# Patient Record
Sex: Female | Born: 1965 | Race: Black or African American | Hispanic: No | Marital: Single | Smoking: Never smoker
Health system: Southern US, Community
[De-identification: ages and names within clinical notes are randomized; demographics above are authoritative.]

## PROBLEM LIST (undated history)

## (undated) DIAGNOSIS — E039 Hypothyroidism, unspecified: Secondary | ICD-10-CM

## (undated) DIAGNOSIS — E119 Type 2 diabetes mellitus without complications: Secondary | ICD-10-CM

## (undated) DIAGNOSIS — E079 Disorder of thyroid, unspecified: Secondary | ICD-10-CM

## (undated) DIAGNOSIS — I639 Cerebral infarction, unspecified: Secondary | ICD-10-CM

## (undated) DIAGNOSIS — I10 Essential (primary) hypertension: Secondary | ICD-10-CM

## (undated) DIAGNOSIS — E785 Hyperlipidemia, unspecified: Secondary | ICD-10-CM

---

## 1998-01-31 ENCOUNTER — Encounter: Admission: RE | Admit: 1998-01-31 | Discharge: 1998-01-31 | Payer: Self-pay | Admitting: Hematology and Oncology

## 1998-03-06 ENCOUNTER — Encounter: Admission: RE | Admit: 1998-03-06 | Discharge: 1998-03-06 | Payer: Self-pay | Admitting: Internal Medicine

## 1998-04-13 ENCOUNTER — Encounter: Admission: RE | Admit: 1998-04-13 | Discharge: 1998-04-13 | Payer: Self-pay | Admitting: Internal Medicine

## 1998-06-24 ENCOUNTER — Emergency Department (HOSPITAL_COMMUNITY): Admission: EM | Admit: 1998-06-24 | Discharge: 1998-06-24 | Payer: Self-pay | Admitting: *Deleted

## 1998-08-15 ENCOUNTER — Encounter: Admission: RE | Admit: 1998-08-15 | Discharge: 1998-08-15 | Payer: Self-pay | Admitting: Internal Medicine

## 1998-11-27 ENCOUNTER — Encounter: Admission: RE | Admit: 1998-11-27 | Discharge: 1998-11-27 | Payer: Self-pay | Admitting: Internal Medicine

## 1998-12-03 ENCOUNTER — Encounter: Admission: RE | Admit: 1998-12-03 | Discharge: 1998-12-03 | Payer: Self-pay | Admitting: Internal Medicine

## 1999-01-14 ENCOUNTER — Encounter: Admission: RE | Admit: 1999-01-14 | Discharge: 1999-01-14 | Payer: Self-pay | Admitting: Internal Medicine

## 1999-01-30 ENCOUNTER — Encounter: Admission: RE | Admit: 1999-01-30 | Discharge: 1999-01-30 | Payer: Self-pay | Admitting: Internal Medicine

## 2000-01-15 ENCOUNTER — Other Ambulatory Visit: Admission: RE | Admit: 2000-01-15 | Discharge: 2000-01-15 | Payer: Self-pay | Admitting: Obstetrics and Gynecology

## 2000-08-18 ENCOUNTER — Encounter: Admission: RE | Admit: 2000-08-18 | Discharge: 2000-08-18 | Payer: Self-pay | Admitting: Internal Medicine

## 2001-02-01 ENCOUNTER — Other Ambulatory Visit: Admission: RE | Admit: 2001-02-01 | Discharge: 2001-02-01 | Payer: Self-pay | Admitting: Obstetrics and Gynecology

## 2001-08-17 ENCOUNTER — Encounter: Payer: Self-pay | Admitting: Emergency Medicine

## 2001-08-17 ENCOUNTER — Emergency Department (HOSPITAL_COMMUNITY): Admission: EM | Admit: 2001-08-17 | Discharge: 2001-08-18 | Payer: Self-pay | Admitting: Emergency Medicine

## 2001-11-09 ENCOUNTER — Encounter: Admission: RE | Admit: 2001-11-09 | Discharge: 2001-11-09 | Payer: Self-pay | Admitting: Internal Medicine

## 2002-05-26 ENCOUNTER — Other Ambulatory Visit: Admission: RE | Admit: 2002-05-26 | Discharge: 2002-05-26 | Payer: Self-pay | Admitting: Obstetrics and Gynecology

## 2002-06-30 ENCOUNTER — Encounter: Payer: Self-pay | Admitting: Emergency Medicine

## 2002-06-30 ENCOUNTER — Emergency Department (HOSPITAL_COMMUNITY): Admission: EM | Admit: 2002-06-30 | Discharge: 2002-06-30 | Payer: Self-pay | Admitting: Emergency Medicine

## 2003-01-17 ENCOUNTER — Ambulatory Visit (HOSPITAL_COMMUNITY): Admission: RE | Admit: 2003-01-17 | Discharge: 2003-01-17 | Payer: Self-pay | Admitting: Family Medicine

## 2003-01-17 ENCOUNTER — Encounter: Payer: Self-pay | Admitting: Family Medicine

## 2003-04-21 ENCOUNTER — Emergency Department (HOSPITAL_COMMUNITY): Admission: EM | Admit: 2003-04-21 | Discharge: 2003-04-22 | Payer: Self-pay | Admitting: Emergency Medicine

## 2003-04-22 ENCOUNTER — Encounter: Payer: Self-pay | Admitting: Emergency Medicine

## 2003-10-10 ENCOUNTER — Other Ambulatory Visit: Admission: RE | Admit: 2003-10-10 | Discharge: 2003-10-10 | Payer: Self-pay | Admitting: Obstetrics and Gynecology

## 2004-03-22 ENCOUNTER — Emergency Department (HOSPITAL_COMMUNITY): Admission: EM | Admit: 2004-03-22 | Discharge: 2004-03-23 | Payer: Self-pay | Admitting: Emergency Medicine

## 2004-10-02 ENCOUNTER — Ambulatory Visit: Payer: Self-pay | Admitting: Internal Medicine

## 2004-10-15 ENCOUNTER — Ambulatory Visit: Payer: Self-pay | Admitting: Internal Medicine

## 2005-09-08 ENCOUNTER — Emergency Department (HOSPITAL_COMMUNITY): Admission: EM | Admit: 2005-09-08 | Discharge: 2005-09-08 | Payer: Self-pay | Admitting: Emergency Medicine

## 2007-03-04 ENCOUNTER — Emergency Department (HOSPITAL_COMMUNITY): Admission: EM | Admit: 2007-03-04 | Discharge: 2007-03-04 | Payer: Self-pay | Admitting: Emergency Medicine

## 2009-03-13 ENCOUNTER — Encounter (HOSPITAL_COMMUNITY): Admission: RE | Admit: 2009-03-13 | Discharge: 2009-05-10 | Payer: Self-pay | Admitting: Internal Medicine

## 2010-12-30 ENCOUNTER — Ambulatory Visit: Payer: Self-pay | Admitting: Dietician

## 2011-01-07 ENCOUNTER — Encounter: Payer: BC Managed Care – PPO | Attending: Family Medicine | Admitting: *Deleted

## 2011-01-07 DIAGNOSIS — Z713 Dietary counseling and surveillance: Secondary | ICD-10-CM | POA: Insufficient documentation

## 2011-01-07 DIAGNOSIS — E119 Type 2 diabetes mellitus without complications: Secondary | ICD-10-CM | POA: Insufficient documentation

## 2011-02-03 ENCOUNTER — Encounter: Payer: BC Managed Care – PPO | Attending: Family Medicine | Admitting: *Deleted

## 2011-02-03 DIAGNOSIS — E119 Type 2 diabetes mellitus without complications: Secondary | ICD-10-CM | POA: Insufficient documentation

## 2011-02-03 DIAGNOSIS — Z713 Dietary counseling and surveillance: Secondary | ICD-10-CM | POA: Insufficient documentation

## 2011-05-26 LAB — URINALYSIS, ROUTINE W REFLEX MICROSCOPIC
Bilirubin Urine: NEGATIVE
Glucose, UA: NEGATIVE
Ketones, ur: NEGATIVE
Nitrite: NEGATIVE
Protein, ur: NEGATIVE
Specific Gravity, Urine: 1.016
Urobilinogen, UA: 1
pH: 5.5

## 2011-05-26 LAB — PREGNANCY, URINE: Preg Test, Ur: NEGATIVE

## 2011-05-26 LAB — URINE MICROSCOPIC-ADD ON

## 2011-08-07 ENCOUNTER — Encounter: Payer: BC Managed Care – PPO | Attending: Family Medicine | Admitting: Dietician

## 2011-08-07 ENCOUNTER — Encounter: Payer: Self-pay | Admitting: Dietician

## 2011-08-07 DIAGNOSIS — E119 Type 2 diabetes mellitus without complications: Secondary | ICD-10-CM | POA: Insufficient documentation

## 2011-08-07 DIAGNOSIS — E039 Hypothyroidism, unspecified: Secondary | ICD-10-CM | POA: Insufficient documentation

## 2011-08-07 DIAGNOSIS — Z713 Dietary counseling and surveillance: Secondary | ICD-10-CM | POA: Insufficient documentation

## 2011-08-07 NOTE — Patient Instructions (Signed)
   Since you have no hypertensive issues, you can use the regular Lance PB crackers.  Try taking the [facility] after meals.   Try to have 1000 to 1200 calories per day.  Carbohydrate 120-125 gm per day.  Protein 80-85 gm per day.  Fat 30-32 gm per day. Based on 1100 calories per day.  Talk to your Aunt's MD regarding seeing a social worker to help with caring for her and getting the assistance of her children.    30 gm of carb at each meal and 15-30 for snacks   Plan to follow-up with Maggie in 8-12 weeks.

## 2011-08-07 NOTE — Progress Notes (Signed)
Medical Nutrition Therapy:  Appt start time: 1500 end time:  1600.  Assessment:  Primary concerns today:  Blood glucose levels are going down but not able to loose weight to help with lowering glucose even more.  Has seen RD at [facility] in the past and has covered carb counting and label reading.  She has been keeping a diary of calories/carbs and limiting herself to a very strict intake of both.  MD is concerned that she is not eating enough to keep her metabolic rate up.   SOCIAL:  Currently is caring for her Aunt (mom's older sister) who has dementia and was left on her door step by the woman's son.  Her four female cousins have essentially left the care of their demented Mother to Zanaria and her mother.  Chenise's dad has had a stroke and his care takes up the majority of her mother's time.  Kamille is currently taking the Aunt to her MD on 08/11/11 and is going to approach the MD as to how they can get a Child psychotherapist involved and perhaps she can help with getting the cooperation of the woman's 4 boys in seeing for her care.  Zaeda is unable to sleep soundly at night for fear that the woman Kain Milosevic leave the house.  Add to this the fact that she is going through menopause and having multiple hot flashes. She is under a great deal of stress which is also helping to limit weight loss and glucose control.  MEDICATIONS:Synthroid and Jentadueto  DIETARY INTAKE:  24-hr recall:  B (6:00 AM): [place] sausage, 1 slice; fried egg, 1; whole wheat toast, 1; coffee, 1/2 cup and water  Snk (10:00 AM) : Pack of organic, low sodium crackers, or peanuts  L (12:00 PM): Baked chicken wing, 1.5-2.0 oz (maybe), broccoli steamed 1 cup; 1/2 tangerine, water  Snk (mid PM): nothing D (6:30 PM): Baked chicken wing, 1.5-2.0 oz; broccoli 1 cup, and pinto beans, 1/4 cup.  Snk (bedto,e PM): none does not eat after 7:00PM Beverages: water (about 32 oz per day) and coffee  This recall is calculated at approximately 675 to 700 calories for  one day.  Her carbs counting the non-starchy veggies is at 75 gm for the day.  Recent physical activity: On her days off when her Mom is looking after her Aunt, she will exercise for 60 minutes in the AM and 60 minutes in the PM.  On work days, it is very hard to find the time to safely exercise.    Estimated energy needs:1000-1200 will aim to try to get her to 1100 calories per day 1000-1200 calories 120-125  g carbohydrates 80-85 g protein 30-32 g fat  Progress Towards Goal(s):  In progress.   Nutritional Diagnosis:  [place]-2.1 Inpaired nutrition utilization As related to glucose.  As evidenced by A1C of 8.5% and continued increased blood glucose and lack of weight loss..    Intervention:  Nutrition Working with her to try to get her caloric and carb intake increased.  This I feel will help with her weight loss.  She is anxious to loose the 10 lb that Dr. Parke Simmers wants her to lose by February.  She is to try the new recommendations and to come and periodically weigh herself here at the center.  Handouts given during visit include:  Novo Nordisk Carbohydrate Counting Guide  Yellow card with the 15-30 gm of carb per meal and 15-30 gm recommendation for snack and the protein list and recommendation for  increasing to 80-85 gm per meal and some with every snack.  Booklet, "Living with Diabetes" by the ADA.  Snack list containing carb and protein sources.  Monitoring/Evaluation:  Dietary intake, exercise, blood glucose levels, and body weight in 8-12 weeks and to call or e-mail in the meantime if she has questions.

## 2011-09-18 ENCOUNTER — Other Ambulatory Visit: Payer: Self-pay | Admitting: Orthopedic Surgery

## 2011-09-18 DIAGNOSIS — M25512 Pain in left shoulder: Secondary | ICD-10-CM

## 2011-09-29 ENCOUNTER — Ambulatory Visit
Admission: RE | Admit: 2011-09-29 | Discharge: 2011-09-29 | Disposition: A | Discharge status: Home / Self Care | Payer: BC Managed Care – PPO | Source: Ambulatory Visit | Attending: Orthopedic Surgery | Admitting: Orthopedic Surgery

## 2011-09-29 DIAGNOSIS — M25512 Pain in left shoulder: Secondary | ICD-10-CM

## 2012-05-31 ENCOUNTER — Encounter (HOSPITAL_COMMUNITY): Payer: Self-pay | Admitting: Family Medicine

## 2012-05-31 ENCOUNTER — Emergency Department (HOSPITAL_COMMUNITY)
Admission: EM | Admit: 2012-05-31 | Discharge: 2012-05-31 | Disposition: A | Discharge status: Home / Self Care | Payer: BC Managed Care – PPO | Attending: Emergency Medicine | Admitting: Emergency Medicine

## 2012-05-31 ENCOUNTER — Emergency Department (HOSPITAL_COMMUNITY): Payer: BC Managed Care – PPO

## 2012-05-31 DIAGNOSIS — R079 Chest pain, unspecified: Secondary | ICD-10-CM | POA: Insufficient documentation

## 2012-05-31 DIAGNOSIS — Z79899 Other long term (current) drug therapy: Secondary | ICD-10-CM | POA: Insufficient documentation

## 2012-05-31 DIAGNOSIS — S0990XA Unspecified injury of head, initial encounter: Secondary | ICD-10-CM

## 2012-05-31 DIAGNOSIS — Y9389 Activity, other specified: Secondary | ICD-10-CM | POA: Insufficient documentation

## 2012-05-31 DIAGNOSIS — E119 Type 2 diabetes mellitus without complications: Secondary | ICD-10-CM | POA: Insufficient documentation

## 2012-05-31 DIAGNOSIS — M25579 Pain in unspecified ankle and joints of unspecified foot: Secondary | ICD-10-CM | POA: Insufficient documentation

## 2012-05-31 DIAGNOSIS — E079 Disorder of thyroid, unspecified: Secondary | ICD-10-CM | POA: Insufficient documentation

## 2012-05-31 HISTORY — DX: Type 2 diabetes mellitus without complications: E11.9

## 2012-05-31 HISTORY — DX: Disorder of thyroid, unspecified: E07.9

## 2012-05-31 LAB — CBC WITH DIFFERENTIAL/PLATELET
Basophils Absolute: 0 10*3/uL (ref 0.0–0.1)
Basophils Relative: 1 % (ref 0–1)
Eosinophils Relative: 3 % (ref 0–5)
HCT: 37 % (ref 36.0–46.0)
Hemoglobin: 12.3 g/dL (ref 12.0–15.0)
MCHC: 33.2 g/dL (ref 30.0–36.0)
MCV: 74.6 fL — ABNORMAL LOW (ref 78.0–100.0)
Monocytes Absolute: 0.4 10*3/uL (ref 0.1–1.0)
Monocytes Relative: 7 % (ref 3–12)
Neutro Abs: 3.3 10*3/uL (ref 1.7–7.7)
RDW: 15.1 % (ref 11.5–15.5)

## 2012-05-31 LAB — COMPREHENSIVE METABOLIC PANEL
Albumin: 3.7 g/dL (ref 3.5–5.2)
BUN: 7 mg/dL (ref 6–23)
CO2: 29 mEq/L (ref 19–32)
Calcium: 9.4 mg/dL (ref 8.4–10.5)
Chloride: 99 mEq/L (ref 96–112)
Creatinine, Ser: 0.79 mg/dL (ref 0.50–1.10)
GFR calc non Af Amer: >90 mL/min (ref >90)
Total Bilirubin: 0.4 mg/dL (ref 0.3–1.2)

## 2012-05-31 LAB — GLUCOSE, CAPILLARY: Glucose-Capillary: 199 mg/dL — ABNORMAL HIGH (ref 70–99)

## 2012-05-31 LAB — TROPONIN I: Troponin I: <0.3 ng/mL (ref <0.30)

## 2012-05-31 MED ORDER — OXYCODONE-ACETAMINOPHEN 5-325 MG PO TABS
1.0000 | ORAL_TABLET | Freq: Once | ORAL | Status: AC
  Administered 2012-05-31: 1 via ORAL
  Filled 2012-05-31: qty 1

## 2012-05-31 MED ORDER — OXYCODONE-ACETAMINOPHEN 5-325 MG PO TABS: 2.0000 | ORAL_TABLET | ORAL | Status: DC | PRN

## 2012-05-31 MED ORDER — IBUPROFEN 800 MG PO TABS
800.0000 mg | ORAL_TABLET | Freq: Once | ORAL | Status: AC
  Administered 2012-05-31: 800 mg via ORAL
  Filled 2012-05-31: qty 1

## 2012-05-31 NOTE — ED Notes (Signed)
Paged ortho 
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
## 2012-05-31 NOTE — Progress Notes (Signed)
Orthopedic Tech Progress Note Patient Details:  Amy Raymond [birth year] [id number]  Ortho Devices Type of Ortho Device: Crutches;Ankle Air splint Ortho Device/Splint Location: right ankle Ortho Device/Splint Interventions: Application   Amy Raymond 05/31/2012, 2:01 PM

## 2012-05-31 NOTE — Progress Notes (Signed)
Orthopedic Tech Progress Note Patient Details:  Amy Raymond 28-Feb-1966 [id number]  Patient ID: Amy Raymond, female   DOB: Dec 19, 1965, 46 y.o.   MRN: [id number] Viewed order from doctor's order list  Nikki Dom 05/31/2012, 2:02 PM
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
## 2014-09-09 ENCOUNTER — Encounter: Payer: Self-pay | Admitting: Internal Medicine

## 2015-03-28 ENCOUNTER — Encounter: Payer: Self-pay | Admitting: Internal Medicine

## 2017-06-03 ENCOUNTER — Other Ambulatory Visit: Payer: Self-pay | Admitting: Nurse Practitioner

## 2017-06-03 DIAGNOSIS — Z780 Asymptomatic menopausal state: Secondary | ICD-10-CM

## 2018-05-11 ENCOUNTER — Other Ambulatory Visit: Payer: Self-pay

## 2019-09-10 ENCOUNTER — Encounter (HOSPITAL_COMMUNITY): Payer: Self-pay

## 2019-09-10 ENCOUNTER — Emergency Department (HOSPITAL_COMMUNITY): Payer: BLUE CROSS/BLUE SHIELD

## 2019-09-10 ENCOUNTER — Inpatient Hospital Stay (HOSPITAL_COMMUNITY)
Admission: EM | Admit: 2019-09-10 | Discharge: 2019-09-13 | DRG: 066 | Disposition: A | Discharge status: Home / Self Care | Payer: BLUE CROSS/BLUE SHIELD | Attending: Internal Medicine | Admitting: Internal Medicine

## 2019-09-10 DIAGNOSIS — R7401 Elevation of levels of liver transaminase levels: Secondary | ICD-10-CM

## 2019-09-10 DIAGNOSIS — Z20822 Contact with and (suspected) exposure to covid-19: Secondary | ICD-10-CM | POA: Diagnosis present

## 2019-09-10 DIAGNOSIS — R748 Abnormal levels of other serum enzymes: Secondary | ICD-10-CM

## 2019-09-10 DIAGNOSIS — E119 Type 2 diabetes mellitus without complications: Secondary | ICD-10-CM

## 2019-09-10 DIAGNOSIS — R4701 Aphasia: Secondary | ICD-10-CM | POA: Diagnosis present

## 2019-09-10 DIAGNOSIS — R519 Headache, unspecified: Secondary | ICD-10-CM | POA: Diagnosis present

## 2019-09-10 DIAGNOSIS — I6340 Cerebral infarction due to embolism of unspecified cerebral artery: Secondary | ICD-10-CM | POA: Diagnosis not present

## 2019-09-10 DIAGNOSIS — Z79899 Other long term (current) drug therapy: Secondary | ICD-10-CM

## 2019-09-10 DIAGNOSIS — D509 Iron deficiency anemia, unspecified: Secondary | ICD-10-CM | POA: Diagnosis present

## 2019-09-10 DIAGNOSIS — Z7984 Long term (current) use of oral hypoglycemic drugs: Secondary | ICD-10-CM

## 2019-09-10 DIAGNOSIS — E785 Hyperlipidemia, unspecified: Secondary | ICD-10-CM | POA: Diagnosis present

## 2019-09-10 DIAGNOSIS — E039 Hypothyroidism, unspecified: Secondary | ICD-10-CM | POA: Diagnosis present

## 2019-09-10 DIAGNOSIS — H543 Unqualified visual loss, both eyes: Secondary | ICD-10-CM | POA: Diagnosis present

## 2019-09-10 DIAGNOSIS — G43109 Migraine with aura, not intractable, without status migrainosus: Secondary | ICD-10-CM | POA: Diagnosis present

## 2019-09-10 DIAGNOSIS — Z79890 Hormone replacement therapy (postmenopausal): Secondary | ICD-10-CM

## 2019-09-10 DIAGNOSIS — E1165 Type 2 diabetes mellitus with hyperglycemia: Secondary | ICD-10-CM | POA: Diagnosis present

## 2019-09-10 DIAGNOSIS — I10 Essential (primary) hypertension: Secondary | ICD-10-CM | POA: Diagnosis present

## 2019-09-10 DIAGNOSIS — I639 Cerebral infarction, unspecified: Secondary | ICD-10-CM | POA: Diagnosis present

## 2019-09-10 LAB — COMPREHENSIVE METABOLIC PANEL
ALT: 105 U/L — ABNORMAL HIGH (ref 0–44)
AST: 139 U/L — ABNORMAL HIGH (ref 15–41)
Albumin: 3.4 g/dL — ABNORMAL LOW (ref 3.5–5.0)
Alkaline Phosphatase: 292 U/L — ABNORMAL HIGH (ref 38–126)
Anion gap: 14 (ref 5–15)
BUN: 10 mg/dL (ref 6–20)
CO2: 23 mmol/L (ref 22–32)
Calcium: 9 mg/dL (ref 8.9–10.3)
Chloride: 100 mmol/L (ref 98–111)
Creatinine, Ser: 0.79 mg/dL (ref 0.44–1.00)
GFR calc Af Amer: >60 mL/min (ref >60)
GFR calc non Af Amer: >60 mL/min (ref >60)
Glucose, Bld: 209 mg/dL — ABNORMAL HIGH (ref 70–99)
Potassium: 5.3 mmol/L — ABNORMAL HIGH (ref 3.5–5.1)
Sodium: 137 mmol/L (ref 135–145)
Total Bilirubin: 1.7 mg/dL — ABNORMAL HIGH (ref 0.3–1.2)
Total Protein: 8 g/dL (ref 6.5–8.1)

## 2019-09-10 LAB — DIFFERENTIAL
Abs Immature Granulocytes: 0.11 10*3/uL — ABNORMAL HIGH (ref 0.00–0.07)
Basophils Absolute: 0 10*3/uL (ref 0.0–0.1)
Basophils Relative: 0 %
Eosinophils Absolute: 0.1 10*3/uL (ref 0.0–0.5)
Eosinophils Relative: 1 %
Immature Granulocytes: 1 %
Lymphocytes Relative: 20 %
Lymphs Abs: 1.7 10*3/uL (ref 0.7–4.0)
Monocytes Absolute: 0.8 10*3/uL (ref 0.1–1.0)
Monocytes Relative: 10 %
Neutro Abs: 5.8 10*3/uL (ref 1.7–7.7)
Neutrophils Relative %: 68 %

## 2019-09-10 LAB — PROTIME-INR
INR: 1 (ref 0.8–1.2)
Prothrombin Time: 13.3 seconds (ref 11.4–15.2)

## 2019-09-10 LAB — CBC
HCT: 34.3 % — ABNORMAL LOW (ref 36.0–46.0)
Hemoglobin: 11 g/dL — ABNORMAL LOW (ref 12.0–15.0)
MCH: 25.1 pg — ABNORMAL LOW (ref 26.0–34.0)
MCHC: 32.1 g/dL (ref 30.0–36.0)
MCV: 78.1 fL — ABNORMAL LOW (ref 80.0–100.0)
Platelets: 462 10*3/uL — ABNORMAL HIGH (ref 150–400)
RBC: 4.39 MIL/uL (ref 3.87–5.11)
RDW: 14.2 % (ref 11.5–15.5)
WBC: 8.5 10*3/uL (ref 4.0–10.5)
nRBC: 0 % (ref 0.0–0.2)

## 2019-09-10 LAB — APTT: aPTT: 28 seconds (ref 24–36)

## 2019-09-10 LAB — I-STAT BETA HCG BLOOD, ED (MC, WL, AP ONLY): I-stat hCG, quantitative: <5 m[IU]/mL (ref <5)

## 2019-09-10 LAB — ETHANOL: Alcohol, Ethyl (B): <10 mg/dL (ref <10)

## 2019-09-10 LAB — CBG MONITORING, ED: Glucose-Capillary: 200 mg/dL — ABNORMAL HIGH (ref 70–99)

## 2019-09-10 MED ORDER — PROCHLORPERAZINE EDISYLATE 10 MG/2ML IJ SOLN
10.0000 mg | Freq: Once | INTRAMUSCULAR | Status: AC
  Administered 2019-09-10: 23:00:00 10 mg via INTRAVENOUS
  Filled 2019-09-10: qty 2

## 2019-09-10 MED ORDER — IOHEXOL 350 MG/ML SOLN
75.0000 mL | Freq: Once | INTRAVENOUS | Status: AC | PRN
  Administered 2019-09-10: 23:00:00 75 mL via INTRAVENOUS

## 2019-09-10 MED ORDER — DIPHENHYDRAMINE HCL 50 MG/ML IJ SOLN
12.5000 mg | Freq: Once | INTRAMUSCULAR | Status: AC
  Administered 2019-09-10: 23:00:00 12.5 mg via INTRAVENOUS
  Filled 2019-09-10: qty 1

## 2019-09-10 NOTE — ED Notes (Signed)
Pt transported to CT ?

## 2019-09-10 NOTE — ED Provider Notes (Signed)
[facility] EMERGENCY DEPARTMENT Provider Note   CSN: [id number] Arrival date & time: 09/10/19  2129     History Chief Complaint  Patient presents with  . Dizziness  . Loss of Consciousness    Amy Raymond is a 54 y.o. female.  HPI   Patient is 54 year old female with history of diabetes, thyroid disease, who presents the emergency department today for evaluation of dizziness, headaches and syncope.  Per patient she had an episode about 5 days ago where she started having a headache.  She lost vision in both eyes, had several episodes of nausea and vomiting, and then reportedly had a syncopal episode and her son found her.    She returned to baseline following this and today had recurrence of her headache.  Headache located to the frontal aspect of her head.  She also reports she has had intermittent burning to her nose and lip tingling.  She had an episode where she was unable to open her mouth or talk.  During this.  She could see and hear but she was unable to speak.  This lasted for several minutes.  Following this she sat up in bed and had an episode of vertigo that lasted about 2 to 3 minutes and resolved spontaneously.  Currently she denies any dizziness.  She complains of a mild headache.  She states her vision is currently normal at this time.  She denies any unilateral numbness/weakness.  Denies any lightheadedness, chest pain, shortness of breath, recent fevers. She denies that she actually had a syncopal episode. History obtained by EMS is somewhat different and pt was reportedly found by her son and was unresponsive.   She has a history of chronic headaches and has seen neurology in the past.  She denies any known history of complex migraines in the past.  Past Medical History:  Diagnosis Date  . Diabetes mellitus without complication ([place])   . Thyroid disease     There are no problems to display for this patient.   History reviewed. No pertinent  surgical history.   OB History   No obstetric history on file.     History reviewed. No pertinent family history.  Social History   Tobacco Use  . Smoking status: Never Smoker  . Smokeless tobacco: Never Used  Substance Use Topics  . Alcohol use: No  . Drug use: No    Home Medications Prior to Admission medications   Medication Sig Start Date End Date Taking? Authorizing Provider  levothyroxine (SYNTHROID) 175 MCG tablet Take 175 mcg by mouth daily. Taking on an empty stomach     [provider]  Linagliptin-Metformin HCl (JENTADUETO) 2.12-998 MG TABS Take 1 tablet by mouth 2 (two) times daily.      [provider]  oxyCODONE-acetaminophen (PERCOCET) 5-325 MG per tablet Take 2 tablets by mouth every 4 (four) hours as needed for pain. 05/31/12   Drenda Freeze, MD    Allergies    Patient has no known allergies.  Review of Systems   Review of Systems  Constitutional: Negative for fever.  HENT: Negative for ear pain and sore throat.   Eyes: Negative for visual disturbance.  Respiratory: Negative for cough and shortness of breath.   Cardiovascular: Negative for chest pain.  Gastrointestinal: Positive for nausea (resolved) and vomiting (resolved). Negative for abdominal pain.  Genitourinary: Negative for dysuria and hematuria.  Musculoskeletal: Negative for back pain.  Skin: Negative for rash.  Neurological: Positive for dizziness (  resolved), speech difficulty (resolved) and headaches. Negative for seizures, syncope, weakness and numbness.  All other systems reviewed and are negative.   Physical Exam Updated Vital Signs BP 130/72   Pulse 80   Temp 99.3 F (37.4 C) (Oral)   Resp 19   SpO2 96%   Physical Exam Vitals and nursing note reviewed.  Constitutional:      General: She is not in acute distress.    Appearance: She is well-developed.  HENT:     Head: Normocephalic and atraumatic.  Eyes:     Extraocular Movements: Extraocular movements  intact.     Conjunctiva/sclera: Conjunctivae normal.     Pupils: Pupils are equal, round, and reactive to light.     Comments: No nystagmus  Cardiovascular:     Rate and Rhythm: Normal rate and regular rhythm.     Pulses: Normal pulses.     Heart sounds: Normal heart sounds. No murmur.  Pulmonary:     Effort: Pulmonary effort is normal. No respiratory distress.     Breath sounds: Normal breath sounds. No wheezing, rhonchi or rales.  Abdominal:     General: Bowel sounds are normal. There is no distension.     Palpations: Abdomen is soft.     Tenderness: There is no abdominal tenderness. There is no guarding or rebound.  Musculoskeletal:     Cervical back: Neck supple.  Skin:    General: Skin is warm and dry.  Neurological:     Mental Status: She is alert.     Comments: Mental Status:  Alert, thought content appropriate, able to give a coherent history. Speech fluent without evidence of aphasia. Able to follow 2 step commands without difficulty.  Cranial Nerves:  II:  Peripheral visual fields grossly normal, pupils equal, round, reactive to light III,IV, VI: ptosis not present, extra-ocular motions intact bilaterally  V,VII: smile symmetric, facial light touch sensation equal VIII: hearing grossly normal to voice  X: uvula elevates symmetrically  XI: bilateral shoulder shrug symmetric and strong XII: midline tongue extension without fassiculations Motor:  Normal tone. 5/5 strength of BUE and BLE major muscle groups including strong and equal grip strength and dorsiflexion/plantar flexion Sensory: light touch normal in all extremities. DTRs: biceps and achilles 2+ symmetric b/l Cerebellar: normal finger-to-nose with bilateral upper extremities Gait: normal gait and balance. Able to walk on toes and heels with ease.  CV: 2+ radial and DP pulses      ED Results / Procedures / Treatments   Labs (all labs ordered are listed, but only abnormal results are displayed) Labs Reviewed   CBC - Abnormal; Notable for the following components:      Result Value   Hemoglobin 11.0 (*)    HCT 34.3 (*)    MCV 78.1 (*)    MCH 25.1 (*)    Platelets 462 (*)    All other components within normal limits  DIFFERENTIAL - Abnormal; Notable for the following components:   Abs Immature Granulocytes 0.11 (*)    All other components within normal limits  COMPREHENSIVE METABOLIC PANEL - Abnormal; Notable for the following components:   Potassium 5.3 (*)    Glucose, Bld 209 (*)    Albumin 3.4 (*)    AST 139 (*)    ALT 105 (*)    Alkaline Phosphatase 292 (*)    Total Bilirubin 1.7 (*)    All other components within normal limits  CBG MONITORING, ED - Abnormal; Notable for the following components:  Glucose-Capillary 200 (*)    All other components within normal limits  ETHANOL  PROTIME-INR  APTT  RAPID URINE DRUG SCREEN, HOSP PERFORMED  URINALYSIS, ROUTINE W REFLEX MICROSCOPIC  I-STAT BETA HCG BLOOD, ED ([facility], [facility], [facility] ONLY)    EKG None  Radiology CT Head Wo Contrast  Result Date: 09/10/2019 CLINICAL DATA:  Found unresponsive. EXAM: CT HEAD WITHOUT CONTRAST TECHNIQUE: Contiguous axial images were obtained from the base of the skull through the vertex without intravenous contrast. COMPARISON:  May 31, 2012 FINDINGS: Brain: No evidence of acute infarction, hemorrhage, hydrocephalus, extra-axial collection or mass lesion/mass effect. Vascular: No hyperdense vessel or unexpected calcification. Skull: Normal. Negative for fracture or focal lesion. Sinuses/Orbits: No acute finding. Other: None. IMPRESSION: No acute intracranial pathology. Electronically Signed   By: Aram Candela M.D.   On: 09/10/2019 22:28    Procedures Procedures (including critical care time)  Medications Ordered in ED Medications  prochlorperazine (COMPAZINE) injection 10 mg (10 mg Intravenous Given 09/10/19 2236)  diphenhydrAMINE (BENADRYL) injection 12.5 mg (12.5 mg Intravenous Given 09/10/19 2237)   iohexol (OMNIPAQUE) 350 MG/ML injection 75 mL (75 mLs Intravenous Contrast Given 09/10/19 2326)    ED Course  I have reviewed the triage vital signs and the nursing notes.  Pertinent labs & imaging results that were available during my care of the patient were reviewed by me and considered in my medical decision making (see chart for details).  Clinical Course as of Jan 31 0002  Sat Sep 10, 2019  2249 EKG 12-Lead [SA]    Clinical Course User Index [SA] Malachi Bonds, [place] Neighbor   MDM Rules/Calculators/A&P                      54 y/o F presenting with headache, an episode of dizziness, NV, and a syncopal episode a few days ago. She also reports intermittent blurred vision and vision loss.   On arrival BP elevated. She is afebrile and has otherwise reassuring VS. Her neuro exam is normal.   CBC with mild anemia, otherwise wnl CMP with elevated AST/ALT at 139/105 with mildly elevated bilirubin, also with mildly elevated K at 5.3  - pt abd exam is completely benign, doubt gallbladder pathology, she denies etoh use, takes on 500mg  tylenol for headaches but no more than that so doubt tylenol toxicity  - pt will need to f/u for repeat labs with pcp coags neg etoh neg UA pending at shift change UDS pending at shift change  EKG with NSR, baseline wander leads in V1  CT head with no acute intracranial pathology.  10:55 PM Discussed case with Dr. who recommends CTA head/neck and MRI brain. If negative, pt can f/u with neurology as an outpatient.   At shift change, care transitioned to Laurence Slate, PA-C with plan to f/u on imaging studies and UA/UDS. If w/u neg pt can f/u with neurology. Ambulatory referral to neurology was given. Pt advised to f/u with PCP for repeat labs next week.    Final Clinical Impression(s) / ED Diagnoses Final diagnoses:  Nonintractable headache, unspecified chronicity pattern, unspecified headache type  Elevated liver enzymes    Rx / DC Orders ED  Discharge Orders    None       Sharilyn Sites 09/11/19 0002    09/13/19, MD 09/11/19 [phone]

## 2019-09-10 NOTE — ED Triage Notes (Signed)
Per family, pt was found unresponsive, FD was able to arouse her, states it was possibly a post ictal state. EMS reports no stroke-like deficits. Pt currenty a/o x4, and has had some nausea, vomiting, dizziness and abdominal discomfort. BG 213. Hx of diabetes, hypothyroid, hypertension.

## 2019-09-11 ENCOUNTER — Emergency Department (HOSPITAL_COMMUNITY): Payer: BLUE CROSS/BLUE SHIELD

## 2019-09-11 ENCOUNTER — Observation Stay (HOSPITAL_COMMUNITY): Payer: BLUE CROSS/BLUE SHIELD

## 2019-09-11 ENCOUNTER — Inpatient Hospital Stay (HOSPITAL_COMMUNITY): Payer: BLUE CROSS/BLUE SHIELD

## 2019-09-11 ENCOUNTER — Other Ambulatory Visit: Payer: Self-pay

## 2019-09-11 DIAGNOSIS — I639 Cerebral infarction, unspecified: Secondary | ICD-10-CM | POA: Diagnosis not present

## 2019-09-11 DIAGNOSIS — R4701 Aphasia: Secondary | ICD-10-CM | POA: Diagnosis present

## 2019-09-11 DIAGNOSIS — R748 Abnormal levels of other serum enzymes: Secondary | ICD-10-CM | POA: Diagnosis present

## 2019-09-11 DIAGNOSIS — E1165 Type 2 diabetes mellitus with hyperglycemia: Secondary | ICD-10-CM | POA: Diagnosis present

## 2019-09-11 DIAGNOSIS — Z79899 Other long term (current) drug therapy: Secondary | ICD-10-CM | POA: Diagnosis not present

## 2019-09-11 DIAGNOSIS — E039 Hypothyroidism, unspecified: Secondary | ICD-10-CM | POA: Diagnosis present

## 2019-09-11 DIAGNOSIS — E785 Hyperlipidemia, unspecified: Secondary | ICD-10-CM | POA: Diagnosis present

## 2019-09-11 DIAGNOSIS — I6389 Other cerebral infarction: Secondary | ICD-10-CM | POA: Diagnosis not present

## 2019-09-11 DIAGNOSIS — Z79890 Hormone replacement therapy (postmenopausal): Secondary | ICD-10-CM | POA: Diagnosis not present

## 2019-09-11 DIAGNOSIS — I10 Essential (primary) hypertension: Secondary | ICD-10-CM | POA: Diagnosis present

## 2019-09-11 DIAGNOSIS — R404 Transient alteration of awareness: Secondary | ICD-10-CM | POA: Diagnosis not present

## 2019-09-11 DIAGNOSIS — Z20822 Contact with and (suspected) exposure to covid-19: Secondary | ICD-10-CM | POA: Diagnosis present

## 2019-09-11 DIAGNOSIS — E119 Type 2 diabetes mellitus without complications: Secondary | ICD-10-CM | POA: Diagnosis not present

## 2019-09-11 DIAGNOSIS — I63433 Cerebral infarction due to embolism of bilateral posterior cerebral arteries: Secondary | ICD-10-CM

## 2019-09-11 DIAGNOSIS — G43109 Migraine with aura, not intractable, without status migrainosus: Secondary | ICD-10-CM | POA: Diagnosis present

## 2019-09-11 DIAGNOSIS — Z7984 Long term (current) use of oral hypoglycemic drugs: Secondary | ICD-10-CM | POA: Diagnosis not present

## 2019-09-11 DIAGNOSIS — I6300 Cerebral infarction due to thrombosis of unspecified precerebral artery: Secondary | ICD-10-CM | POA: Diagnosis not present

## 2019-09-11 DIAGNOSIS — I6340 Cerebral infarction due to embolism of unspecified cerebral artery: Secondary | ICD-10-CM | POA: Diagnosis present

## 2019-09-11 DIAGNOSIS — H543 Unqualified visual loss, both eyes: Secondary | ICD-10-CM | POA: Diagnosis present

## 2019-09-11 DIAGNOSIS — D509 Iron deficiency anemia, unspecified: Secondary | ICD-10-CM | POA: Diagnosis present

## 2019-09-11 DIAGNOSIS — R519 Headache, unspecified: Secondary | ICD-10-CM | POA: Diagnosis present

## 2019-09-11 LAB — HIV ANTIBODY (ROUTINE TESTING W REFLEX): HIV Screen 4th Generation wRfx: NONREACTIVE

## 2019-09-11 LAB — D-DIMER, QUANTITATIVE: D-Dimer, Quant: 1.17 ug/mL-FEU — ABNORMAL HIGH (ref 0.00–0.50)

## 2019-09-11 LAB — GLUCOSE, CAPILLARY
Glucose-Capillary: 158 mg/dL — ABNORMAL HIGH (ref 70–99)
Glucose-Capillary: 176 mg/dL — ABNORMAL HIGH (ref 70–99)
Glucose-Capillary: 150 mg/dL — ABNORMAL HIGH (ref 70–99)
Glucose-Capillary: 223 mg/dL — ABNORMAL HIGH (ref 70–99)

## 2019-09-11 LAB — RESPIRATORY PANEL BY RT PCR (FLU A&B, COVID)
Influenza A by PCR: NEGATIVE
Influenza B by PCR: NEGATIVE
SARS Coronavirus 2 by RT PCR: NEGATIVE

## 2019-09-11 LAB — URINALYSIS, ROUTINE W REFLEX MICROSCOPIC
Bacteria, UA: NONE SEEN
Bilirubin Urine: NEGATIVE
Glucose, UA: NEGATIVE mg/dL
Hgb urine dipstick: NEGATIVE
Ketones, ur: 20 mg/dL — AB
Leukocytes,Ua: NEGATIVE
Nitrite: NEGATIVE
Protein, ur: 30 mg/dL — AB
Specific Gravity, Urine: >1.046 — ABNORMAL HIGH (ref 1.005–1.030)
pH: 6 (ref 5.0–8.0)

## 2019-09-11 LAB — CBC
HCT: 32.6 % — ABNORMAL LOW (ref 36.0–46.0)
Hemoglobin: 10.7 g/dL — ABNORMAL LOW (ref 12.0–15.0)
MCH: 24.9 pg — ABNORMAL LOW (ref 26.0–34.0)
MCHC: 32.8 g/dL (ref 30.0–36.0)
MCV: 75.8 fL — ABNORMAL LOW (ref 80.0–100.0)
Platelets: 607 10*3/uL — ABNORMAL HIGH (ref 150–400)
RBC: 4.3 MIL/uL (ref 3.87–5.11)
RDW: 14 % (ref 11.5–15.5)
WBC: 8.5 10*3/uL (ref 4.0–10.5)
nRBC: 0 % (ref 0.0–0.2)

## 2019-09-11 LAB — BASIC METABOLIC PANEL
Anion gap: 12 (ref 5–15)
BUN: 6 mg/dL (ref 6–20)
CO2: 26 mmol/L (ref 22–32)
Calcium: 9.4 mg/dL (ref 8.9–10.3)
Chloride: 103 mmol/L (ref 98–111)
Creatinine, Ser: 0.69 mg/dL (ref 0.44–1.00)
GFR calc Af Amer: >60 mL/min (ref >60)
GFR calc non Af Amer: >60 mL/min (ref >60)
Glucose, Bld: 177 mg/dL — ABNORMAL HIGH (ref 70–99)
Potassium: 4 mmol/L (ref 3.5–5.1)
Sodium: 141 mmol/L (ref 135–145)

## 2019-09-11 LAB — RAPID URINE DRUG SCREEN, HOSP PERFORMED
Amphetamines: NOT DETECTED
Barbiturates: NOT DETECTED
Benzodiazepines: NOT DETECTED
Cocaine: NOT DETECTED
Opiates: NOT DETECTED
Tetrahydrocannabinol: NOT DETECTED

## 2019-09-11 LAB — PROCALCITONIN: Procalcitonin: <0.1 ng/mL

## 2019-09-11 LAB — HEPATITIS PANEL, ACUTE
HCV Ab: NONREACTIVE
Hep A IgM: NONREACTIVE
Hep B C IgM: NONREACTIVE
Hepatitis B Surface Ag: NONREACTIVE

## 2019-09-11 LAB — C-REACTIVE PROTEIN: CRP: 7.4 mg/dL — ABNORMAL HIGH (ref <1.0)

## 2019-09-11 LAB — HEMOGLOBIN A1C
Hgb A1c MFr Bld: 8.7 % — ABNORMAL HIGH (ref 4.8–5.6)
Mean Plasma Glucose: 202.99 mg/dL

## 2019-09-11 MED ORDER — ACETAMINOPHEN 650 MG RE SUPP: 650.0000 mg | Freq: Four times a day (QID) | RECTAL | Status: DC | PRN

## 2019-09-11 MED ORDER — ATORVASTATIN CALCIUM 40 MG PO TABS
40.0000 mg | ORAL_TABLET | Freq: Every day | ORAL | Status: DC
  Administered 2019-09-12: 40 mg via ORAL
  Filled 2019-09-11: qty 1

## 2019-09-11 MED ORDER — ENOXAPARIN SODIUM 40 MG/0.4ML ~~LOC~~ SOLN
40.0000 mg | SUBCUTANEOUS | Status: DC
  Administered 2019-09-11 – 2019-09-13 (×3): 40 mg via SUBCUTANEOUS
  Filled 2019-09-11 (×3): qty 0.4

## 2019-09-11 MED ORDER — LEVOTHYROXINE SODIUM 75 MCG PO TABS
175.0000 ug | ORAL_TABLET | Freq: Every day | ORAL | Status: DC
  Administered 2019-09-11 – 2019-09-13 (×3): 175 ug via ORAL
  Filled 2019-09-11 (×3): qty 1

## 2019-09-11 MED ORDER — OXYCODONE-ACETAMINOPHEN 5-325 MG PO TABS: 2.0000 | ORAL_TABLET | ORAL | Status: DC | PRN

## 2019-09-11 MED ORDER — LINAGLIPTIN-METFORMIN HCL 2.5-1000 MG PO TABS: 1.0000 | ORAL_TABLET | Freq: Two times a day (BID) | ORAL | Status: DC

## 2019-09-11 MED ORDER — METFORMIN HCL 500 MG PO TABS
1000.0000 mg | ORAL_TABLET | Freq: Two times a day (BID) | ORAL | Status: DC
  Administered 2019-09-11 – 2019-09-13 (×5): 1000 mg via ORAL
  Filled 2019-09-11 (×5): qty 2

## 2019-09-11 MED ORDER — FERROUS SULFATE 325 (65 FE) MG PO TABS
325.0000 mg | ORAL_TABLET | Freq: Two times a day (BID) | ORAL | Status: DC
  Administered 2019-09-11 – 2019-09-13 (×4): 325 mg via ORAL
  Filled 2019-09-11 (×4): qty 1

## 2019-09-11 MED ORDER — CLOPIDOGREL BISULFATE 75 MG PO TABS
300.0000 mg | ORAL_TABLET | Freq: Once | ORAL | Status: AC
  Administered 2019-09-11: 06:00:00 300 mg via ORAL
  Filled 2019-09-11: qty 4

## 2019-09-11 MED ORDER — ASPIRIN EC 81 MG PO TBEC
81.0000 mg | DELAYED_RELEASE_TABLET | Freq: Every day | ORAL | Status: DC
  Administered 2019-09-11 – 2019-09-13 (×3): 81 mg via ORAL
  Filled 2019-09-11 (×3): qty 1

## 2019-09-11 MED ORDER — ACETAMINOPHEN 325 MG PO TABS: 650.0000 mg | ORAL_TABLET | Freq: Four times a day (QID) | ORAL | Status: DC | PRN

## 2019-09-11 MED ORDER — SODIUM CHLORIDE 0.9% FLUSH: 3.0000 mL | INTRAVENOUS | Status: DC | PRN

## 2019-09-11 MED ORDER — CLOPIDOGREL BISULFATE 75 MG PO TABS: 75.0000 mg | ORAL_TABLET | Freq: Every day | ORAL | Status: DC

## 2019-09-11 MED ORDER — ONDANSETRON HCL 4 MG PO TABS: 4.0000 mg | ORAL_TABLET | Freq: Four times a day (QID) | ORAL | Status: DC | PRN

## 2019-09-11 MED ORDER — INSULIN ASPART 100 UNIT/ML ~~LOC~~ SOLN
0.0000 [IU] | Freq: Three times a day (TID) | SUBCUTANEOUS | Status: DC
  Administered 2019-09-11: 07:00:00 3 [IU] via SUBCUTANEOUS
  Administered 2019-09-11: 2 [IU] via SUBCUTANEOUS
  Administered 2019-09-11 – 2019-09-12 (×3): 3 [IU] via SUBCUTANEOUS
  Administered 2019-09-12: 18:00:00 2 [IU] via SUBCUTANEOUS
  Administered 2019-09-13: 06:00:00 3 [IU] via SUBCUTANEOUS

## 2019-09-11 MED ORDER — SODIUM CHLORIDE 0.9% FLUSH
3.0000 mL | Freq: Two times a day (BID) | INTRAVENOUS | Status: DC
  Administered 2019-09-11 – 2019-09-13 (×5): 3 mL via INTRAVENOUS

## 2019-09-11 MED ORDER — SODIUM CHLORIDE 0.9 % IV SOLN: 250.0000 mL | INTRAVENOUS | Status: DC | PRN

## 2019-09-11 MED ORDER — CLOPIDOGREL BISULFATE 75 MG PO TABS
75.0000 mg | ORAL_TABLET | Freq: Every day | ORAL | Status: DC
  Administered 2019-09-12 – 2019-09-13 (×2): 75 mg via ORAL
  Filled 2019-09-11 (×2): qty 1

## 2019-09-11 MED ORDER — LINAGLIPTIN 5 MG PO TABS
5.0000 mg | ORAL_TABLET | Freq: Every day | ORAL | Status: DC
  Administered 2019-09-11 – 2019-09-13 (×3): 5 mg via ORAL
  Filled 2019-09-11 (×3): qty 1

## 2019-09-11 MED ORDER — ONDANSETRON HCL 4 MG/2ML IJ SOLN: 4.0000 mg | Freq: Four times a day (QID) | INTRAMUSCULAR | Status: DC | PRN

## 2019-09-11 NOTE — Progress Notes (Signed)
RN walked in to the room as Ultrasound call to send for patient. Patient was in a daze,  Unable to response,  Eye lids twitching for 3 minutes.  Then regain conscousness.  Temp 98.4, pulse 92 ,BP 150/80 , resp 18 o2 sat 99% RA.

## 2019-09-11 NOTE — Plan of Care (Signed)

## 2019-09-11 NOTE — Consult Note (Signed)
Requesting Physician: Florina Ou PA-C    Chief Complaint: Bilateral vision loss/loss of consciousness  History obtained from: Patient and Chart    HPI:                                                                                                                                       Amy Raymond is a 54 y.o. female with past medical history significant for diabetes mellitus, thyroid disease and migraines with visual aura presents to the emergency room with sudden onset vision loss in both eyes followed by loss of consciousness.  The patient states that around 8 PM on Saturday she was laying in bed and suddenly felt was unable to speak.  She could hear people around her but she was not able to get her words out this lasted about 3 to 4 minutes followed by a sensation of dizziness/lightheadedness and facial paresthesias.  The entire episode lasted not more than 5 minutes and her symptoms resolved.  She had another episode on Sunday where she states that she had sudden onset vision loss in both eyes lasting for about 3 minutes and then resolved spontaneously.  She also feels like her right eye she has some areas where she cannot see as clearly and is scheduled an ophthalmology appointment.    She has been noticing for migraines have returned and she has been having intermittent headaches for the last few weeks.  She used to have migraines several years ago but has been headache free until recently.She denies any history of neck trauma.   Patient is back to her baseline on arrival to [facility], ER  Date last known well: 1 30-21 Time last known well: 8 PM tPA Given: no, resolved symptoms  NIHSS: 0 Baseline MRS    Past Medical History:  Diagnosis Date  . Diabetes mellitus without complication (HCC)   . Thyroid disease     History reviewed. No pertinent surgical history.  History reviewed. No pertinent family history. Social History:  reports that she has never smoked. She has  never used smokeless tobacco. She reports that she does not drink alcohol or use drugs.  Allergies: No Known Allergies  Medications:                                                                                                                        I reviewed home medications  ROS:                                                                                                                                     14 systems reviewed and negative except above    Examination:                                                                                                      General: Appears well-developed  Psych: Affect appropriate to situation Eyes: No scleral injection HENT: No OP obstrucion Head: Normocephalic Cardiovascular: Normal rate and regular rhythm.  Respiratory: Effort normal and breath sounds normal to anterior ascultation GI: Soft.  No distension. There is no tenderness.  Skin: WDI    Neurological Examination Mental Status: Alert, oriented, thought content appropriate.  Speech fluent without evidence of aphasia. Able to follow 3 step commands without difficulty. Cranial Nerves: II: Visual fields grossly normal,  III,IV, VI: ptosis not present, extra-ocular motions intact bilaterally, pupils equal, round, reactive to light and accommodation V,VII: smile symmetric, facial light touch sensation normal bilaterally VIII: hearing normal bilaterally IX,X: uvula rises symmetrically XI: bilateral shoulder shrug XII: midline tongue extension Motor: Right : Upper extremity   5/5    Left:     Upper extremity   5/5  Lower extremity   5/5     Lower extremity   5/5 Tone and bulk:normal tone throughout; no atrophy noted Sensory: Pinprick and light touch intact throughout, bilaterally Deep Tendon Reflexes: 2+ and symmetric throughout Plantars: Right: downgoing   Left: downgoing Cerebellar: normal finger-to-nose, normal rapid alternating movements and normal heel-to-shin  test Gait: normal gait and station     Lab Results: Basic Metabolic Panel: Recent Labs  Lab 09/10/19 2150  NA 137  K 5.3*  CL 100  CO2 23  GLUCOSE 209*  BUN 10  CREATININE 0.79  CALCIUM 9.0    CBC: Recent Labs  Lab 09/10/19 2150  WBC 8.5  NEUTROABS 5.8  HGB 11.0*  HCT 34.3*  MCV 78.1*  PLT 462*    Coagulation Studies: Recent Labs    09/10/19 2150  LABPROT 13.3  INR 1.0    Imaging: CT Angio Head W or Wo Contrast  Result Date: 09/11/2019 CLINICAL DATA:  Initial evaluation for acute headache, syncope. EXAM: CT ANGIOGRAPHY HEAD AND NECK TECHNIQUE: Multidetector CT imaging of the head and neck was performed using the standard protocol during bolus administration of intravenous contrast. Multiplanar CT image reconstructions and MIPs were obtained to evaluate the  vascular anatomy. Carotid stenosis measurements (when applicable) are obtained utilizing NASCET criteria, using the distal internal carotid diameter as the denominator. CONTRAST:  75mL OMNIPAQUE IOHEXOL 350 MG/ML SOLN COMPARISON:  Prior noncontrast head CT from earlier the same day. FINDINGS: CTA NECK FINDINGS Aortic arch: Visualized aortic arch of normal caliber with normal branch pattern. No hemodynamically significant stenosis seen about the origin of the great vessels. Visualized subclavian arteries widely patent. Right carotid system: Right common and internal carotid arteries widely patent without stenosis, dissection, or occlusion. Left carotid system: Mild eccentric soft plaque within the mid left CCA with no more than mild stenosis. Left common and internal carotid arteries otherwise widely patent without stenosis, dissection or occlusion. Vertebral arteries: Both vertebral arteries arise from the subclavian arteries. Right vertebral artery dominant and widely patent within the neck. Left vertebral artery diffusely hypoplastic and largely occluded at its origin. Attenuated and irregular distal reconstitution at  the left V2 segment, with irregular attenuated flow distally within the neck. Left vertebral is grossly patent as it courses into the skull base. Skeleton: No acute osseous abnormality. No discrete or worrisome osseous lesions. Mild cervical spondylosis at C3-4 through C5-6. Other neck: No other acute soft tissue abnormality within the neck. No mass lesion or adenopathy. Upper chest: Multifocal patchy parenchymal opacity seen within the peripheral upper lobes bilaterally, right greater than left, concerning for multifocal pneumonia. Visualized upper chest demonstrates no other acute finding. Review of the MIP images confirms the above findings CTA HEAD FINDINGS Anterior circulation: Petrous segments widely patent. Multifocal atheromatous plaque throughout the cavernous/supraclinoid ICAs bilaterally. Associated up to moderate approximate 50% stenosis at the cavernous right ICA (series 13, image 429). On the left, there is resultant moderate to severe multifocal stenoses (series 13, image 428). A1 segments patent bilaterally. Normal anterior communicating artery. Anterior cerebral arteries patent to their distal aspects without stenosis. No M1 stenosis or occlusion. Normal MCA bifurcations. Distal MCA branches well perfused and symmetric. Posterior circulation: Dominant right vertebral artery patent to the vertebrobasilar junction without stenosis. Patent right PICA. Left vertebral artery attenuated but patent at the skull base, but subsequently occludes, and remains occluded to the vertebrobasilar junction. Left PICA not seen. Moderate diffuse stenosis noted at the mid basilar artery (series 13, image 430). Superior cerebral arteries patent bilaterally. Both PCAs primarily supplied via the basilar and are well perfused to their distal aspects. Small right posterior communicating artery noted. Venous sinuses: Patent. 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 No  appreciable intracranial aneurysm. Review of the MIP images confirms the above findings IMPRESSION: 1. Negative CTA for emergent large vessel occlusion. 2. 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 Dominant right vertebral artery remains widely patent. 3. Additional moderate diffuse atheromatous narrowing of the mid basilar artery. 4. Multifocal atheromatous change throughout the carotid siphons, with associated moderate to severe multifocal narrowing on the left and more moderate narrowing on the right. Both carotid artery systems remain widely patent within the neck. 5. Multifocal parenchymal opacities within the visualized lungs, consistent with multifocal pneumonia. Electronically Signed   By: Rise MuBenjamin  McClintock M.D.   On: 09/11/2019 00:15   CT Head Wo Contrast  Result Date: 09/10/2019 CLINICAL DATA:  Found unresponsive. EXAM: CT HEAD WITHOUT CONTRAST TECHNIQUE: Contiguous axial images were obtained from the base of the skull through the vertex without intravenous contrast. COMPARISON:  May 31, 2012 FINDINGS: Brain:  No evidence of acute infarction, hemorrhage, hydrocephalus, extra-axial collection or mass lesion/mass effect. Vascular: No hyperdense vessel or unexpected calcification. Skull: Normal. Negative for fracture or focal lesion. Sinuses/Orbits: No acute finding. Other: None. IMPRESSION: No acute intracranial pathology. Electronically Signed   By: Aram Candela M.D.   On: 09/10/2019 22:28   CT Angio Neck W and/or Wo Contrast  Result Date: 09/11/2019 CLINICAL DATA:  Initial evaluation for acute headache, syncope. EXAM: CT ANGIOGRAPHY HEAD AND NECK TECHNIQUE: Multidetector CT imaging of the head and neck was performed using the standard protocol during bolus administration of intravenous contrast. Multiplanar CT image reconstructions and MIPs were obtained to  evaluate the vascular anatomy. Carotid stenosis measurements (when applicable) are obtained utilizing NASCET criteria, using the distal internal carotid diameter as the denominator. CONTRAST:  25mL OMNIPAQUE IOHEXOL 350 MG/ML SOLN COMPARISON:  Prior noncontrast head CT from earlier the same day. FINDINGS: CTA NECK FINDINGS Aortic arch: Visualized aortic arch of normal caliber with normal branch pattern. No hemodynamically significant stenosis seen about the origin of the great vessels. Visualized subclavian arteries widely patent. Right carotid system: Right common and internal carotid arteries widely patent without stenosis, dissection, or occlusion. Left carotid system: Mild eccentric soft plaque within the mid left CCA with no more than mild stenosis. Left common and internal carotid arteries otherwise widely patent without stenosis, dissection or occlusion. Vertebral arteries: Both vertebral arteries arise from the subclavian arteries. Right vertebral artery dominant and widely patent within the neck. Left vertebral artery diffusely hypoplastic and largely occluded at its origin. Attenuated and irregular distal reconstitution at the left V2 segment, with irregular attenuated flow distally within the neck. Left vertebral is grossly patent as it courses into the skull base. Skeleton: No acute osseous abnormality. No discrete or worrisome osseous lesions. Mild cervical spondylosis at C3-4 through C5-6. Other neck: No other acute soft tissue abnormality within the neck. No mass lesion or adenopathy. Upper chest: Multifocal patchy parenchymal opacity seen within the peripheral upper lobes bilaterally, right greater than left, concerning for multifocal pneumonia. Visualized upper chest demonstrates no other acute finding. Review of the MIP images confirms the above findings CTA HEAD FINDINGS Anterior circulation: Petrous segments widely patent. Multifocal atheromatous plaque throughout the cavernous/supraclinoid ICAs  bilaterally. Associated up to moderate approximate 50% stenosis at the cavernous right ICA (series 13, image 429). On the left, there is resultant moderate to severe multifocal stenoses (series 13, image 428). A1 segments patent bilaterally. Normal anterior communicating artery. Anterior cerebral arteries patent to their distal aspects without stenosis. No M1 stenosis or occlusion. Normal MCA bifurcations. Distal MCA branches well perfused and symmetric. Posterior circulation: Dominant right vertebral artery patent to the vertebrobasilar junction without stenosis. Patent right PICA. Left vertebral artery attenuated but patent at the skull base, but subsequently occludes, and remains occluded to the vertebrobasilar junction. Left PICA not seen. Moderate diffuse stenosis noted at the mid basilar artery (series 13, image 430). Superior cerebral arteries patent bilaterally. Both PCAs primarily supplied via the basilar and are well perfused to their distal aspects. Small right posterior communicating artery noted. Venous sinuses: Patent. 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 No appreciable intracranial aneurysm. Review of the MIP images confirms the above findings IMPRESSION: 1. Negative CTA for emergent large vessel occlusion. 2. 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 Dominant right vertebral artery remains widely patent. 3. Additional moderate diffuse  atheromatous narrowing of the mid basilar artery. 4. Multifocal atheromatous change throughout the carotid siphons, with associated moderate to severe multifocal narrowing on the left and more moderate narrowing on the right. Both carotid artery systems remain widely patent within the neck. 5. Multifocal parenchymal opacities within the visualized lungs, consistent with multifocal  pneumonia. Electronically Signed   By: Rise Mu M.D.   On: 09/11/2019 00:15     ASSESSMENT AND PLAN  54 y.o. female with past medical history significant for diabetes mellitus, thyroid disease and migraines with visual aura presents to the emergency room with sudden onset inability to get words out followed by dizziness and facial numbness.  She had a previous episode of bilateral vision loss.  The symptoms are concerning for posterior circulation infarcts, which are confirmed on MRI brain.  CT angiogram demonstrated an occluded left vertebral artery with patent basilar artery.  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    Layani Foronda [facility] Pager Number [phone]

## 2019-09-11 NOTE — Evaluation (Signed)
Occupational Therapy Evaluation Patient Details Name: Amy Raymond MRN: [id number] DOB: Oct 17, 1965 Today's Date: 09/11/2019    History of Present Illness Pt is a 54 yo female s/p severe migraine x2 weeks with sudden loss of vision recently. EEG (-) seizures. MRI brain suspicious tiny acute ischemic infarcts with chronic L thalamic lacunar infarct. PMHx: HTN, DMT2.   Clinical Impression   Pt PTA: living alone, independent reports looking for a job. Pt currently with no focal deficits. Vision is WFLs. Pt modified independent with ADL at sink in standing and at commode. Pt able to recall most of "BEFAST" s/s after education. Pt with good immediate recall and after 5 mins. Pt stating "I'm back to normal." HR increasing to 121 BPM after ambulation ~175' and going up/down 2 stairs x2 times, and in/out of tub with modified independence. Pt scoring 23 on DGI with no risk for falls. Pt does not require continued OT skilled services. OT signing off.    Follow Up Recommendations  No OT follow up    Equipment Recommendations  None recommended by OT    Recommendations for Other Services       Precautions / Restrictions Precautions Precautions: None Restrictions Weight Bearing Restrictions: No      Mobility Bed Mobility Overal bed mobility: Modified Independent                Transfers Overall transfer level: Modified independent                    Balance Overall balance assessment: Needs assistance   Sitting balance-Leahy Scale: Good       Standing balance-Leahy Scale: Fair Standing balance comment: LOB, able to right self after applying socks on in standing and not holding onto a surface x2 times                 Standardized Balance Assessment Standardized Balance Assessment : Dynamic Gait Index   Dynamic Gait Index Level Surface: Normal Change in Gait Speed: Normal Gait with Horizontal Head Turns: Normal Gait with Vertical Head Turns: Normal Gait  and Pivot Turn: Mild Impairment Step Over Obstacle: Normal Step Around Obstacles: Normal Steps: Normal Total Score: 23     ADL either performed or assessed with clinical judgement   ADL Overall ADL's : Modified independent                                             Vision Baseline Vision/History: No visual deficits;Wears glasses Wears Glasses: At all times Patient Visual Report: No change from baseline Vision Assessment?: Yes Eye Alignment: Within Functional Limits Ocular Range of Motion: Within Functional Limits Alignment/Gaze Preference: Within Defined Limits Additional Comments: Reading menu and working her phone well     Perception     Praxis      Pertinent Vitals/Pain Pain Assessment: Faces Faces Pain Scale: No hurt     Hand Dominance Right   Extremity/Trunk Assessment Upper Extremity Assessment Upper Extremity Assessment: Overall WFL for tasks assessed   Lower Extremity Assessment Lower Extremity Assessment: Overall WFL for tasks assessed   Cervical / Trunk Assessment Cervical / Trunk Assessment: Normal   Communication Communication Communication: No difficulties   Cognition Arousal/Alertness: Awake/alert Behavior During Therapy: WFL for tasks assessed/performed Overall Cognitive Status: Within Functional Limits for tasks assessed  General Comments  Pt able to recall most of "BEFAST" s/s after education. Pt stating "I'm back to normal." HR increasing to 121 BPM after ambulation ~175' and going up/down 2 stairs x2 times, and in/out of tub.     Exercises     Shoulder Instructions      Home Living Family/patient expects to be discharged to:: Private residence Living Arrangements: Alone Available Help at Discharge: Family;Available PRN/intermittently Type of Home: House Home Access: Stairs to enter [employer] of Steps: 3   Home Layout: One level     Bathroom  Shower/Tub: Teacher, early years/pre: Standard         Additional Comments: Son in [employer]      Prior Functioning/Environment Level of Independence: Independent        Comments: Driving        OT Problem List:        OT Treatment/Interventions:      OT Goals(Current goals can be found in the care plan section)    OT Frequency:     Barriers to D/C:            Co-evaluation              AM-PAC OT "6 Clicks" Daily Activity     Outcome Measure Help from another person eating meals?: None Help from another person taking care of personal grooming?: None Help from another person toileting, which includes using toliet, bedpan, or urinal?: None Help from another person bathing (including washing, rinsing, drying)?: None Help from another person to put on and taking off regular upper body clothing?: None Help from another person to put on and taking off regular lower body clothing?: None 6 Click Score: 24   End of Session Equipment Utilized During Treatment: Gait belt Nurse Communication: Mobility status  Activity Tolerance: Patient tolerated treatment well Patient left: in chair;with call bell/phone within reach  OT Visit Diagnosis: Unsteadiness on feet (R26.81)                Time: 1421-1440 OT Time Calculation (min): 19 min Charges:  OT General Charges $OT Visit: 1 Visit OT Evaluation $OT Eval Moderate Complexity: Clarinda C OTR/L Acute Rehabilitation Services Pager: [phone] Office: [phone]   Deyani Hegarty C 09/11/2019, 3:58 PM

## 2019-09-11 NOTE — Progress Notes (Signed)
  Echocardiogram 2D Echocardiogram has been performed.  Celene Skeen 09/11/2019, 2:32 PM

## 2019-09-11 NOTE — ED Notes (Signed)
Pt transported to MRI 

## 2019-09-11 NOTE — Progress Notes (Signed)
STROKE TEAM PROGRESS NOTE   INTERVAL HISTORY Her RN is at the bedside.  Patient awake alert, fully orientated.  She recounted HPI with me.  She was lying in bed watching TV last night, she asked her son to get her some potato soup, when she tried to get up drinking soup, she felt room spinning and vertigo, she laid back down, felt not able to see from her right eye which she had intermittently for some time.  She also had a difficulty talking although she was able to hear her son talking to her, denies any loss of consciousness.  She also felt numbness and tingling around her mouth.  Episode lasted about 3 to 5 minutes and resolved.  She came to ER for evaluation.  Early this morning in the unit, she felt hungry and was altering her breakfast from the phone, she felt again had difficulty speaking with mouth tingling.  She denies any loss of consciousness or vision changes.  As per nursing note and Dr. Roxine Caddy note, patient had episode of days off, not talking not answering questions, eyelid twitching for 3 minutes and resolved.  Then she had second episode of blank stare, not answer questions, later slurry speech for 3 minutes.  Followed by crying, tearfulness, emotional changes, stating " I need my mama".  EEG normal.  On my examination, patient awake alert, orientated, pleasant, laughing and joking.  She felt she is back to normal and she wants to go home.  However, MRI showed several punctate infarct at bilateral parieto-occipital regions.  OBJECTIVE Vitals:   09/11/19 0556 09/11/19 0728 09/11/19 0732 09/11/19 0930  BP: (!) 143/78 135/77 (!) 159/80 (!) 149/71  Pulse: 86 92 86 91  Resp: 18 18 18 16   Temp: 98.5 F (36.9 C) 98.4 F (36.9 C) 98.4 F (36.9 C) 98.2 F (36.8 C)  TempSrc: Oral Oral Oral Oral  SpO2: 98% 99% 97% 100%    CBC:  Recent Labs  Lab 09/10/19 2150 09/11/19 0659  WBC 8.5 8.5  NEUTROABS 5.8  --   HGB 11.0* 10.7*  HCT 34.3* 32.6*  MCV 78.1* 75.8*  PLT 462* 607*     Basic Metabolic Panel:  Recent Labs  Lab 09/10/19 2150 09/11/19 0659  NA 137 141  K 5.3* 4.0  CL 100 103  CO2 23 26  GLUCOSE 209* 177*  BUN 10 6  CREATININE 0.79 0.69  CALCIUM 9.0 9.4    Lipid Panel: No results found for: CHOL, TRIG, HDL, CHOLHDL, VLDL, LDLCALC HgbA1c:  Lab Results  Component Value Date   HGBA1C 8.7 (H) 09/11/2019   Urine Drug Screen:     Component Value Date/Time   LABOPIA NONE DETECTED 09/11/2019 0319   COCAINSCRNUR NONE DETECTED 09/11/2019 0319   LABBENZ NONE DETECTED 09/11/2019 0319   AMPHETMU NONE DETECTED 09/11/2019 0319   THCU NONE DETECTED 09/11/2019 0319   LABBARB NONE DETECTED 09/11/2019 0319    Alcohol Level     Component Value Date/Time   ETH <10 09/10/2019 2150    IMAGING  CT Angio Head W or Wo Contrast CT Angio Neck W and/or Wo Contrast 09/11/2019 IMPRESSION:  1. Negative CTA for emergent large vessel occlusion.  2. 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 Dominant right vertebral artery remains widely patent.  3. Additional moderate diffuse atheromatous narrowing of the mid basilar artery.  4. Multifocal atheromatous change throughout the carotid siphons,  with associated moderate to severe multifocal narrowing on the left and more moderate narrowing on the right. Both carotid artery systems remain widely patent within the neck.  5. Multifocal parenchymal opacities within the visualized lungs, consistent with multifocal pneumonia.   CT Head Wo Contrast 09/10/2019 IMPRESSION:  No acute intracranial pathology.   CT CHEST WO CONTRAST 09/11/2019 IMPRESSION:  Widely dispersed patchy pulmonary infiltrates typical of those seen in coronavirus infection. No lobar consolidation or collapse. Multiple nonobstructing renal calculi. Probable gallstones. Aortic atherosclerosis.  Coronary artery  calcification.  MR BRAIN WO CONTRAST 09/11/2019 IMPRESSION:  1. Few tiny punctate foci of diffusion abnormality involving the parieto-occipital regions bilaterally as above, suspicious for tiny acute ischemic infarcts given patient's symptoms. No associated hemorrhage or mass effect.  2. Underlying age-related cerebral atrophy with chronic microvascular ischemic disease, with additional chronic left thalamic lacunar infarct.   DG Chest Port 1 View 09/11/2019 IMPRESSION:  Patchy bilateral airspace opacities most compatible with history of COVID-19.   US Abdomen Limited RUQ 09/11/2019 IMPRESSION:  1. Single 1.6 cm mobile gallstone. No additional findings to suggest acute cholecystitis.  2. Suggestion of mild hepatic steatosis.  No focal mass.    Transthoracic Echocardiogram  1. Left ventricular ejection fraction, by visual estimation, is 60 to  65%. The left ventricle has normal function. There is mildly increased  left ventricular hypertrophy.  2. The left ventricle has no regional wall motion abnormalities.  3. Global right ventricle has normal systolic function.The right  ventricular size is normal. No increase in right ventricular wall  thickness.  4. Left atrial size was normal.  5. Right atrial size was normal.  6. The mitral valve is abnormal. Trivial mitral valve regurgitation.  7. The tricuspid valve is normal in structure.  8. The tricuspid valve is normal in structure. Tricuspid valve  regurgitation is trivial.  9. The aortic valve is tricuspid. Aortic valve regurgitation is not  visualized. Mild aortic valve sclerosis without stenosis.  10. Pulmonic regurgitation is mild.  11. The pulmonic valve was normal in structure. Pulmonic valve  regurgitation is mild.  12. The inferior vena cava is normal in size with greater than 50%  respiratory variability, suggesting right atrial pressure of 3 mmHg.  ECG - SR rate 91 BPM. (See cardiology reading for complete  details)  EEG - This study is within normal limits. No seizures or epileptiform discharges were seen throughout the recording.   PHYSICAL EXAM  Temp:  [98.2 F (36.8 C)-99.5 F (37.5 C)] 98.5 F (36.9 C) (01/31 1500) Pulse Rate:  [72-98] 98 (01/31 1500) Resp:  [9-24] 18 (01/31 1500) BP: (103-173)/(51-111) 114/61 (01/31 1500) SpO2:  [95 %-100 %] 99 % (01/31 1500) Weight:  [77.1 kg] 77.1 kg (01/31 1400)  General - Well nourished, well developed, in no apparent distress.  Ophthalmologic - fundi not visualized due to noncooperation.  Cardiovascular - Regular rhythm and rate.  Mental Status -  Level of arousal and orientation to time, place, and person were intact. Language including expression, naming, repetition, comprehension was assessed and found intact. Fund of Knowledge was assessed and was intact.  Cranial Nerves II - XII - II - Visual field intact OU. III, IV, VI - Extraocular movements intact. V - Facial sensation intact bilaterally. VII - Facial movement intact bilaterally. VIII - Hearing & vestibular intact bilaterally. X - Palate elevates symmetrically. XI - Chin turning & shoulder shrug intact bilaterally. XII - Tongue protrusion intact.  Motor Strength - The patient's strength was normal  in all extremities and pronator drift was absent.  Bulk was normal and fasciculations were absent.   Motor Tone - Muscle tone was assessed at the neck and appendages and was normal.  Reflexes - The patient's reflexes were symmetrical in all extremities and she had no pathological reflexes.  Sensory - Light touch, temperature/pinprick were assessed and were symmetrical.    Coordination - The patient had normal movements in the hands and feet with no ataxia or dysmetria.  Tremor was absent.  Gait and Station - deferred.   ASSESSMENT/PLAN Ms. BERNARD DONAHOO is a 54 y.o. female with history of diabetes mellitus, thyroid disease and migraines with visual aura presents to the  emergency room with recent transient episodes of inability to speak and sudden onset vision loss in both eyes followed by loss of consciousness.  She did not receive IV t-PA due to resolution of deficits.   Stroke: tiny punctate foci of diffusion abnormality involving the parieto-occipital regions bilaterally - embolic - unknown source   CT head  - no acute findings.  MRI head - Few tiny punctate foci of diffusion abnormality involving the parieto-occipital regions bilaterally as above, suspicious for tiny acute ischemic infarcts. Underlying age-related cerebral atrophy with chronic microvascular ischemic disease, with additional chronic left thalamic lacunar infarct.   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 Moderate diffuse atheromatous narrowing of the mid basilar artery.   EEG normal EEG   LE venous doppler pending  TCD bubble study pending  2D Echo EF 60-65%  May consider 30 day cardiac monitoring if above work up unrevealing.  [employer] Virus 2 - negative  LDL - pending  HgbA1c - 8.7  UDS - negative  VTE prophylaxis - Lovenox  No antithrombotic prior to admission, now on aspirin 81 mg daily and clopidogrel 75 mg daily  Patient counseled to be compliant with her antithrombotic medications  Ongoing aggressive stroke risk factor management  Therapy recommendations:  pending  Disposition:  Pending  Hypertension  Home BP meds: none   Current BP meds: none   BP 170s on presentation, now stable . Long-term BP goal normotensive  Hyperlipidemia  Home Lipid lowering medication: none   LDL - pending, goal < 70  Current lipid lowering medication: Lipitor 40 mg daily   Continue statin at discharge  Diabetes  Home diabetic meds: Jentadueto and glucatrol  Current diabetic meds: insulin, linagliptin and  metformin  HgbA1c 8.7, goal < 7.0  hyperglycemia   Close PCP follow up for better DM control  Other Stroke Risk Factors  Obesity, recommend weight loss, diet and exercise as appropriate   Migraines  Other Active Problems  Code status - Full code  Iron deficiency anemia - Hb - 10.7 - resume iron pills  Chronic left thalamic lacunar infarct by MRI  Hospital day # 0  Rosalin Hawking, MD PhD Stroke Neurology 09/11/2019 5:00 PM   To contact Stroke Continuity provider, please refer to [email or web address] After hours, contact General Neurology

## 2019-09-11 NOTE — Plan of Care (Signed)
Called by RN at bedside. Patient slow to respond, started crying and blankly staring. Upon my arrival, sh is slow to respond. Speech is slow but not dysarthric. She is very tearful, had a very sad affect. Said "I need my mama". Participated in the full exam with me with no focal deficits observed. NIHSS remains 0.  Recs: In addition to the earlier recs provided by Dr. Laurence Slate, consider doing an EEG to r/o any underlying seizures although clinically did not appear to be a seizure, she doees have a h/o migraines and has cortical infarcts.  Stroke team to follow.  -- Milon Dikes, MD [facility]

## 2019-09-11 NOTE — Progress Notes (Signed)
EEG complete - results pending 

## 2019-09-11 NOTE — Progress Notes (Signed)
Attempted echo.  Patient having an EEG done

## 2019-09-11 NOTE — Procedures (Signed)
Patient Name: Amy Raymond  MRN: [id number]  Epilepsy Attending: Charlsie Quest  Referring Physician/Provider: Dr Milon Dikes Date: 09/11/2019  Duration: 23.34 mins  Patient history: 54 y.o. female with past medical history significant for diabetes mellitus, thyroid disease and migraines with visual aura presents to the emergency room with sudden onset inability to get words out followed by dizziness and facial numbness.  She had a previous episode of bilateral vision loss. Then had an episode of staring spell. EEG to assess for seizure.  Level of alertness: Awake  AEDs during EEG study: None  Technical aspects: This EEG study was done with scalp electrodes positioned according to the 10-20 International system of electrode placement. Electrical activity was acquired at a sampling rate of 500Hz  and reviewed with a high frequency filter of 70Hz  and a low frequency filter of 1Hz . EEG data were recorded continuously and digitally stored.   DESCRIPTION: The posterior dominant rhythm consists of 9-10 Hz activity of moderate voltage (25-35 uV) seen predominantly in posterior head regions, symmetric and reactive to eye opening and eye closing. Physiologic photic driving was seen during photic stimulation. Hyperventilation was not performed due to acute CVA.  IMPRESSION: This study is within normal limits. No seizures or epileptiform discharges were seen throughout the recording.  Rick Warnick 

## 2019-09-11 NOTE — Progress Notes (Signed)
Patient had another episode of blank stare,  Slow to reapond, Slurring of speech for 3 minutes.  Got emotinal when fully conscious.  puse 92,  BP 135/77  O2 sat 100 on RA.

## 2019-09-11 NOTE — H&P (Addendum)
History and Physical    Amy Raymond DJS:[id number] DOB: [birth year] DOA: 09/10/2019  PCP: [place], Julie M, NP    Patient coming from: Home    Chief Complaint: Severe headache for the last 2 weeks with visual lower and presented to the emergency room with sudden onset vision loss in both eyes followed by loss of consciousness   HPI: Amy Raymond is a 54 y.o. female with medical history significant of hypertension, diabetes mellitus type 2 came with a chief complaint of severe headaches for the last 2 weeks and started and onset of vision loss in both eyes. Patient states last Saturday was lying in bed and was not able to speak, had nausea and vomiting was not able to get her words for 3 to 4 minutes.  She had another episode Sunday when she states she lost her vision in both eyes for 3 minutes.  All the symptoms resolved spontaneously. Her headaches are more severe in the last 2 weeks.  She used to have migraines several years ago but not headaches until recently.  ED Course:  In the emergency room Had a CT angio1. Negative CTA for emergent large vessel occlusion. 2. 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 Dominant right vertebral artery remains widely patent. 3. Additional moderate diffuse atheromatous narrowing of the mid basilar artery. 4. Multifocal atheromatous change throughout the carotid siphons, with associated moderate to severe multifocal narrowing on the left and more moderate narrowing on the right. Both carotid artery systems remain widely patent within the neck. 5. 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  With exception of headaches, lost vision, dizziness  Past Medical History:  Diagnosis Date  . Diabetes mellitus without complication ([place])   . Thyroid disease     History reviewed. No pertinent surgical history.   reports that she has never smoked. She has never used smokeless tobacco. She reports that she does not drink alcohol or use drugs.  No Known Allergies  History reviewed. No pertinent family history.   Prior to Admission medications   Medication Sig Start Date End Date Taking? Authorizing Provider  cholecalciferol (VITAMIN D3) 25 MCG (1000 UNIT) tablet Take 1,000 Units by mouth daily.   Yes [provider]  ferrous sulfate 325 (65 FE) MG tablet Take 325 mg by mouth daily with breakfast.   Yes [provider]  glipiZIDE (GLUCOTROL) 10 MG tablet Take 10 mg by mouth 2 (two) times daily. 08/22/19  Yes [provider]  levothyroxine (SYNTHROID) 137 MCG tablet Take 137 mcg by mouth daily. Taking on an empty stomach    Yes [provider]  Linagliptin-Metformin HCl (JENTADUETO) 2.12-998 MG TABS Take 1 tablet by mouth 2 (two) times daily.     Yes [provider]    Physical Exam: Vitals:   09/11/19 0415 09/11/19 0430 09/11/19 0445 09/11/19 0500  BP: (!) 121/51 (!) 127/58 (!) 145/64 (!) 156/82  Pulse: 75 90 92 85  Resp: (!) 21 18 11 20   Temp:      TempSrc:      SpO2: 96% 99% 99% 98%    Constitutional: NAD, calm, comfortable Vitals:   09/11/19 0415 09/11/19  0430 09/11/19 0445 09/11/19 0500  BP: (!) 121/51 (!) 127/58 (!) 145/64 (!) 156/82  Pulse: 75 90 92 85  Resp: (!) 21 18 11 20   Temp:      TempSrc:      SpO2: 96% 99% 99% 98%   Eyes: PERRL, lids and conjunctivae normal ENMT: Mucous membranes are moist. Posterior pharynx clear of any exudate or lesions.Normal dentition.  Neck: normal, supple, no masses, no thyromegaly Respiratory: clear to auscultation bilaterally, no  wheezing, no crackles. Normal respiratory effort. No accessory muscle use.  Cardiovascular: Regular rate and rhythm, no murmurs / rubs / gallops. No extremity edema. 2+ pedal pulses. No carotid bruits.  Abdomen: no tenderness, no masses palpated. No hepatosplenomegaly. Bowel sounds positive.  Musculoskeletal: no clubbing / cyanosis. No joint deformity upper and lower extremities. Good ROM, no contractures. Normal muscle tone.  Skin: no rashes, lesions, ulcers. No induration Neurologic: CN 2-12 grossly intact. Sensation intact, DTR normal. Strength 5/5 in all 4.  Psychiatric: Normal judgment and insight. Alert and oriented x 3. Normal mood.    Labs on Admission: I have personally reviewed following labs and imaging studies  CBC: Recent Labs  Lab 09/10/19 2150  WBC 8.5  NEUTROABS 5.8  HGB 11.0*  HCT 34.3*  MCV 78.1*  PLT 462*   Basic Metabolic Panel: Recent Labs  Lab 09/10/19 2150  NA 137  K 5.3*  CL 100  CO2 23  GLUCOSE 209*  BUN 10  CREATININE 0.79  CALCIUM 9.0   GFR: CrCl cannot be calculated (Unknown ideal weight.). Liver Function Tests: Recent Labs  Lab 09/10/19 2150  AST 139*  ALT 105*  ALKPHOS 292*  BILITOT 1.7*  PROT 8.0  ALBUMIN 3.4*   No results for input(s): LIPASE, AMYLASE in the last 168 hours. No results for input(s): AMMONIA in the last 168 hours. Coagulation Profile: Recent Labs  Lab 09/10/19 2150  INR 1.0   Cardiac Enzymes: No results for input(s): CKTOTAL, CKMB, CKMBINDEX, TROPONINI in the last 168 hours. BNP (last 3 results) No results for input(s): PROBNP in the last 8760 hours. HbA1C: No results for input(s): HGBA1C in the last 72 hours. CBG: Recent Labs  Lab 09/10/19 2148  GLUCAP 200*   Lipid Profile: No results for input(s): CHOL, HDL, LDLCALC, TRIG, CHOLHDL, LDLDIRECT in the last 72 hours. Thyroid Function Tests: No results for input(s): TSH, T4TOTAL, FREET4, T3FREE, THYROIDAB in the last 72 hours. Anemia Panel: No results  for input(s): VITAMINB12, FOLATE, FERRITIN, TIBC, IRON, RETICCTPCT in the last 72 hours. Urine analysis:    Component Value Date/Time   COLORURINE YELLOW 09/11/2019 0319   APPEARANCEUR CLEAR 09/11/2019 0319   LABSPEC >1.046 (H) 09/11/2019 0319   PHURINE 6.0 09/11/2019 0319   GLUCOSEU NEGATIVE 09/11/2019 0319   HGBUR NEGATIVE 09/11/2019 0319   BILIRUBINUR NEGATIVE 09/11/2019 0319   KETONESUR 20 (A) 09/11/2019 0319   PROTEINUR 30 (A) 09/11/2019 0319   UROBILINOGEN 1.0 03/04/2007 1518   NITRITE NEGATIVE 09/11/2019 0319   LEUKOCYTESUR NEGATIVE 09/11/2019 0319    Radiological Exams on Admission: CT Angio Head W or Wo Contrast  Result Date: 09/11/2019 CLINICAL DATA:  Initial evaluation for acute headache, syncope. EXAM: CT ANGIOGRAPHY HEAD AND NECK TECHNIQUE: Multidetector CT imaging of the head and neck was performed using the standard protocol during bolus administration of intravenous contrast. Multiplanar CT image reconstructions and MIPs were obtained to evaluate the vascular anatomy. Carotid stenosis measurements (when applicable) are obtained utilizing NASCET criteria, using the distal internal carotid diameter  as the denominator. CONTRAST:  75mL OMNIPAQUE IOHEXOL 350 MG/ML SOLN COMPARISON:  Prior noncontrast head CT from earlier the same day. FINDINGS: CTA NECK FINDINGS Aortic arch: Visualized aortic arch of normal caliber with normal branch pattern. No hemodynamically significant stenosis seen about the origin of the great vessels. Visualized subclavian arteries widely patent. Right carotid system: Right common and internal carotid arteries widely patent without stenosis, dissection, or occlusion. Left carotid system: Mild eccentric soft plaque within the mid left CCA with no more than mild stenosis. Left common and internal carotid arteries otherwise widely patent without stenosis, dissection or occlusion. Vertebral arteries: Both vertebral arteries arise from the subclavian arteries. Right  vertebral artery dominant and widely patent within the neck. Left vertebral artery diffusely hypoplastic and largely occluded at its origin. Attenuated and irregular distal reconstitution at the left V2 segment, with irregular attenuated flow distally within the neck. Left vertebral is grossly patent as it courses into the skull base. Skeleton: No acute osseous abnormality. No discrete or worrisome osseous lesions. Mild cervical spondylosis at C3-4 through C5-6. Other neck: No other acute soft tissue abnormality within the neck. No mass lesion or adenopathy. Upper chest: Multifocal patchy parenchymal opacity seen within the peripheral upper lobes bilaterally, right greater than left, concerning for multifocal pneumonia. Visualized upper chest demonstrates no other acute finding. Review of the MIP images confirms the above findings CTA HEAD FINDINGS Anterior circulation: Petrous segments widely patent. Multifocal atheromatous plaque throughout the cavernous/supraclinoid ICAs bilaterally. Associated up to moderate approximate 50% stenosis at the cavernous right ICA (series 13, image 429). On the left, there is resultant moderate to severe multifocal stenoses (series 13, image 428). A1 segments patent bilaterally. Normal anterior communicating artery. Anterior cerebral arteries patent to their distal aspects without stenosis. No M1 stenosis or occlusion. Normal MCA bifurcations. Distal MCA branches well perfused and symmetric. Posterior circulation: Dominant right vertebral artery patent to the vertebrobasilar junction without stenosis. Patent right PICA. Left vertebral artery attenuated but patent at the skull base, but subsequently occludes, and remains occluded to the vertebrobasilar junction. Left PICA not seen. Moderate diffuse stenosis noted at the mid basilar artery (series 13, image 430). Superior cerebral arteries patent bilaterally. Both PCAs primarily supplied via the basilar and are well perfused to their  distal aspects. Small right posterior communicating artery noted. Venous sinuses: Patent. 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 No appreciable intracranial aneurysm. Review of the MIP images confirms the above findings IMPRESSION: 1. Negative CTA for emergent large vessel occlusion. 2. 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 Dominant right vertebral artery remains widely patent. 3. Additional moderate diffuse atheromatous narrowing of the mid basilar artery. 4. Multifocal atheromatous change throughout the carotid siphons, with associated moderate to severe multifocal narrowing on the left and more moderate narrowing on the right. Both carotid artery systems remain widely patent within the neck. 5. Multifocal parenchymal opacities within the visualized lungs, consistent with multifocal pneumonia. Electronically Signed   By: Rise MuBenjamin  McClintock M.D.   On: 09/11/2019 00:15   CT Head Wo Contrast  Result Date: 09/10/2019 CLINICAL DATA:  Found unresponsive. EXAM: CT HEAD WITHOUT CONTRAST TECHNIQUE: Contiguous axial images were obtained from the base of the skull through the vertex without intravenous contrast. COMPARISON:  May 31, 2012 FINDINGS: Brain: No evidence of acute infarction, hemorrhage, hydrocephalus, extra-axial collection or mass lesion/mass effect. Vascular: No hyperdense vessel or  unexpected calcification. Skull: Normal. Negative for fracture or focal lesion. Sinuses/Orbits: No acute finding. Other: None. IMPRESSION: No acute intracranial pathology. Electronically Signed   By: Aram Candela M.D.   On: 09/10/2019 22:28   CT Angio Neck W and/or Wo Contrast  Result Date: 09/11/2019 CLINICAL DATA:  Initial evaluation for acute headache, syncope. EXAM: CT ANGIOGRAPHY HEAD AND NECK TECHNIQUE: Multidetector CT  imaging of the head and neck was performed using the standard protocol during bolus administration of intravenous contrast. Multiplanar CT image reconstructions and MIPs were obtained to evaluate the vascular anatomy. Carotid stenosis measurements (when applicable) are obtained utilizing NASCET criteria, using the distal internal carotid diameter as the denominator. CONTRAST:  110mL OMNIPAQUE IOHEXOL 350 MG/ML SOLN COMPARISON:  Prior noncontrast head CT from earlier the same day. FINDINGS: CTA NECK FINDINGS Aortic arch: Visualized aortic arch of normal caliber with normal branch pattern. No hemodynamically significant stenosis seen about the origin of the great vessels. Visualized subclavian arteries widely patent. Right carotid system: Right common and internal carotid arteries widely patent without stenosis, dissection, or occlusion. Left carotid system: Mild eccentric soft plaque within the mid left CCA with no more than mild stenosis. Left common and internal carotid arteries otherwise widely patent without stenosis, dissection or occlusion. Vertebral arteries: Both vertebral arteries arise from the subclavian arteries. Right vertebral artery dominant and widely patent within the neck. Left vertebral artery diffusely hypoplastic and largely occluded at its origin. Attenuated and irregular distal reconstitution at the left V2 segment, with irregular attenuated flow distally within the neck. Left vertebral is grossly patent as it courses into the skull base. Skeleton: No acute osseous abnormality. No discrete or worrisome osseous lesions. Mild cervical spondylosis at C3-4 through C5-6. Other neck: No other acute soft tissue abnormality within the neck. No mass lesion or adenopathy. Upper chest: Multifocal patchy parenchymal opacity seen within the peripheral upper lobes bilaterally, right greater than left, concerning for multifocal pneumonia. Visualized upper chest demonstrates no other acute finding. Review of  the MIP images confirms the above findings CTA HEAD FINDINGS Anterior circulation: Petrous segments widely patent. Multifocal atheromatous plaque throughout the cavernous/supraclinoid ICAs bilaterally. Associated up to moderate approximate 50% stenosis at the cavernous right ICA (series 13, image 429). On the left, there is resultant moderate to severe multifocal stenoses (series 13, image 428). A1 segments patent bilaterally. Normal anterior communicating artery. Anterior cerebral arteries patent to their distal aspects without stenosis. No M1 stenosis or occlusion. Normal MCA bifurcations. Distal MCA branches well perfused and symmetric. Posterior circulation: Dominant right vertebral artery patent to the vertebrobasilar junction without stenosis. Patent right PICA. Left vertebral artery attenuated but patent at the skull base, but subsequently occludes, and remains occluded to the vertebrobasilar junction. Left PICA not seen. Moderate diffuse stenosis noted at the mid basilar artery (series 13, image 430). Superior cerebral arteries patent bilaterally. Both PCAs primarily supplied via the basilar and are well perfused to their distal aspects. Small right posterior communicating artery noted. Venous sinuses: Patent. 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 No appreciable intracranial aneurysm. Review of the MIP images confirms the above findings IMPRESSION: 1. Negative CTA for emergent large vessel occlusion. 2. 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 Dominant right vertebral artery remains widely patent. 3. Additional moderate diffuse atheromatous narrowing of the mid basilar artery. 4. Multifocal atheromatous change throughout the carotid siphons, with associated moderate  to severe multifocal narrowing on the left and more  moderate narrowing on the right. Both carotid artery systems remain widely patent within the neck. 5. Multifocal parenchymal opacities within the visualized lungs, consistent with multifocal pneumonia. Electronically Signed   By: Rise Mu M.D.   On: 09/11/2019 00:15    EKG: Independently reviewed.  Normal sinus no acute ST-T changes  Assessment and plan  CVA Severe headaches for the last 2 weeks Last week headache nausea and vomiting which resolved The headaches associated with visual aura and previous episode of bilateral visual loss. 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 Suspicion of embolic infarcts Plan Neurology was consulted Recommendation echocardiogram with bubble study Aspirin Plavix, Lipitor  Hypothyroidism Resume Synthroid  Diabetes mellitus type 2 Resume home medication insulin sliding scale per sensitivity factor  Transaminitis Liver enzymes and bilirubin elevated We will do hepatitis profile, HIV /RUQ ultrasound  Suspicion of lung bilateral infiltrate on the CT angio of the neck Chest x-ray pending/will order a CT chest no contrast to rule out pneumonia Await for the results  Covid respiratory panel negative   Assessment/Plan Principal Problem:   CVA (cerebral vascular accident) (HCC) Active Problems:   Intractable headache   Hypothyroidism   Type 2 diabetes mellitus without complication (HCC)      DVT prophylaxis: Lovenox Code Status: Full code Family Communication:  Disposition Plan: Home Consults called: Neurology by ED Admission status: Observation   Briannie Gutierrez G Ayshia Gramlich MD [facility]  If 7PM-7AM, please contact night-coverage [email or web address]   09/11/2019, 6:27 AM

## 2019-09-11 NOTE — ED Provider Notes (Signed)
Assumed care from [place] at shift change.  See prior notes for full H&P.  Briefly, 54 y.o. F here with dizziness, headaches, and syncope.  Did have some nausea/vomiting and brief loss of vision in both eyes.  Feeling better after treatment with migraine cocktail here.   Plan: Neurology has recommended CTA of the head and neck along with MRI of the brain.  If negative, can discharge home to follow-up with outpatient neurology.  Results for orders placed or performed during the hospital encounter of 09/10/19  Ethanol  Result Value Ref Range   Alcohol, Ethyl (B) <10 <10 mg/dL  Protime-INR  Result Value Ref Range   Prothrombin Time 13.3 11.4 - 15.2 seconds   INR 1.0 0.8 - 1.2  APTT  Result Value Ref Range   aPTT 28 24 - 36 seconds  CBC  Result Value Ref Range   WBC 8.5 4.0 - 10.5 K/uL   RBC 4.39 3.87 - 5.11 MIL/uL   Hemoglobin 11.0 (L) 12.0 - 15.0 g/dL   HCT 34.3 (L) 36.0 - 46.0 %   MCV 78.1 (L) 80.0 - 100.0 fL   MCH 25.1 (L) 26.0 - 34.0 pg   MCHC 32.1 30.0 - 36.0 g/dL   RDW 14.2 11.5 - 15.5 %   Platelets 462 (H) 150 - 400 K/uL   nRBC 0.0 0.0 - 0.2 %  Differential  Result Value Ref Range   Neutrophils Relative % 68 %   Neutro Abs 5.8 1.7 - 7.7 K/uL   Lymphocytes Relative 20 %   Lymphs Abs 1.7 0.7 - 4.0 K/uL   Monocytes Relative 10 %   Monocytes Absolute 0.8 0.1 - 1.0 K/uL   Eosinophils Relative 1 %   Eosinophils Absolute 0.1 0.0 - 0.5 K/uL   Basophils Relative 0 %   Basophils Absolute 0.0 0.0 - 0.1 K/uL   Immature Granulocytes 1 %   Abs Immature Granulocytes 0.11 (H) 0.00 - 0.07 K/uL  Comprehensive metabolic panel  Result Value Ref Range   Sodium 137 135 - 145 mmol/L   Potassium 5.3 (H) 3.5 - 5.1 mmol/L   Chloride 100 98 - 111 mmol/L   CO2 23 22 - 32 mmol/L   Glucose, Bld 209 (H) 70 - 99 mg/dL   BUN 10 6 - 20 mg/dL   Creatinine, Ser 0.79 0.44 - 1.00 mg/dL   Calcium 9.0 8.9 - 10.3 mg/dL   Total Protein 8.0 6.5 - 8.1 g/dL   Albumin 3.4 (L) 3.5 - 5.0 g/dL   AST 139  (H) 15 - 41 U/L   ALT 105 (H) 0 - 44 U/L   Alkaline Phosphatase 292 (H) 38 - 126 U/L   Total Bilirubin 1.7 (H) 0.3 - 1.2 mg/dL   GFR calc non Af Amer >60 >60 mL/min   GFR calc Af Amer >60 >60 mL/min   Anion gap 14 5 - 15  I-Stat beta hCG blood, ED  Result Value Ref Range   I-stat hCG, quantitative <5.0 <5 mIU/mL   Comment 3          CBG monitoring, ED  Result Value Ref Range   Glucose-Capillary 200 (H) 70 - 99 mg/dL   CT Angio Head W or Wo Contrast  Result Date: 09/11/2019 CLINICAL DATA:  Initial evaluation for acute headache, syncope. EXAM: CT ANGIOGRAPHY HEAD AND NECK TECHNIQUE: Multidetector CT imaging of the head and neck was performed using the standard protocol during bolus administration of intravenous contrast. Multiplanar CT image reconstructions and MIPs  were obtained to evaluate the vascular anatomy. Carotid stenosis measurements (when applicable) are obtained utilizing NASCET criteria, using the distal internal carotid diameter as the denominator. CONTRAST:  72mL OMNIPAQUE IOHEXOL 350 MG/ML SOLN COMPARISON:  Prior noncontrast head CT from earlier the same day. FINDINGS: CTA NECK FINDINGS Aortic arch: Visualized aortic arch of normal caliber with normal branch pattern. No hemodynamically significant stenosis seen about the origin of the great vessels. Visualized subclavian arteries widely patent. Right carotid system: Right common and internal carotid arteries widely patent without stenosis, dissection, or occlusion. Left carotid system: Mild eccentric soft plaque within the mid left CCA with no more than mild stenosis. Left common and internal carotid arteries otherwise widely patent without stenosis, dissection or occlusion. Vertebral arteries: Both vertebral arteries arise from the subclavian arteries. Right vertebral artery dominant and widely patent within the neck. Left vertebral artery diffusely hypoplastic and largely occluded at its origin. Attenuated and irregular distal  reconstitution at the left V2 segment, with irregular attenuated flow distally within the neck. Left vertebral is grossly patent as it courses into the skull base. Skeleton: No acute osseous abnormality. No discrete or worrisome osseous lesions. Mild cervical spondylosis at C3-4 through C5-6. Other neck: No other acute soft tissue abnormality within the neck. No mass lesion or adenopathy. Upper chest: Multifocal patchy parenchymal opacity seen within the peripheral upper lobes bilaterally, right greater than left, concerning for multifocal pneumonia. Visualized upper chest demonstrates no other acute finding. Review of the MIP images confirms the above findings CTA HEAD FINDINGS Anterior circulation: Petrous segments widely patent. Multifocal atheromatous plaque throughout the cavernous/supraclinoid ICAs bilaterally. Associated up to moderate approximate 50% stenosis at the cavernous right ICA (series 13, image 429). On the left, there is resultant moderate to severe multifocal stenoses (series 13, image 428). A1 segments patent bilaterally. Normal anterior communicating artery. Anterior cerebral arteries patent to their distal aspects without stenosis. No M1 stenosis or occlusion. Normal MCA bifurcations. Distal MCA branches well perfused and symmetric. Posterior circulation: Dominant right vertebral artery patent to the vertebrobasilar junction without stenosis. Patent right PICA. Left vertebral artery attenuated but patent at the skull base, but subsequently occludes, and remains occluded to the vertebrobasilar junction. Left PICA not seen. Moderate diffuse stenosis noted at the mid basilar artery (series 13, image 430). Superior cerebral arteries patent bilaterally. Both PCAs primarily supplied via the basilar and are well perfused to their distal aspects. Small right posterior communicating artery noted. Venous sinuses: Patent. Anatomic variants: Hypoplastic left vertebral artery, occluded at the  vertebrobasilar junction. No appreciable intracranial aneurysm. Review of the MIP images confirms the above findings IMPRESSION: 1. Negative CTA for emergent large vessel occlusion. 2. 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 Dominant right vertebral artery remains widely patent. 3. Additional moderate diffuse atheromatous narrowing of the mid basilar artery. 4. Multifocal atheromatous change throughout the carotid siphons, with associated moderate to severe multifocal narrowing on the left and more moderate narrowing on the right. Both carotid artery systems remain widely patent within the neck. 5. Multifocal parenchymal opacities within the visualized lungs, consistent with multifocal pneumonia. Electronically Signed   By: Rise Mu M.D.   On: 09/11/2019 00:15   CT Head Wo Contrast  Result Date: 09/10/2019 CLINICAL DATA:  Found unresponsive. EXAM: CT HEAD WITHOUT CONTRAST TECHNIQUE: Contiguous axial images were obtained from the base of the skull through the vertex without intravenous contrast. COMPARISON:  May 31, 2012 FINDINGS: Brain: No evidence of acute infarction, hemorrhage, hydrocephalus, extra-axial collection or mass lesion/mass effect. Vascular: No hyperdense vessel or unexpected calcification. Skull: Normal. Negative for fracture or focal lesion. Sinuses/Orbits: No acute finding. Other: None. IMPRESSION: No acute intracranial pathology. Electronically Signed   By: Aram Candela M.D.   On: 09/10/2019 22:28   CT Angio Neck W and/or Wo Contrast  Result Date: 09/11/2019 CLINICAL DATA:  Initial evaluation for acute headache, syncope. EXAM: CT ANGIOGRAPHY HEAD AND NECK TECHNIQUE: Multidetector CT imaging of the head and neck was performed using the standard protocol during bolus administration of intravenous contrast. Multiplanar CT image  reconstructions and MIPs were obtained to evaluate the vascular anatomy. Carotid stenosis measurements (when applicable) are obtained utilizing NASCET criteria, using the distal internal carotid diameter as the denominator. CONTRAST:  12mL OMNIPAQUE IOHEXOL 350 MG/ML SOLN COMPARISON:  Prior noncontrast head CT from earlier the same day. FINDINGS: CTA NECK FINDINGS Aortic arch: Visualized aortic arch of normal caliber with normal branch pattern. No hemodynamically significant stenosis seen about the origin of the great vessels. Visualized subclavian arteries widely patent. Right carotid system: Right common and internal carotid arteries widely patent without stenosis, dissection, or occlusion. Left carotid system: Mild eccentric soft plaque within the mid left CCA with no more than mild stenosis. Left common and internal carotid arteries otherwise widely patent without stenosis, dissection or occlusion. Vertebral arteries: Both vertebral arteries arise from the subclavian arteries. Right vertebral artery dominant and widely patent within the neck. Left vertebral artery diffusely hypoplastic and largely occluded at its origin. Attenuated and irregular distal reconstitution at the left V2 segment, with irregular attenuated flow distally within the neck. Left vertebral is grossly patent as it courses into the skull base. Skeleton: No acute osseous abnormality. No discrete or worrisome osseous lesions. Mild cervical spondylosis at C3-4 through C5-6. Other neck: No other acute soft tissue abnormality within the neck. No mass lesion or adenopathy. Upper chest: Multifocal patchy parenchymal opacity seen within the peripheral upper lobes bilaterally, right greater than left, concerning for multifocal pneumonia. Visualized upper chest demonstrates no other acute finding. Review of the MIP images confirms the above findings CTA HEAD FINDINGS Anterior circulation: Petrous segments widely patent. Multifocal atheromatous plaque  throughout the cavernous/supraclinoid ICAs bilaterally. Associated up to moderate approximate 50% stenosis at the cavernous right ICA (series 13, image 429). On the left, there is resultant moderate to severe multifocal stenoses (series 13, image 428). A1 segments patent bilaterally. Normal anterior communicating artery. Anterior cerebral arteries patent to their distal aspects without stenosis. No M1 stenosis or occlusion. Normal MCA bifurcations. Distal MCA branches well perfused and symmetric. Posterior circulation: Dominant right vertebral artery patent to the vertebrobasilar junction without stenosis. Patent right PICA. Left vertebral artery attenuated but patent at the skull base, but subsequently occludes, and remains occluded to the vertebrobasilar junction. Left PICA not seen. Moderate diffuse stenosis noted at the mid basilar artery (series 13, image 430). Superior cerebral arteries patent bilaterally. Both PCAs primarily supplied via the basilar and are well perfused to their distal aspects. Small right posterior communicating artery noted. Venous sinuses: Patent. 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 No appreciable intracranial aneurysm. Review of the MIP images confirms the above findings IMPRESSION: 1. Negative CTA for emergent large vessel occlusion. 2. 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 Dominant right vertebral artery remains widely  patent. 3. Additional moderate diffuse atheromatous narrowing of the mid basilar artery. 4. Multifocal atheromatous change throughout the carotid siphons, with associated moderate to severe multifocal narrowing on the left and more moderate narrowing on the right. Both carotid artery systems remain widely patent within the neck. 5. Multifocal parenchymal opacities within the  visualized lungs, consistent with multifocal pneumonia. Electronically Signed   By: Rise Mu M.D.   On: 09/11/2019 00:15   CLINICAL DATA: Initial evaluation for acute headache, bilateral visual loss, syncope.  EXAM: MRI HEAD WITHOUT CONTRAST  TECHNIQUE: Multiplanar, multiecho pulse sequences of the brain and surrounding structures were obtained without intravenous contrast.  COMPARISON: Prior CT and CTA from 09/10/2019.  FINDINGS: Brain: Generalized age-related cerebral atrophy. Patchy T2/FLAIR hyperintensities seen within the periventricular, deep, and subcortical white matter both cerebral hemispheres, nonspecific, but most like related chronic microvascular ischemic disease, mild in nature. Superimposed remote lacunar infarct present within the left thalamus.  Subtle patchy diffusion abnormality seen involving the cortical and subcortical aspect of the bilateral parieto-occipital regions (series 5, image 72, 66 on axial DWI sequence, series 7, image 37 on coronal DWI sequence, suspicious for tiny acute ischemic infarcts. ADC correlate difficult to visualize given small size. No associated hemorrhage. No other evidence for acute or subacute ischemia. Gray-white matter differentiation otherwise maintained. No foci of susceptibility artifact to suggest acute or chronic intracranial hemorrhage.  No mass lesion, midline shift or mass effect. No hydrocephalus. No extra-axial fluid collection. Pituitary gland suprasellar region normal. Midline structures intact.  Vascular: Loss of normal flow void within the hypoplastic left vertebral artery, consistent with previously identified occlusion. Major intracranial vascular flow voids otherwise maintained.  Skull and upper cervical spine: Craniocervical junction within normal limits. Bone marrow signal intensity normal. No scalp soft tissue abnormality.  Sinuses/Orbits: Globes and orbital soft tissues within  normal limits. Mild scattered mucosal thickening noted within the ethmoidal air cells. Paranasal sinuses are otherwise clear. No mastoid effusion. Inner ear structures grossly normal.  Other: None.  IMPRESSION: 1. Few tiny punctate foci of diffusion abnormality involving the parieto-occipital regions bilaterally as above, suspicious for tiny acute ischemic infarcts given patient's symptoms. No associated hemorrhage or mass effect. 2. Underlying age-related cerebral atrophy with chronic microvascular ischemic disease, with additional chronic left thalamic lacunar infarct.   Electronically Signed By: Rise Mu M.D. On: 09/11/2019 02:17  CTA without evidence of large vessel occlusion, does have some narrowing of the mid basilar artery, carotids widely patent.  Incidentally, there is evidence of multifocal opacities within visualized lungs.  Patient reports an occasional cough, however she did pick her mother up for an appointment a few days ago and mother was found to be Covid +2 days after that.  Patient has not had any other exposures or contact with anyone with Covid.  Will obtain CXR and covid swab.  MRI of the brain does reveal bilateral areas of punctate foci of diffusion abnormality in the parieto-occipital region suspicious for tiny infarcts.  This has been discussed with neurology, Dr. Laurence Slate-- will admit for stroke work-up.  Main concern is for PFO so definitely wants echo.  Discussed with Dr. Nelda Bucks-- will admit for ongoing care.  COVID screen along with portable CXR pending.    Garlon Hatchet, PA-C 09/11/19 [id number]    Nicanor Alcon, April, MD 09/11/19 [phone]

## 2019-09-11 NOTE — Evaluation (Addendum)
Clinical/Bedside Swallow Evaluation Patient Details  Name: Amy Raymond MRN: [id number] Date of Birth: 1965-11-23  Today's Date: 09/11/2019 Time: SLP Start Time (ACUTE ONLY): 1478 SLP Stop Time (ACUTE ONLY): 1245 SLP Time Calculation (min) (ACUTE ONLY): 10 min  Past Medical History:  Past Medical History:  Diagnosis Date  . Diabetes mellitus without complication ([place])   . Thyroid disease    Past Surgical History: History reviewed. No pertinent surgical history. HPI:  Amy Raymond is a 54 y.o. female with medical history significant of hypertension, diabetes mellitus type 2 came with a chief complaint of severe headaches for the last 2 weeks and started and onset of vision loss in both eyes.  Chest CT and CXR consistent with hx of coronavirus.  MRI 1/31: "Few tiny punctate foci of diffusion abnormality involving the parieto-occipital regions bilaterally as above, suspicious for tiny acute ischemic infarcts given patient's symptoms. No associatedhemorrhage or mass effect."   Assessment / Plan / Recommendation Clinical Impression  Pt presents with functional swallowing as assessed clinically.  Pt tolerated all consistencies trialed with no clinical s/s of aspiration and exhibited good oral clearance of solids.  Pt's CXR is most consistent with hx of coronavirus.  Pt reports hx of GERD for which she used to take medications.  She reports she is managing reflux through diet changes and has lost 20 lbs in the past year with diet and exercises.  Pt has stopped taking perscription reflux medications.  Pt has no further ST needs.  SLP will sign off at this time.  Pt reports her speech has returned to baseline.  Denies cognitive changes.  Pt did not exhibit any word finding deficits in conversation and her speech was clear and free from dysarthria. SLP Visit Diagnosis: Dysphagia, unspecified (R13.10)    Aspiration Risk  No limitations    Diet Recommendation Regular;Thin liquid   Liquid  Administration via: Cup;Straw Medication Administration: Whole meds with liquid Supervision: Patient able to self feed    Other  Recommendations Oral Care Recommendations: Oral care BID   Follow up Recommendations None      Frequency and Duration (N/A)          Prognosis Prognosis for Safe Diet Advancement: (N/A)      Swallow Study   General Date of Onset: 09/10/19 HPI: Amy Raymond is a 54 y.o. female with medical history significant of hypertension, diabetes mellitus type 2 came with a chief complaint of severe headaches for the last 2 weeks and started and onset of vision loss in both eyes.  Chest CT and CXR consistent with hx of coronavirus.  MRI 1/31: "Few tiny punctate foci of diffusion abnormality involving the parieto-occipital regions bilaterally as above, suspicious for tiny acute ischemic infarcts given patient's symptoms. No associatedhemorrhage or mass effect." Type of Study: Bedside Swallow Evaluation Previous Swallow Assessment: none Diet Prior to this Study: Regular Temperature Spikes Noted: No Respiratory Status: Room air History of Recent Intubation: No Behavior/Cognition: Alert;Cooperative;Pleasant mood Oral Cavity Assessment: Within Functional Limits Oral Care Completed by SLP: No Oral Cavity - Dentition: Adequate natural dentition;Missing dentition Vision: Functional for self-feeding Self-Feeding Abilities: Able to feed self Patient Positioning: Upright in bed Baseline Vocal Quality: Normal Volitional Swallow: Able to elicit    Oral/Motor/Sensory Function Overall Oral Motor/Sensory Function: Within functional limits Facial ROM: Within Functional Limits Facial Symmetry: Within Functional Limits Lingual ROM: Within Functional Limits Lingual Symmetry: Abnormal symmetry right(? slight R protrusion) Lingual Strength: Within Functional Limits Velum: Within Functional Limits Mandible:  Within Functional Limits   Ice Chips Ice chips: Not tested   Thin  Liquid Thin Liquid: Within functional limits Presentation: Straw    Nectar Thick Nectar Thick Liquid: Not tested   Honey Thick Honey Thick Liquid: Not tested   Puree Puree: Not tested   Solid     Solid: Within functional limits Presentation: Self Fed      Kerrie Pleasure, MA, CCC-SLP Acute Rehabilitation Services Office: [phone] 09/11/2019,1:20 PM

## 2019-09-11 NOTE — Plan of Care (Signed)
Called by the nurse with concerns for patient having blank staring and decreased responsiveness.   When I saw her she was awake alert answers my questions appropriately though very slow in speech she knew where she is. She could see well she identified colors She says she lives alone but has a lot of family members here. She denies shortness of breath cough.  Denies fever at home. Vital signs 149/71 with a pulse of 89 and respiration 16 to 2200% on room air she is afebrile. EEG and echo ordered She is on aspirin Plavix and atorvastatin.  Hemoglobin A1c is 8.7 blood glucose 202 to 158 Will consult diabetic coordinator to educate for new onset diabetes Lipid profile pending  CT of the chest showed widely dispersed patchy pulmonary infiltrates typical for coronavirus infection no lobar consolidation or collapse multiple nonobstructing renal calculi and probable gallstones and aortic atherosclerosis and coronary artery calcification.  CT angiogram of the head and neck -Negative CTA for emergent large vessel occlusion. 2. 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 Dominant right vertebral artery remains widely patent. 3. Additional moderate diffuse atheromatous narrowing of the mid basilar artery. 4. Multifocal atheromatous change throughout the carotid siphons, with associated moderate to severe multifocal narrowing on the left and more moderate narrowing on the right. Both carotid artery systems remain widely patent within the neck. 5. Multifocal parenchymal opacities within the visualized lungs, consistent with multifocal pneumonia.  MRI of the brain without contrast-Few tiny punctate foci of diffusion abnormality involving the parieto-occipital regions bilaterally as above, suspicious for tiny acute ischemic infarcts given patient's symptoms. No  associated hemorrhage or mass effect.  Underlying age-related cerebral atrophy with chronic microvascular ischemic disease, with additional chronic left thalamic lacunar infarct.  She is being worked up for new stroke-EEG echo PT OT speech therapy pending

## 2019-09-12 ENCOUNTER — Inpatient Hospital Stay (HOSPITAL_COMMUNITY): Payer: BLUE CROSS/BLUE SHIELD

## 2019-09-12 DIAGNOSIS — I6300 Cerebral infarction due to thrombosis of unspecified precerebral artery: Secondary | ICD-10-CM

## 2019-09-12 DIAGNOSIS — I639 Cerebral infarction, unspecified: Secondary | ICD-10-CM

## 2019-09-12 LAB — LIPID PANEL
Cholesterol: 172 mg/dL (ref 0–200)
HDL: 21 mg/dL — ABNORMAL LOW (ref >40)
LDL Cholesterol: 117 mg/dL — ABNORMAL HIGH (ref 0–99)
Total CHOL/HDL Ratio: 8.2 RATIO
Triglycerides: 168 mg/dL — ABNORMAL HIGH (ref <150)
VLDL: 34 mg/dL (ref 0–40)

## 2019-09-12 LAB — GLUCOSE, CAPILLARY
Glucose-Capillary: 193 mg/dL — ABNORMAL HIGH (ref 70–99)
Glucose-Capillary: 151 mg/dL — ABNORMAL HIGH (ref 70–99)
Glucose-Capillary: 148 mg/dL — ABNORMAL HIGH (ref 70–99)
Glucose-Capillary: 138 mg/dL — ABNORMAL HIGH (ref 70–99)

## 2019-09-12 MED ORDER — LEVOTHYROXINE SODIUM 175 MCG PO TABS: 175.0000 ug | ORAL_TABLET | Freq: Every day | ORAL | 3 refills | Status: DC

## 2019-09-12 MED ORDER — ATORVASTATIN CALCIUM 40 MG PO TABS: 40.0000 mg | ORAL_TABLET | Freq: Every day | ORAL | 3 refills | Status: DC

## 2019-09-12 MED ORDER — CLOPIDOGREL BISULFATE 75 MG PO TABS: 75.0000 mg | ORAL_TABLET | Freq: Every day | ORAL | 2 refills | Status: DC

## 2019-09-12 MED ORDER — IOHEXOL 300 MG/ML  SOLN
100.0000 mL | Freq: Once | INTRAMUSCULAR | Status: AC | PRN
  Administered 2019-09-12: 20:00:00 100 mL via INTRAVENOUS

## 2019-09-12 MED ORDER — ASPIRIN 81 MG PO TBEC: 81.0000 mg | DELAYED_RELEASE_TABLET | Freq: Every day | ORAL | Status: DC

## 2019-09-12 MED ORDER — ONDANSETRON HCL 4 MG PO TABS: 4.0000 mg | ORAL_TABLET | Freq: Four times a day (QID) | ORAL | 0 refills | Status: AC | PRN

## 2019-09-12 NOTE — Discharge Summary (Signed)
Physician Discharge Summary  Amy Raymond JYN:[id number] DOB: 05-04-1966 DOA: 09/10/2019  PCP: Amy Raymond  Admit date: 09/10/2019 Discharge date: 09/12/2019  Admitted From: Home Disposition: Home Recommendations for Outpatient Follow-up:  1. Follow up with PCP in 1-2 weeks 2. Please obtain BMP/CBC in one week 3. Please follow up with [facility]  Home Health: None Equipment/Devices none Discharge Condition stable and improved CODE STATUS: Full code  diet recommendation: Cardiac diet Brief/Interim Summary: 54 year old female with type 2 diabetes uncontrolled, hypothyroidism, migraines admitted with sudden onset of visual loss in both eyes followed by loss of consciousness.  Discharge Diagnoses:  Principal Problem:   CVA (cerebral vascular accident) Amy [facility](HCC) Active Problems:   Intractable headache   Hypothyroidism   Type 2 diabetes mellitus without complication (HCC)   Elevated liver enzymes  #1 stroke patient presented with inability to speak with sudden onset of vision loss in both eyes followed by loss of consciousness-MRI showed tiny punctuate foci of diffusion abnormality involving the parieto-occipital regions bilaterally suspecting embolic etiology.  CT head showed no acute findings. 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 EEG normal. Echo-ejection fraction 60 to 65% with normal left ventricular function.  No atrial level shunt detected by color-flow Doppler. Venous Doppler-no evidence of DVT LDL 117 Hemoglobin A1c is 8.7 Continue aspirin and Plavix for 3 months and then continue only aspirin. She was seen by PT and OT did not recommend any therapy on discharge.  CT of the abdomen and pelvis-fatty liver, cholelithiasis without evidence  of cholecystitis, mild to moderate severe bibasilar infiltrates.  #2 type 2 diabetes hemoglobin A1c is 8.7 she reports that one of her medications were not covered by her insurance so her primary care physician is working on it.  I will continue home medications.  #3 hypertension-blood pressure 130/80  #4 hyperlipidemia LDL is 117.  On Lipitor 40 mg daily continue on discharge.  Estimated body mass index is 30.11 kg/m as calculated from the following:   Height as of this encounter: 5\' 3"  (1.6 m).   Weight as of this encounter: 77.1 kg.  Discharge Instructions   Allergies as of 09/12/2019   No Known Allergies     Medication List    TAKE these medications   aspirin 81 MG EC tablet Take 1 tablet (81 mg total) by mouth daily. Start taking on: September 13, 2019   atorvastatin 40 MG tablet Commonly known as: LIPITOR Take 1 tablet (40 mg total) by mouth daily at 6 PM.   cholecalciferol 25 MCG (1000 UNIT) tablet Commonly known as: VITAMIN D3 Take 1,000 Units by mouth daily.   clopidogrel 75 MG tablet Commonly known as: PLAVIX Take 1 tablet (75 mg total) by mouth daily. Start taking on: September 13, 2019   ferrous sulfate 325 (65 FE) MG tablet Take 325 mg by mouth daily with breakfast.   glipiZIDE 10 MG tablet Commonly known as: GLUCOTROL Take 10 mg by mouth 2 (two) times daily.   Jentadueto 2.12-998 MG Tabs Generic drug: linaGLIPtin-metFORMIN HCl Take 1 tablet by mouth 2 (two) times daily.   levothyroxine 175 MCG tablet Commonly known as: SYNTHROID Take 1 tablet (175 mcg total) by mouth daily. Start taking on: September 13, 2019 What changed:   medication strength  how much to take  additional instructions  ondansetron 4 MG tablet Commonly known as: ZOFRAN Take 1 tablet (4 mg total) by mouth every 6 (six) hours as needed for nausea.      Follow-up Information    [place], Julie M, [facility] Follow up.   Specialty: Nurse Practitioner Contact information: [address] [place] [address] [phone]          No Known Allergies  Consultations: Neurology  Procedures/Studies: EEG  Result Date: 09/11/2019 Amy Quest, MD     09/11/2019 11:39 AM Patient Name: Amy Raymond MRN: [id number] Epilepsy Attending: Charlsie Raymond Referring Physician/Provider: Dr Milon Raymond Date: 09/11/2019 Duration: 23.34 mins Patient history: 54 y.o. female with past medical history significant for diabetes mellitus, thyroid disease and migraines with visual aura presents to the emergency room with sudden onset inability to get words out followed by dizziness and facial numbness.  She had a previous episode of bilateral vision loss. Then had an episode of staring spell. EEG to assess for seizure. Level of alertness: Awake AEDs during EEG study: None Technical aspects: This EEG study was done with scalp electrodes positioned according to the 10-20 International system of electrode placement. Electrical activity was acquired at a sampling rate of 500Hz  and reviewed with a high frequency filter of 70Hz  and a low frequency filter of 1Hz . EEG data were recorded continuously and digitally stored. DESCRIPTION: The posterior dominant rhythm consists of 9-10 Hz activity of moderate voltage (25-35 uV) seen predominantly in posterior head regions, symmetric and reactive to eye opening and eye closing. Physiologic photic driving was seen during photic stimulation. Hyperventilation was not performed due to acute CVA. IMPRESSION: This study is within normal limits. No seizures or epileptiform discharges were seen throughout the recording.   CT Angio Head W or Wo Contrast  Result Date: 09/11/2019 CLINICAL DATA:  Initial evaluation for acute headache, syncope. EXAM: CT ANGIOGRAPHY HEAD AND NECK TECHNIQUE: Multidetector CT imaging of the head and neck was performed using the standard protocol during bolus administration of intravenous contrast.  Multiplanar CT image reconstructions and MIPs were obtained to evaluate the vascular anatomy. Carotid stenosis measurements (when applicable) are obtained utilizing NASCET criteria, using the distal internal carotid diameter as the denominator. CONTRAST:  40mL OMNIPAQUE IOHEXOL 350 MG/ML SOLN COMPARISON:  Prior noncontrast head CT from earlier the same day. FINDINGS: CTA NECK FINDINGS Aortic arch: Visualized aortic arch of normal caliber with normal branch pattern. No hemodynamically significant stenosis seen about the origin of the great vessels. Visualized subclavian arteries widely patent. Right carotid system: Right common and internal carotid arteries widely patent without stenosis, dissection, or occlusion. Left carotid system: Mild eccentric soft plaque within the mid left CCA with no more than mild stenosis. Left common and internal carotid arteries otherwise widely patent without stenosis, dissection or occlusion. Vertebral arteries: Both vertebral arteries arise from the subclavian arteries. Right vertebral artery dominant and widely patent within the neck. Left vertebral artery diffusely hypoplastic and largely occluded at its origin. Attenuated and irregular distal reconstitution at the left V2 segment, with irregular attenuated flow distally within the neck. Left vertebral is grossly patent as it courses into the skull base. Skeleton: No acute osseous abnormality. No discrete or worrisome osseous lesions. Mild cervical spondylosis at C3-4 through C5-6. Other neck: No other acute soft tissue abnormality within the neck. No mass lesion or adenopathy. Upper chest: Multifocal patchy parenchymal opacity seen within the peripheral upper lobes bilaterally, right greater than left, concerning for multifocal  pneumonia. Visualized upper chest demonstrates no other acute finding. Review of the MIP images confirms the above findings CTA HEAD FINDINGS Anterior circulation: Petrous segments widely patent. Multifocal  atheromatous plaque throughout the cavernous/supraclinoid ICAs bilaterally. Associated up to moderate approximate 50% stenosis at the cavernous right ICA (series 13, image 429). On the left, there is resultant moderate to severe multifocal stenoses (series 13, image 428). A1 segments patent bilaterally. Normal anterior communicating artery. Anterior cerebral arteries patent to their distal aspects without stenosis. No M1 stenosis or occlusion. Normal MCA bifurcations. Distal MCA branches well perfused and symmetric. Posterior circulation: Dominant right vertebral artery patent to the vertebrobasilar junction without stenosis. Patent right PICA. Left vertebral artery attenuated but patent at the skull base, but subsequently occludes, and remains occluded to the vertebrobasilar junction. Left PICA not seen. Moderate diffuse stenosis noted at the mid basilar artery (series 13, image 430). Superior cerebral arteries patent bilaterally. Both PCAs primarily supplied via the basilar and are well perfused to their distal aspects. Small right posterior communicating artery noted. Venous sinuses: Patent. 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 No appreciable intracranial aneurysm. Review of the MIP images confirms the above findings IMPRESSION: 1. Negative CTA for emergent large vessel occlusion. 2. 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 Dominant right vertebral artery remains widely patent. 3. Additional moderate diffuse atheromatous narrowing of the mid basilar artery. 4. Multifocal atheromatous change throughout the carotid siphons, with associated moderate to severe multifocal narrowing on the left and more moderate narrowing on the right. Both carotid artery systems remain widely patent within the neck. 5. Multifocal parenchymal  opacities within the visualized lungs, consistent with multifocal pneumonia. Electronically Signed   By: Rise Mu M.D.   On: 09/11/2019 00:15   CT Head Wo Contrast  Result Date: 09/10/2019 CLINICAL DATA:  Found unresponsive. EXAM: CT HEAD WITHOUT CONTRAST TECHNIQUE: Contiguous axial images were obtained from the base of the skull through the vertex without intravenous contrast. COMPARISON:  May 31, 2012 FINDINGS: Brain: No evidence of acute infarction, hemorrhage, hydrocephalus, extra-axial collection or mass lesion/mass effect. Vascular: No hyperdense vessel or unexpected calcification. Skull: Normal. Negative for fracture or focal lesion. Sinuses/Orbits: No acute finding. Other: None. IMPRESSION: No acute intracranial pathology. Electronically Signed   By: Aram Candela M.D.   On: 09/10/2019 22:28   CT Angio Neck W and/or Wo Contrast  Result Date: 09/11/2019 CLINICAL DATA:  Initial evaluation for acute headache, syncope. EXAM: CT ANGIOGRAPHY HEAD AND NECK TECHNIQUE: Multidetector CT imaging of the head and neck was performed using the standard protocol during bolus administration of intravenous contrast. Multiplanar CT image reconstructions and MIPs were obtained to evaluate the vascular anatomy. Carotid stenosis measurements (when applicable) are obtained utilizing NASCET criteria, using the distal internal carotid diameter as the denominator. CONTRAST:  75mL OMNIPAQUE IOHEXOL 350 MG/ML SOLN COMPARISON:  Prior noncontrast head CT from earlier the same day. FINDINGS: CTA NECK FINDINGS Aortic arch: Visualized aortic arch of normal caliber with normal branch pattern. No hemodynamically significant stenosis seen about the origin of the great vessels. Visualized subclavian arteries widely patent. Right carotid system: Right common and internal carotid arteries widely patent without stenosis, dissection, or occlusion. Left carotid system: Mild eccentric soft plaque within the mid left CCA  with no more than mild stenosis. Left common and internal carotid arteries otherwise widely patent without stenosis, dissection or occlusion. Vertebral  arteries: Both vertebral arteries arise from the subclavian arteries. Right vertebral artery dominant and widely patent within the neck. Left vertebral artery diffusely hypoplastic and largely occluded at its origin. Attenuated and irregular distal reconstitution at the left V2 segment, with irregular attenuated flow distally within the neck. Left vertebral is grossly patent as it courses into the skull base. Skeleton: No acute osseous abnormality. No discrete or worrisome osseous lesions. Mild cervical spondylosis at C3-4 through C5-6. Other neck: No other acute soft tissue abnormality within the neck. No mass lesion or adenopathy. Upper chest: Multifocal patchy parenchymal opacity seen within the peripheral upper lobes bilaterally, right greater than left, concerning for multifocal pneumonia. Visualized upper chest demonstrates no other acute finding. Review of the MIP images confirms the above findings CTA HEAD FINDINGS Anterior circulation: Petrous segments widely patent. Multifocal atheromatous plaque throughout the cavernous/supraclinoid ICAs bilaterally. Associated up to moderate approximate 50% stenosis at the cavernous right ICA (series 13, image 429). On the left, there is resultant moderate to severe multifocal stenoses (series 13, image 428). A1 segments patent bilaterally. Normal anterior communicating artery. Anterior cerebral arteries patent to their distal aspects without stenosis. No M1 stenosis or occlusion. Normal MCA bifurcations. Distal MCA branches well perfused and symmetric. Posterior circulation: Dominant right vertebral artery patent to the vertebrobasilar junction without stenosis. Patent right PICA. Left vertebral artery attenuated but patent at the skull base, but subsequently occludes, and remains occluded to the vertebrobasilar  junction. Left PICA not seen. Moderate diffuse stenosis noted at the mid basilar artery (series 13, image 430). Superior cerebral arteries patent bilaterally. Both PCAs primarily supplied via the basilar and are well perfused to their distal aspects. Small right posterior communicating artery noted. Venous sinuses: Patent. 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 No appreciable intracranial aneurysm. Review of the MIP images confirms the above findings IMPRESSION: 1. Negative CTA for emergent large vessel occlusion. 2. 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 Dominant right vertebral artery remains widely patent. 3. Additional moderate diffuse atheromatous narrowing of the mid basilar artery. 4. Multifocal atheromatous change throughout the carotid siphons, with associated moderate to severe multifocal narrowing on the left and more moderate narrowing on the right. Both carotid artery systems remain widely patent within the neck. 5. Multifocal parenchymal opacities within the visualized lungs, consistent with multifocal pneumonia. Electronically Signed   By: Rise Mu M.D.   On: 09/11/2019 00:15   CT CHEST WO CONTRAST  Result Date: 09/11/2019 CLINICAL DATA:  Pneumonia EXAM: CT CHEST WITHOUT CONTRAST TECHNIQUE: Multidetector CT imaging of the chest was performed following the standard protocol without IV contrast. COMPARISON:  Radiography same day FINDINGS: Cardiovascular: Heart size is normal. There is some coronary artery calcification. There is some aortic atherosclerotic calcification. No sign of aneurysm. No pericardial fluid. Mediastinum/Nodes: No mass or lymphadenopathy seen on this noncontrast study. Lungs/Pleura: Widely dispersed patchy pulmonary infiltrates with a somewhat peripheral prominent distribution,  typical of infiltrates seen with coronavirus infection. No lobar consolidation or collapse. No pleural effusion. No cavitation. Upper Abdomen: Multiple bilateral nonobstructing renal calculi. Probable gallstones. Musculoskeletal: Negative IMPRESSION: Widely dispersed patchy pulmonary infiltrates typical of those seen in coronavirus infection. No lobar consolidation or collapse. Multiple nonobstructing renal calculi. Probable gallstones. Aortic atherosclerosis.  Coronary artery calcification. Electronically Signed   By: Paulina Fusi M.D.   On: 09/11/2019 08:44   MR BRAIN WO CONTRAST  Result  Date: 09/11/2019 CLINICAL DATA:  Initial evaluation for acute headache, bilateral visual loss, syncope. EXAM: MRI HEAD WITHOUT CONTRAST TECHNIQUE: Multiplanar, multiecho pulse sequences of the brain and surrounding structures were obtained without intravenous contrast. COMPARISON:  Prior CT and CTA from 09/10/2019. FINDINGS: Brain: Generalized age-related cerebral atrophy. Patchy T2/FLAIR hyperintensities seen within the periventricular, deep, and subcortical white matter both cerebral hemispheres, nonspecific, but most like related chronic microvascular ischemic disease, mild in nature. Superimposed remote lacunar infarct present within the left thalamus. Subtle patchy diffusion abnormality seen involving the cortical and subcortical aspect of the bilateral parieto-occipital regions (series 5, image 72, 66 on axial DWI sequence, series 7, image 37 on coronal DWI sequence, suspicious for tiny acute ischemic infarcts. ADC correlate difficult to visualize given small size. No associated hemorrhage. No other evidence for acute or subacute ischemia. Gray-white matter differentiation otherwise maintained. No foci of susceptibility artifact to suggest acute or chronic intracranial hemorrhage. No mass lesion, midline shift or mass effect. No hydrocephalus. No extra-axial fluid collection. Pituitary gland suprasellar region normal.  Midline structures intact. Vascular: Loss of normal flow void within the hypoplastic left vertebral artery, consistent with previously identified occlusion. Major intracranial vascular flow voids otherwise maintained. Skull and upper cervical spine: Craniocervical junction within normal limits. Bone marrow signal intensity normal. No scalp soft tissue abnormality. Sinuses/Orbits: Globes and orbital soft tissues within normal limits. Mild scattered mucosal thickening noted within the ethmoidal air cells. Paranasal sinuses are otherwise clear. No mastoid effusion. Inner ear structures grossly normal. Other: None. IMPRESSION: 1. Few tiny punctate foci of diffusion abnormality involving the parieto-occipital regions bilaterally as above, suspicious for tiny acute ischemic infarcts given patient's symptoms. No associated hemorrhage or mass effect. 2. Underlying age-related cerebral atrophy with chronic microvascular ischemic disease, with additional chronic left thalamic lacunar infarct. Electronically Signed   By: Jeannine Boga M.D.   On: 09/11/2019 02:17   DG Chest Port 1 View  Result Date: 09/11/2019 CLINICAL DATA:  Pulmonary opacities on recent neck CT. COVID exposure. EXAM: PORTABLE CHEST 1 VIEW COMPARISON:  CT neck 09/10/2019; chest radiograph 05/31/2012 FINDINGS: Monitoring leads overlie the patient. Stable cardiac and mediastinal contours. Patchy bilateral airspace opacities are demonstrated. No pleural effusion or pneumothorax. IMPRESSION: Patchy bilateral airspace opacities most compatible with history of COVID-19. Electronically Signed   By: Lovey Newcomer M.D.   On: 09/11/2019 04:12   ECHOCARDIOGRAM COMPLETE BUBBLE STUDY  Result Date: 09/11/2019   ECHOCARDIOGRAM REPORT   Patient Name:   ALBIRTHA GRINAGE Date of Exam: 09/11/2019 Medical Rec #:  [id number]       Height:       62.0 in Accession #:    [id number]      Weight:       178.1 lb Date of Birth:  November 11, 1965       BSA:          1.82 m Patient  Age:    54 years        BP:           131/74 mmHg Patient Gender: F               HR:           94 bpm. Exam Location:  Inpatient Portions of this table do not appear on this page. Procedure: 2D Echo Indications:    PFO  History:        Patient has no prior history of Echocardiogram examinations.  Risk Factors:Diabetes.  Sonographer:    Celene Skeen RDCS (AE) Referring Phys: [id number] MIRCEA G CRISTESCU IMPRESSIONS  1. Left ventricular ejection fraction, by visual estimation, is 60 to 65%. The left ventricle has normal function. There is mildly increased left ventricular hypertrophy.  2. The left ventricle has no regional wall motion abnormalities.  3. Global right ventricle has normal systolic function.The right ventricular size is normal. No increase in right ventricular wall thickness.  4. Left atrial size was normal.  5. Right atrial size was normal.  6. The mitral valve is abnormal. Trivial mitral valve regurgitation.  7. The tricuspid valve is normal in structure.  8. The tricuspid valve is normal in structure. Tricuspid valve regurgitation is trivial.  9. The aortic valve is tricuspid. Aortic valve regurgitation is not visualized. Mild aortic valve sclerosis without stenosis. 10. Pulmonic regurgitation is mild. 11. The pulmonic valve was normal in structure. Pulmonic valve regurgitation is mild. 12. The inferior vena cava is normal in size with greater than 50% respiratory variability, suggesting right atrial pressure of 3 mmHg. FINDINGS  Left Ventricle: Left ventricular ejection fraction, by visual estimation, is 60 to 65%. The left ventricle has normal function. The left ventricle has no regional wall motion abnormalities. The left ventricular internal cavity size was the left ventricle is normal in size. There is mildly increased left ventricular hypertrophy. Right Ventricle: The right ventricular size is normal. No increase in right ventricular wall thickness. Global RV systolic function is  has normal systolic function. Left Atrium: Left atrial size was normal in size. Right Atrium: Right atrial size was normal in size Pericardium: There is no evidence of pericardial effusion. Mitral Valve: The mitral valve is abnormal. There is mild thickening of the mitral valve leaflet(s). Trivial mitral valve regurgitation. Tricuspid Valve: The tricuspid valve is normal in structure. Tricuspid valve regurgitation is trivial. Aortic Valve: The aortic valve is tricuspid. Aortic valve regurgitation is not visualized. Mild aortic valve sclerosis is present, with no evidence of aortic valve stenosis. Pulmonic Valve: The pulmonic valve was normal in structure. Pulmonic valve regurgitation is mild. Pulmonic regurgitation is mild. Aorta: The aortic root is normal in size and structure. Venous: The inferior vena cava is normal in size with greater than 50% respiratory variability, suggesting right atrial pressure of 3 mmHg. IAS/Shunts: No atrial level shunt detected by color flow Doppler.  LEFT VENTRICLE PLAX 2D LVIDd:         2.50 cm  Diastology LVIDs:         1.80 cm  LV e' lateral:   10.70 cm/s LV PW:         1.20 cm  LV E/e' lateral: 5.9 LV IVS:        1.30 cm LVOT diam:     2.00 cm LV SV:         13 ml LV SV Index:   6.59 LVOT Area:     3.14 cm  RIGHT VENTRICLE RV S prime:     12.70 cm/s TAPSE (M-mode): 1.9 cm LEFT ATRIUM             Index       RIGHT ATRIUM          Index LA diam:        3.00 cm 1.65 cm/m  RA Area:     7.57 cm LA Vol (A2C):   22.5 ml 12.36 ml/m RA Volume:   11.50 ml 6.32 ml/m LA Vol (A4C):   18.2 ml 10.00  ml/m LA Biplane Vol: 21.1 ml 11.59 ml/m  AORTIC VALVE LVOT Vmax:   69.20 cm/s LVOT Vmean:  55.400 cm/s LVOT VTI:    0.120 m  AORTA Ao Root diam: 3.30 cm MITRAL VALVE MV Area (PHT): 1.75 cm             SHUNTS MV PHT:        125.57 msec          Systemic VTI:  0.12 m MV Decel Time: 433 msec             Systemic Diam: 2.00 cm MV E velocity: 63.00 cm/s 103 cm/s MV A velocity: 96.40 cm/s 70.3 cm/s  MV E/A ratio:  0.65       1.5  Dietrich Pates MD Electronically signed by Dietrich Pates MD Signature Date/Time: 09/11/2019/4:23:28 PM    Final    VAS [place] LOWER EXTREMITY VENOUS (DVT)  Result Date: 09/12/2019  Lower Venous Study Indications: Stroke.  Risk Factors: DM. Limitations: Poor ultrasound/tissue interface. Comparison Study: No prior exam. Performing Technologist: Kennedy Bucker ARDMS, RVT  Examination Guidelines: A complete evaluation includes B-mode imaging, spectral Doppler, color Doppler, and power Doppler as needed of all accessible portions of each vessel. Bilateral testing is considered an integral part of a complete examination. Limited examinations for reoccurring indications may be performed as noted.  +---------+---------------+---------+-----------+----------+--------------+ RIGHT    CompressibilityPhasicitySpontaneityPropertiesThrombus Aging +---------+---------------+---------+-----------+----------+--------------+ CFV      Full           Yes      Yes                                 +---------+---------------+---------+-----------+----------+--------------+ SFJ      Full                                                        +---------+---------------+---------+-----------+----------+--------------+ FV Prox  Full                                                        +---------+---------------+---------+-----------+----------+--------------+ FV Mid   Full                                                        +---------+---------------+---------+-----------+----------+--------------+ FV DistalFull                                                        +---------+---------------+---------+-----------+----------+--------------+ PFV      Full                                                        +---------+---------------+---------+-----------+----------+--------------+  POP      Full           Yes      Yes                                  +---------+---------------+---------+-----------+----------+--------------+ PTV      Full                                                        +---------+---------------+---------+-----------+----------+--------------+ PERO     Full                                                        +---------+---------------+---------+-----------+----------+--------------+ Hypoechoic, oval structure with echogenic Raymond seen in right groin measuring 3.8 x 1.7 x 0.8 cm.  +---------+---------------+---------+-----------+----------+--------------+ LEFT     CompressibilityPhasicitySpontaneityPropertiesThrombus Aging +---------+---------------+---------+-----------+----------+--------------+ CFV      Full           Yes      Yes                                 +---------+---------------+---------+-----------+----------+--------------+ SFJ      Full                                                        +---------+---------------+---------+-----------+----------+--------------+ FV Prox  Full                                                        +---------+---------------+---------+-----------+----------+--------------+ FV Mid   Full                                                        +---------+---------------+---------+-----------+----------+--------------+ FV DistalFull                                                        +---------+---------------+---------+-----------+----------+--------------+ PFV      Full                                                        +---------+---------------+---------+-----------+----------+--------------+ POP      Full           Yes      Yes                                 +---------+---------------+---------+-----------+----------+--------------+  PTV      Full                                                        +---------+---------------+---------+-----------+----------+--------------+ PERO     Full                                                         +---------+---------------+---------+-----------+----------+--------------+ Poorly visualized bilteral calf veins due to tissue properties.    Summary: Right: There is no evidence of deep vein thrombosis in the lower extremity. No cystic structure found in the popliteal fossa. Hypoechoic, oval structure with echogenic Raymond seen in right groin measuring 3.8 x 1.7 x 0.8 cm. Left: There is no evidence of deep vein thrombosis in the lower extremity. No cystic structure found in the popliteal fossa.  *See table(s) above for measurements and observations.    Preliminary    US Abdomen Limited RUQ  Result Date: 09/11/2019 CLINICAL DATA:  Transaminitis. EXAM: ULTRASOUND ABDOMEN LIMITED RIGHT UPPER QUADRANT COMPARISON:  None. FINDINGS: Gallbladder: Single mobile 1.6 cm gallstone is present. No evidence of gallbladder wall thickening or pericholecystic fluid. Negative sonographic Murphy sign. Common bile duct: Diameter: 3.4 mm. Liver: Mild increased echogenicity without focal mass. Portal vein is patent on color Doppler imaging with normal direction of blood flow towards the liver. Other: None. IMPRESSION: 1. Single 1.6 cm mobile gallstone. No additional findings to suggest acute cholecystitis. 2.  Suggestion of mild hepatic steatosis.  No focal mass. Electronically Signed   By: Elberta Fortis M.D.   On: 09/11/2019 09:16   (Echo, Carotid, EGD, Colonoscopy, ERCP)    Subjective:  Patient resting in bed in no acute distress  Discharge Exam: Vitals:   09/12/19 0801 09/12/19 1211  BP: 116/74 130/80  Pulse: 85 86  Resp: 20 16  Temp: 98.8 F (37.1 C) 98.4 F (36.9 C)  SpO2: 98% 99%   Vitals:   09/11/19 1400 09/11/19 1500 09/12/19 0801 09/12/19 1211  BP:  114/61 116/74 130/80  Pulse:  98 85 86  Resp:  18 20 16   Temp:  98.5 F (36.9 C) 98.8 F (37.1 C) 98.4 F (36.9 C)  TempSrc:  Oral Oral Oral  SpO2:  99% 98% 99%  Weight: 77.1 kg     Height: 5\' 3"   (1.6 m)       General: Pt is alert, awake, not in acute distress Cardiovascular: RRR, S1/S2 +, no rubs, no gallops Respiratory: CTA bilaterally, no wheezing, no rhonchi Abdominal: Soft, NT, ND, bowel sounds + Extremities: no edema, no cyanosis    The results of significant diagnostics from this hospitalization (including imaging, microbiology, ancillary and laboratory) are listed below for reference.     Microbiology: Recent Results (from the past 240 hour(s))  Respiratory Panel by RT PCR (Flu A&B, Covid) - Nasopharyngeal Swab     Status: None   Collection Time: 09/11/19  3:44 AM   Specimen: Nasopharyngeal Swab  Result Value Ref Range Status   SARS Coronavirus 2 by RT PCR NEGATIVE NEGATIVE Final    Comment: (NOTE) SARS-CoV-2 target nucleic acids are NOT DETECTED. The SARS-CoV-2 RNA is generally detectable in upper  respiratoy specimens during the acute phase of infection. The lowest concentration of SARS-CoV-2 viral copies this assay can detect is 131 copies/mL. A negative result does not preclude SARS-Cov-2 infection and should not be used as the sole basis for treatment or other patient management decisions. A negative result may occur with  improper specimen collection/handling, submission of specimen other than nasopharyngeal swab, presence of viral mutation(s) within the areas targeted by this assay, and inadequate number of viral copies (<131 copies/mL). A negative result must be combined with clinical observations, patient history, and epidemiological information. The expected result is Negative. Fact Sheet for Patients:  https://www.moore.com/https://www.fda.gov/media/142436/download Fact Sheet for Healthcare Providers:  https://www.young.biz/https://www.fda.gov/media/142435/download This test is not yet ap proved or cleared by the [place] FDA and  has been authorized for detection and/or diagnosis of SARS-CoV-2 by FDA under an Emergency Use Authorization (EUA). This EUA will remain  in effect (meaning  this test can be used) for the duration of the COVID-19 declaration under Section 564(b)(1) of the Act, 21 U.S.C. section 360bbb-3(b)(1), unless the authorization is terminated or revoked sooner.    Influenza A by PCR NEGATIVE NEGATIVE Final   Influenza B by PCR NEGATIVE NEGATIVE Final    Comment: (NOTE) The Xpert Xpress SARS-CoV-2/FLU/RSV assay is intended as an aid in  the diagnosis of influenza from Nasopharyngeal swab specimens and  should not be used as a sole basis for treatment. Nasal washings and  aspirates are unacceptable for Xpert Xpress SARS-CoV-2/FLU/RSV  testing. Fact Sheet for Patients: https://www.moore.com/https://www.fda.gov/media/142436/download Fact Sheet for Healthcare Providers: https://www.young.biz/https://www.fda.gov/media/142435/download This test is not yet approved or cleared by the [place] FDA and  has been authorized for detection and/or diagnosis of SARS-CoV-2 by  FDA under an Emergency Use Authorization (EUA). This EUA will remain  in effect (meaning this test can be used) for the duration of the  Covid-19 declaration under Section 564(b)(1) of the Act, 21  U.S.C. section 360bbb-3(b)(1), unless the authorization is  terminated or revoked. Performed at [facility], [address] [place], [place] [address]      Labs: BNP (last 3 results) No results for input(s): BNP in the last 8760 hours. Basic Metabolic Panel: Recent Labs  Lab 09/10/19 2150 09/11/19 0659  NA 137 141  K 5.3* 4.0  CL 100 103  CO2 23 26  GLUCOSE 209* 177*  BUN 10 6  CREATININE 0.79 0.69  CALCIUM 9.0 9.4   Liver Function Tests: Recent Labs  Lab 09/10/19 2150  AST 139*  ALT 105*  ALKPHOS 292*  BILITOT 1.7*  PROT 8.0  ALBUMIN 3.4*   No results for input(s): LIPASE, AMYLASE in the last 168 hours. No results for input(s): AMMONIA in the last 168 hours. CBC: Recent Labs  Lab 09/10/19 2150 09/11/19 0659  WBC 8.5 8.5  NEUTROABS 5.8  --   HGB 11.0* 10.7*  HCT 34.3* 32.6*  MCV 78.1* 75.8*   PLT 462* 607*   Cardiac Enzymes: No results for input(s): CKTOTAL, CKMB, CKMBINDEX, TROPONINI in the last 168 hours. BNP: Invalid input(s): POCBNP CBG: Recent Labs  Lab 09/11/19 1241 09/11/19 1559 09/11/19 2108 09/12/19 0557 09/12/19 1138  GLUCAP 176* 150* 223* 193* 151*   D-Dimer Recent Labs    09/11/19 1135  DDIMER 1.17*   Hgb A1c Recent Labs    09/11/19 0659  HGBA1C 8.7*   Lipid Profile Recent Labs    09/12/19 0211  CHOL 172  HDL 21*  LDLCALC 117*  TRIG 168*  CHOLHDL 8.2  Thyroid function studies No results for input(s): TSH, T4TOTAL, T3FREE, THYROIDAB in the last 72 hours.  Invalid input(s): FREET3 Anemia work up No results for input(s): VITAMINB12, FOLATE, FERRITIN, TIBC, IRON, RETICCTPCT in the last 72 hours. Urinalysis    Component Value Date/Time   COLORURINE YELLOW 09/11/2019 0319   APPEARANCEUR CLEAR 09/11/2019 0319   LABSPEC >1.046 (H) 09/11/2019 0319   PHURINE 6.0 09/11/2019 0319   GLUCOSEU NEGATIVE 09/11/2019 0319   HGBUR NEGATIVE 09/11/2019 0319   BILIRUBINUR NEGATIVE 09/11/2019 0319   KETONESUR 20 (A) 09/11/2019 0319   PROTEINUR 30 (A) 09/11/2019 0319   UROBILINOGEN 1.0 03/04/2007 1518   NITRITE NEGATIVE 09/11/2019 0319   LEUKOCYTESUR NEGATIVE 09/11/2019 0319   Sepsis Labs Invalid input(s): PROCALCITONIN,  WBC,  LACTICIDVEN Microbiology Recent Results (from the past 240 hour(s))  Respiratory Panel by RT PCR (Flu A&B, Covid) - Nasopharyngeal Swab     Status: None   Collection Time: 09/11/19  3:44 AM   Specimen: Nasopharyngeal Swab  Result Value Ref Range Status   SARS Coronavirus 2 by RT PCR NEGATIVE NEGATIVE Final    Comment: (NOTE) SARS-CoV-2 target nucleic acids are NOT DETECTED. The SARS-CoV-2 RNA is generally detectable in upper respiratoy specimens during the acute phase of infection. The lowest concentration of SARS-CoV-2 viral copies this assay can detect is 131 copies/mL. A negative result does not preclude  SARS-Cov-2 infection and should not be used as the sole basis for treatment or other patient management decisions. A negative result may occur with  improper specimen collection/handling, submission of specimen other than nasopharyngeal swab, presence of viral mutation(s) within the areas targeted by this assay, and inadequate number of viral copies (<131 copies/mL). A negative result must be combined with clinical observations, patient history, and epidemiological information. The expected result is Negative. Fact Sheet for Patients:  https://www.moore.com/ Fact Sheet for Healthcare Providers:  https://www.young.biz/ This test is not yet ap proved or cleared by the [place] FDA and  has been authorized for detection and/or diagnosis of SARS-CoV-2 by FDA under an Emergency Use Authorization (EUA). This EUA will remain  in effect (meaning this test can be used) for the duration of the COVID-19 declaration under Section 564(b)(1) of the Act, 21 U.S.C. section 360bbb-3(b)(1), unless the authorization is terminated or revoked sooner.    Influenza A by PCR NEGATIVE NEGATIVE Final   Influenza B by PCR NEGATIVE NEGATIVE Final    Comment: (NOTE) The Xpert Xpress SARS-CoV-2/FLU/RSV assay is intended as an aid in  the diagnosis of influenza from Nasopharyngeal swab specimens and  should not be used as a sole basis for treatment. Nasal washings and  aspirates are unacceptable for Xpert Xpress SARS-CoV-2/FLU/RSV  testing. Fact Sheet for Patients: https://www.moore.com/ Fact Sheet for Healthcare Providers: https://www.young.biz/ This test is not yet approved or cleared by the [place] FDA and  has been authorized for detection and/or diagnosis of SARS-CoV-2 by  FDA under an Emergency Use Authorization (EUA). This EUA will remain  in effect (meaning this test can be used) for the duration of the  Covid-19  declaration under Section 564(b)(1) of the Act, 21  U.S.C. section 360bbb-3(b)(1), unless the authorization is  terminated or revoked. Performed at [facility], [address] [place], [place] [address]      Time coordinating discharge:  39 minutes  SIGNED:   Alwyn Ren, MD  [facility] 09/12/2019, 12:40 PM Pager   If 7PM-7AM, please contact night-coverage [email or web address] Password [email or web address]

## 2019-09-12 NOTE — Progress Notes (Signed)
STROKE TEAM PROGRESS NOTE   INTERVAL HISTORY Patient is sitting up in a chair.  She is comfortable.  She has no questions.  Transcranial Doppler bubble study was attempted but patient has poor window and could not be done.  EEG study was negative for seizure activity and 2D echo was unremarkable.  Patient likely to be discharged today and will be follow-up in the stroke clinic in 6 weeks with plans for 30-day outpatient heart monitor for paroxysmal A. fib OBJECTIVE Vitals:   09/11/19 1100 09/11/19 1400 09/11/19 1500 09/12/19 0801  BP: 131/74  114/61 116/74  Pulse: 94  98 85  Resp: 18  18 20   Temp: 99.5 F (37.5 C)  98.5 F (36.9 C) 98.8 F (37.1 C)  TempSrc: Oral  Oral Oral  SpO2: 100%  99% 98%  Weight:  77.1 kg    Height:  5\' 3"  (1.6 m)      CBC:  Recent Labs  Lab 09/10/19 2150 09/11/19 0659  WBC 8.5 8.5  NEUTROABS 5.8  --   HGB 11.0* 10.7*  HCT 34.3* 32.6*  MCV 78.1* 75.8*  PLT 462* 607*    Basic Metabolic Panel:  Recent Labs  Lab 09/10/19 2150 09/11/19 0659  NA 137 141  K 5.3* 4.0  CL 100 103  CO2 23 26  GLUCOSE 209* 177*  BUN 10 6  CREATININE 0.79 0.69  CALCIUM 9.0 9.4    Lipid Panel:     Component Value Date/Time   CHOL 172 09/12/2019 0211   TRIG 168 (H) 09/12/2019 0211   HDL 21 (L) 09/12/2019 0211   CHOLHDL 8.2 09/12/2019 0211   VLDL 34 09/12/2019 0211   LDLCALC 117 (H) 09/12/2019 0211   HgbA1c:  Lab Results  Component Value Date   HGBA1C 8.7 (H) 09/11/2019   Urine Drug Screen:     Component Value Date/Time   LABOPIA NONE DETECTED 09/11/2019 0319   COCAINSCRNUR NONE DETECTED 09/11/2019 0319   LABBENZ NONE DETECTED 09/11/2019 0319   AMPHETMU NONE DETECTED 09/11/2019 0319   THCU NONE DETECTED 09/11/2019 0319   LABBARB NONE DETECTED 09/11/2019 0319    Alcohol Level     Component Value Date/Time   ETH <10 09/10/2019 2150    IMAGING  CT Angio Head W or Wo Contrast CT Angio Neck W and/or Wo Contrast 09/11/2019 IMPRESSION:  1.  Negative CTA for emergent large vessel occlusion.  2. 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 Dominant right vertebral artery remains widely patent.  3. Additional moderate diffuse atheromatous narrowing of the mid basilar artery.  4. Multifocal atheromatous change throughout the carotid siphons, with associated moderate to severe multifocal narrowing on the left and more moderate narrowing on the right. Both carotid artery systems remain widely patent within the neck.  5. Multifocal parenchymal opacities within the visualized lungs, consistent with multifocal pneumonia.   CT Head Wo Contrast 09/10/2019 IMPRESSION:  No acute intracranial pathology.   CT CHEST WO CONTRAST 09/11/2019 IMPRESSION:  Widely dispersed patchy pulmonary infiltrates typical of those seen in coronavirus infection. No lobar consolidation or collapse. Multiple nonobstructing renal calculi. Probable gallstones. Aortic atherosclerosis.  Coronary artery calcification.  MR BRAIN WO CONTRAST 09/11/2019 IMPRESSION:  1. Few tiny punctate foci of diffusion abnormality involving the parieto-occipital regions bilaterally as above, suspicious for tiny acute ischemic infarcts given patient's symptoms. No associated hemorrhage or mass effect.  2. Underlying age-related cerebral atrophy  with chronic microvascular ischemic disease, with additional chronic left thalamic lacunar infarct.   DG Chest Port 1 View 09/11/2019 IMPRESSION:  Patchy bilateral airspace opacities most compatible with history of COVID-19.   US Abdomen Limited RUQ 09/11/2019 IMPRESSION:  1. Single 1.6 cm mobile gallstone. No additional findings to suggest acute cholecystitis.  2. Suggestion of mild hepatic steatosis.  No focal mass.    Transthoracic Echocardiogram  1. Left ventricular ejection fraction, by visual estimation, is 60  to  65%. The left ventricle has normal function. There is mildly increased  left ventricular hypertrophy.  2. The left ventricle has no regional wall motion abnormalities.  3. Global right ventricle has normal systolic function.The right  ventricular size is normal. No increase in right ventricular wall  thickness.  4. Left atrial size was normal.  5. Right atrial size was normal.  6. The mitral valve is abnormal. Trivial mitral valve regurgitation.  7. The tricuspid valve is normal in structure.  8. The tricuspid valve is normal in structure. Tricuspid valve  regurgitation is trivial.  9. The aortic valve is tricuspid. Aortic valve regurgitation is not  visualized. Mild aortic valve sclerosis without stenosis.  10. Pulmonic regurgitation is mild.  11. The pulmonic valve was normal in structure. Pulmonic valve  regurgitation is mild.  12. The inferior vena cava is normal in size with greater than 50%  respiratory variability, suggesting right atrial pressure of 3 mmHg.  ECG - SR rate 91 BPM. (See cardiology reading for complete details)  EEG - This study is within normal limits. No seizures or epileptiform discharges were seen throughout the recording.   PHYSICAL EXAM  Temp:  [98.2 F (36.8 C)-99.5 F (37.5 C)] 98.8 F (37.1 C) (02/01 0801) Pulse Rate:  [85-98] 85 (02/01 0801) Resp:  [16-20] 20 (02/01 0801) BP: (114-149)/(61-74) 116/74 (02/01 0801) SpO2:  [98 %-100 %] 98 % (02/01 0801) Weight:  [77.1 kg] 77.1 kg (01/31 1400)  General - Well nourished, well developed pleasant middle-aged lady, in no apparent distress.  Ophthalmologic - fundi not visualized due to noncooperation.  Cardiovascular - Regular rhythm and rate.  Mental Status -  Level of arousal and orientation to time, place, and person were intact. Language including expression, naming, repetition, comprehension was assessed and found intact. Fund of Knowledge was assessed and was intact.  Cranial  Nerves II - XII - II - Visual field intact OU. III, IV, VI - Extraocular movements intact. V - Facial sensation intact bilaterally. VII - Facial movement intact bilaterally. VIII - Hearing & vestibular intact bilaterally. X - Palate elevates symmetrically. XI - Chin turning & shoulder shrug intact bilaterally. XII - Tongue protrusion intact.  Motor Strength - The patient's strength was normal in all extremities and pronator drift was absent.  Bulk was normal and fasciculations were absent.   Motor Tone - Muscle tone was assessed at the neck and appendages and was normal.  Reflexes - The patient's reflexes were symmetrical in all extremities and she had no pathological reflexes.  Sensory - Light touch, temperature/pinprick were assessed and were symmetrical.    Coordination - The patient had normal movements in the hands and feet with no ataxia or dysmetria.  Tremor was absent.  Gait and Station - deferred.   ASSESSMENT/PLAN Amy Raymond is a 54 y.o. female with history of diabetes mellitus, thyroid disease and migraines with visual aura presents to the emergency room with recent transient episodes of inability to speak and sudden onset vision  loss in both eyes followed by loss of consciousness.  She did not receive IV t-PA due to resolution of deficits.   Stroke: tiny punctate foci of diffusion abnormality involving the parieto-occipital regions bilaterally - embolic - unknown source   CT head  - no acute findings.  MRI head - Few tiny punctate foci of diffusion abnormality involving the parieto-occipital regions bilaterally as above, suspicious for tiny acute ischemic infarcts. Underlying age-related cerebral atrophy with chronic microvascular ischemic disease, with additional chronic left thalamic lacunar infarct.   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 Moderate diffuse atheromatous narrowing of the mid basilar artery.   EEG normal EEG   LE venous doppler negative for DVT prelim report  TCD bubble study poor windows not done  2D Echo EF 60-65%  May consider 30 day cardiac monitoring if above work up unrevealing.  Sars Corona Virus 2 - negative  LDL - 117   HgbA1c - 8.7  UDS - negative  VTE prophylaxis - Lovenox  aspirin 81 mg daily prior to admission (reported per pt during rounds), now on aspirin 81 mg daily and clopidogrel 75 mg daily. Continue DAPT x 3 months then PLAVIX alone     Therapy recommendations:  No PT or OT  Disposition:  Pending  Hypertension  Home BP meds: none   Current BP meds: none   BP 170s on presentation, now stable . Long-term BP goal normotensive  Hyperlipidemia  Home Lipid lowering medication: none   LDL - 117, goal < 70  Current lipid lowering medication: Lipitor 40 mg daily   Continue statin at discharge  Diabetes  Home diabetic meds: Jentadueto and glucatrol  Current diabetic meds: insulin, linagliptin and metformin  HgbA1c 8.7, goal < 7.0  hyperglycemia   Close PCP follow up for better DM control  Other Stroke Risk Factors  Obesity, recommend weight loss, diet and exercise as appropriate   Migraines  Other Active Problems  Code status - Full code  Iron deficiency anemia - Hb - 10.7 - resume iron pills  Chronic left thalamic lacunar infarct by MRI  Hospital day # 1  I have personally obtained history,examined this patient, reviewed notes, independently viewed imaging studies, participated in medical decision making and plan of care.ROS completed by me personally and pertinent positives fully documented  I have made any additions or clarifications directly to the above note.  She presented with sudden onset of vertigo but MRI shows by cerebral parieto-occipital embolic infarcts of unidentified source.  Recommend outpatient 30-day heart monitor  for paroxysmal A. fib.  Discharged home on dual antiplatelet therapy for 3 weeks followed by Plavix alone.  Consider outpatient participation in the [place] trial if interested. I have spent a total of   25 minutes with the patient reviewing hospital notes,  test results, labs and examining the patient as well as establishing an assessment and plan that was discussed personally with the patient.  > 50% of time was spent in direct patient care.  Discussed with Dr. Ashley Royalty   Follow-up in the stroke clinic in 6 weeks stroke team will sign off.  Kindly call for questions. Delia Heady, MD Medical Director [facility] Pager: [phone] 09/12/2019 1:46 PM   To contact Stroke Continuity provider, please refer to [email or web address]. After hours, contact General Neurology

## 2019-09-12 NOTE — Evaluation (Addendum)
Physical Therapy Evaluation Patient Details Name: Amy Raymond MRN: [id number] DOB: 1965/10/09 Today's Date: 09/12/2019   History of Present Illness  Pt is a 54 yo female s/p severe migraine x2 weeks with sudden loss of vision recently. EEG (-) seizures. MRI brain suspicious tiny acute ischemic infarcts with chronic L thalamic lacunar infarct. PMHx: HTN, DMT2.    Clinical Impression  PT eval complete. Pt independent to modified independent with all functional mobility. Strength symmetrical. Sensation intact. Pt reports she feels back to baseline. Reviewed BEFAST. No further skilled PT intervention indicated. PT signing off.    Follow Up Recommendations No PT follow up    Equipment Recommendations  None recommended by PT    Recommendations for Other Services       Precautions / Restrictions Precautions Precautions: None      Mobility  Bed Mobility Overal bed mobility: Independent                Transfers Overall transfer level: Independent                  Ambulation/Gait Ambulation/Gait assistance: Independent Gait Distance (Feet): 350 Feet Assistive device: None Gait Pattern/deviations: WFL(Within Functional Limits) Gait velocity: WFL Gait velocity interpretation: >2.62 ft/sec, indicative of community ambulatory    Stairs            Wheelchair Mobility    Modified Rankin (Stroke Patients Only) Modified Rankin (Stroke Patients Only) Pre-Morbid Rankin Score: No symptoms Modified Rankin: No significant disability     Balance Overall balance assessment: Modified Independent                                           Pertinent Vitals/Pain Pain Assessment: No/denies pain    Home Living Family/patient expects to be discharged to:: Private residence Living Arrangements: Alone Available Help at Discharge: Family;Available PRN/intermittently Type of Home: House Home Access: Stairs to enter   [employer] of  Steps: 3 Home Layout: One level Home Equipment: None      Prior Function Level of Independence: Independent         Comments: Driving     Hand Dominance   Dominant Hand: Right    Extremity/Trunk Assessment   Upper Extremity Assessment Upper Extremity Assessment: Overall WFL for tasks assessed    Lower Extremity Assessment Lower Extremity Assessment: Overall WFL for tasks assessed    Cervical / Trunk Assessment Cervical / Trunk Assessment: Normal  Communication   Communication: No difficulties  Cognition Arousal/Alertness: Awake/alert Behavior During Therapy: WFL for tasks assessed/performed Overall Cognitive Status: Within Functional Limits for tasks assessed                                        General Comments      Exercises     Assessment/Plan    PT Assessment Patent does not need any further PT services  PT Problem List         PT Treatment Interventions      PT Goals (Current goals can be found in the Care Plan section)  Acute Rehab PT Goals Patient Stated Goal: home today PT Goal Formulation: All assessment and education complete, DC therapy    Frequency     Barriers to discharge        Co-evaluation  AM-PAC PT "6 Clicks" Mobility  Outcome Measure Help needed turning from your back to your side while in a flat bed without using bedrails?: None Help needed moving from lying on your back to sitting on the side of a flat bed without using bedrails?: None Help needed moving to and from a bed to a chair (including a wheelchair)?: None Help needed standing up from a chair using your arms (e.g., wheelchair or bedside chair)?: None Help needed to walk in hospital room?: None Help needed climbing 3-5 steps with a railing? : None 6 Click Score: 24    End of Session   Activity Tolerance: Patient tolerated treatment well Patient left: in chair;with call bell/phone within reach   PT Visit Diagnosis: Other  abnormalities of gait and mobility (R26.89)    Time: 6579-0383 PT Time Calculation (min) (ACUTE ONLY): 13 min   Charges:   PT Evaluation $PT Eval Low Complexity: 1 Low          Lorrin Goodell, PT  Office # [phone] Pager [phone]   Lorriane Shire 09/12/2019, 9:00 AM

## 2019-09-12 NOTE — Progress Notes (Signed)
PROGRESS NOTE    Amy MerlesLaura A Raymond  ZOX:[id number] DOB: 1965/11/03 DOA: 09/10/2019 PCP: [place], Julie M, NP    Brief Narrative: 54 year old female with history of type 2 diabetes and hypertension admitted with severe headache with acute onset of vision loss in both eyes.  She has history of migraine.  Assessment & Plan:   Principal Problem:   CVA (cerebral vascular accident) (HCC) Active Problems:   Intractable headache   Hypothyroidism   Type 2 diabetes mellitus without complication (HCC)   Elevated liver enzymes   #1 stroke patient presented with inability to speak with sudden onset of vision loss in both eyes followed by loss of consciousness-MRI showed tiny punctuate foci of diffusion abnormality involving the parieto-occipital regions bilaterally suspecting embolic etiology.  CT head showed no acute findings. 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 EEG normal. Echo Venous Doppler-no evidence of DVT LDL 117 Hemoglobin A1c is 8.7 She was seen by PT and OT did not recommend any therapy on discharge.  #2 type 2 diabetes hemoglobin A1c is 8.7 she reports that one of her medications were not covered by her insurance so her primary care physician is working on it.  I will continue home medications.  #3 hypertension-blood pressure 130/80  #4 hyperlipidemia LDL is 117.  On Lipitor 40 mg daily continue on discharge.    Estimated body mass index is 30.11 kg/m as calculated from the following:   Height as of this encounter: 5\' 3"  (1.6 m).   Weight as of this encounter: 77.1 kg.  DVT prophylaxis: Lovenox Code Status: Full code  family Communication: None  disposition Plan: Patient came from home plans to return to home.  CT abdomen and pelvis pending to  rule out mass in the pelvic area prior to discharge. Consultants:   Neurology  Procedures: None Antimicrobials: None Subjective: She is resting in bed feels better than yesterday still having a headache  Objective: Vitals:   09/12/19 0801 09/12/19 1211 09/12/19 1655 09/12/19 1955  BP: 116/74 130/80 131/75 (!) 156/73  Pulse: 85 86 86 93  Resp: 20 16 18  (!) 22  Temp: 98.8 F (37.1 C) 98.4 F (36.9 C) 98 F (36.7 C) 98.6 F (37 C)  TempSrc: Oral Oral Oral Oral  SpO2: 98% 99% 100% 99%  Weight:      Height:        Intake/Output Summary (Last 24 hours) at 09/12/2019 2030 Last data filed at 09/11/2019 2145 Gross per 24 hour  Intake 3 ml  Output --  Net 3 ml   Filed Weights   09/11/19 1400  Weight: 77.1 kg    Examination:  General exam: Appears calm and comfortable  Respiratory system: Clear to auscultation. Respiratory effort normal. Cardiovascular system: S1 & S2 heard, RRR. No JVD, murmurs, rubs, gallops or clicks. No pedal edema. Gastrointestinal system: Abdomen is nondistended, soft and nontender. No organomegaly or masses felt. Normal bowel sounds heard. Central nervous system: Alert and oriented. No focal neurological deficits. Extremities: Symmetric 5 x 5 power. Skin: No rashes, lesions or ulcers Psychiatry: Judgement and insight appear normal. Mood & affect appropriate.     Data Reviewed: I have personally reviewed following labs and imaging studies  CBC: Recent Labs  Lab 09/10/19 2150 09/11/19 0659  WBC 8.5 8.5  NEUTROABS 5.8  --  HGB 11.0* 10.7*  HCT 34.3* 32.6*  MCV 78.1* 75.8*  PLT 462* 607*   Basic Metabolic Panel: Recent Labs  Lab 09/10/19 2150 09/11/19 0659  NA 137 141  K 5.3* 4.0  CL 100 103  CO2 23 26  GLUCOSE 209* 177*  BUN 10 6  CREATININE 0.79 0.69  CALCIUM 9.0 9.4   GFR: Estimated Creatinine Clearance: 78.1 mL/min (by C-G formula based on SCr of 0.69 mg/dL). Liver Function Tests: Recent Labs  Lab 09/10/19 2150  AST 139*   ALT 105*  ALKPHOS 292*  BILITOT 1.7*  PROT 8.0  ALBUMIN 3.4*   No results for input(s): LIPASE, AMYLASE in the last 168 hours. No results for input(s): AMMONIA in the last 168 hours. Coagulation Profile: Recent Labs  Lab 09/10/19 2150  INR 1.0   Cardiac Enzymes: No results for input(s): CKTOTAL, CKMB, CKMBINDEX, TROPONINI in the last 168 hours. BNP (last 3 results) No results for input(s): PROBNP in the last 8760 hours. HbA1C: Recent Labs    09/11/19 0659  HGBA1C 8.7*   CBG: Recent Labs  Lab 09/11/19 1559 09/11/19 2108 09/12/19 0557 09/12/19 1138 09/12/19 1654  GLUCAP 150* 223* 193* 151* 148*   Lipid Profile: Recent Labs    09/12/19 0211  CHOL 172  HDL 21*  LDLCALC 117*  TRIG 168*  CHOLHDL 8.2   Thyroid Function Tests: No results for input(s): TSH, T4TOTAL, FREET4, T3FREE, THYROIDAB in the last 72 hours. Anemia Panel: No results for input(s): VITAMINB12, FOLATE, FERRITIN, TIBC, IRON, RETICCTPCT in the last 72 hours. Sepsis Labs: Recent Labs  Lab 09/11/19 1135  PROCALCITON <0.10    Recent Results (from the past 240 hour(s))  Respiratory Panel by RT PCR (Flu A&B, Covid) - Nasopharyngeal Swab     Status: None   Collection Time: 09/11/19  3:44 AM   Specimen: Nasopharyngeal Swab  Result Value Ref Range Status   SARS Coronavirus 2 by RT PCR NEGATIVE NEGATIVE Final    Comment: (NOTE) SARS-CoV-2 target nucleic acids are NOT DETECTED. The SARS-CoV-2 RNA is generally detectable in upper respiratoy specimens during the acute phase of infection. The lowest concentration of SARS-CoV-2 viral copies this assay can detect is 131 copies/mL. A negative result does not preclude SARS-Cov-2 infection and should not be used as the sole basis for treatment or other patient management decisions. A negative result may occur with  improper specimen collection/handling, submission of specimen other than nasopharyngeal swab, presence of viral mutation(s) within the areas  targeted by this assay, and inadequate number of viral copies (<131 copies/mL). A negative result must be combined with clinical observations, patient history, and epidemiological information. The expected result is Negative. Fact Sheet for Patients:  https://www.moore.com/ Fact Sheet for Healthcare Providers:  https://www.young.biz/ This test is not yet ap proved or cleared by the [place] FDA and  has been authorized for detection and/or diagnosis of SARS-CoV-2 by FDA under an Emergency Use Authorization (EUA). This EUA will remain  in effect (meaning this test can be used) for the duration of the COVID-19 declaration under Section 564(b)(1) of the Act, 21 U.S.C. section 360bbb-3(b)(1), unless the authorization is terminated or revoked sooner.    Influenza A by PCR NEGATIVE NEGATIVE Final   Influenza B by PCR NEGATIVE NEGATIVE Final    Comment: (NOTE) The Xpert Xpress SARS-CoV-2/FLU/RSV assay is intended as an aid in  the diagnosis of influenza from Nasopharyngeal swab specimens and  should not be used as a sole basis for treatment. Nasal washings  and  aspirates are unacceptable for Xpert Xpress SARS-CoV-2/FLU/RSV  testing. Fact Sheet for Patients: https://www.moore.com/ Fact Sheet for Healthcare Providers: https://www.young.biz/ This test is not yet approved or cleared by the [place] FDA and  has been authorized for detection and/or diagnosis of SARS-CoV-2 by  FDA under an Emergency Use Authorization (EUA). This EUA will remain  in effect (meaning this test can be used) for the duration of the  Covid-19 declaration under Section 564(b)(1) of the Act, 21  U.S.C. section 360bbb-3(b)(1), unless the authorization is  terminated or revoked. Performed at [facility], [address] [place], [place] [address]          Radiology Studies: EEG  Result Date: 09/11/2019 Charlsie Quest, MD     09/11/2019 11:39 AM Patient Name: Amy Raymond MRN: [id number] Epilepsy Attending: Charlsie Quest Referring Physician/Provider: Dr Milon Dikes Date: 09/11/2019 Duration: 23.34 mins Patient history: 54 y.o. female with past medical history significant for diabetes mellitus, thyroid disease and migraines with visual aura presents to the emergency room with sudden onset inability to get words out followed by dizziness and facial numbness.  She had a previous episode of bilateral vision loss. Then had an episode of staring spell. EEG to assess for seizure. Level of alertness: Awake AEDs during EEG study: None Technical aspects: This EEG study was done with scalp electrodes positioned according to the 10-20 International system of electrode placement. Electrical activity was acquired at a sampling rate of 500Hz  and reviewed with a high frequency filter of 70Hz  and a low frequency filter of 1Hz . EEG data were recorded continuously and digitally stored. DESCRIPTION: The posterior dominant rhythm consists of 9-10 Hz activity of moderate voltage (25-35 uV) seen predominantly in posterior head regions, symmetric and reactive to eye opening and eye closing. Physiologic photic driving was seen during photic stimulation. Hyperventilation was not performed due to acute CVA. IMPRESSION: This study is within normal limits. No seizures or epileptiform discharges were seen throughout the recording.   CT Angio Head W or Wo Contrast  Result Date: 09/11/2019 CLINICAL DATA:  Initial evaluation for acute headache, syncope. EXAM: CT ANGIOGRAPHY HEAD AND NECK TECHNIQUE: Multidetector CT imaging of the head and neck was performed using the standard protocol during bolus administration of intravenous contrast. Multiplanar CT image reconstructions and MIPs were obtained to evaluate the vascular anatomy. Carotid stenosis measurements (when applicable) are obtained utilizing NASCET criteria, using the distal  internal carotid diameter as the denominator. CONTRAST:  80mL OMNIPAQUE IOHEXOL 350 MG/ML SOLN COMPARISON:  Prior noncontrast head CT from earlier the same day. FINDINGS: CTA NECK FINDINGS Aortic arch: Visualized aortic arch of normal caliber with normal branch pattern. No hemodynamically significant stenosis seen about the origin of the great vessels. Visualized subclavian arteries widely patent. Right carotid system: Right common and internal carotid arteries widely patent without stenosis, dissection, or occlusion. Left carotid system: Mild eccentric soft plaque within the mid left CCA with no more than mild stenosis. Left common and internal carotid arteries otherwise widely patent without stenosis, dissection or occlusion. Vertebral arteries: Both vertebral arteries arise from the subclavian arteries. Right vertebral artery dominant and widely patent within the neck. Left vertebral artery diffusely hypoplastic and largely occluded at its origin. Attenuated and irregular distal reconstitution at the left V2 segment, with irregular attenuated flow distally within the neck. Left vertebral is grossly patent as it courses into the skull base. Skeleton: No acute osseous abnormality. No discrete or worrisome  osseous lesions. Mild cervical spondylosis at C3-4 through C5-6. Other neck: No other acute soft tissue abnormality within the neck. No mass lesion or adenopathy. Upper chest: Multifocal patchy parenchymal opacity seen within the peripheral upper lobes bilaterally, right greater than left, concerning for multifocal pneumonia. Visualized upper chest demonstrates no other acute finding. Review of the MIP images confirms the above findings CTA HEAD FINDINGS Anterior circulation: Petrous segments widely patent. Multifocal atheromatous plaque throughout the cavernous/supraclinoid ICAs bilaterally. Associated up to moderate approximate 50% stenosis at the cavernous right ICA (series 13, image 429). On the left, there is  resultant moderate to severe multifocal stenoses (series 13, image 428). A1 segments patent bilaterally. Normal anterior communicating artery. Anterior cerebral arteries patent to their distal aspects without stenosis. No M1 stenosis or occlusion. Normal MCA bifurcations. Distal MCA branches well perfused and symmetric. Posterior circulation: Dominant right vertebral artery patent to the vertebrobasilar junction without stenosis. Patent right PICA. Left vertebral artery attenuated but patent at the skull base, but subsequently occludes, and remains occluded to the vertebrobasilar junction. Left PICA not seen. Moderate diffuse stenosis noted at the mid basilar artery (series 13, image 430). Superior cerebral arteries patent bilaterally. Both PCAs primarily supplied via the basilar and are well perfused to their distal aspects. Small right posterior communicating artery noted. Venous sinuses: Patent. 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 No appreciable intracranial aneurysm. Review of the MIP images confirms the above findings IMPRESSION: 1. Negative CTA for emergent large vessel occlusion. 2. 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 Dominant right vertebral artery remains widely patent. 3. Additional moderate diffuse atheromatous narrowing of the mid basilar artery. 4. Multifocal atheromatous change throughout the carotid siphons, with associated moderate to severe multifocal narrowing on the left and more moderate narrowing on the right. Both carotid artery systems remain widely patent within the neck. 5. Multifocal parenchymal opacities within the visualized lungs, consistent with multifocal pneumonia. Electronically Signed   By: Rise Mu M.D.   On: 09/11/2019 00:15   CT Head Wo Contrast  Result Date:  09/10/2019 CLINICAL DATA:  Found unresponsive. EXAM: CT HEAD WITHOUT CONTRAST TECHNIQUE: Contiguous axial images were obtained from the base of the skull through the vertex without intravenous contrast. COMPARISON:  May 31, 2012 FINDINGS: Brain: No evidence of acute infarction, hemorrhage, hydrocephalus, extra-axial collection or mass lesion/mass effect. Vascular: No hyperdense vessel or unexpected calcification. Skull: Normal. Negative for fracture or focal lesion. Sinuses/Orbits: No acute finding. Other: None. IMPRESSION: No acute intracranial pathology. Electronically Signed   By: Aram Candela M.D.   On: 09/10/2019 22:28   CT Angio Neck W and/or Wo Contrast  Result Date: 09/11/2019 CLINICAL DATA:  Initial evaluation for acute headache, syncope. EXAM: CT ANGIOGRAPHY HEAD AND NECK TECHNIQUE: Multidetector CT imaging of the head and neck was performed using the standard protocol during bolus administration of intravenous contrast. Multiplanar CT image reconstructions and MIPs were obtained to evaluate the vascular anatomy. Carotid stenosis measurements (when applicable) are obtained utilizing NASCET criteria, using the distal internal carotid diameter as the denominator. CONTRAST:  75mL OMNIPAQUE IOHEXOL 350 MG/ML SOLN COMPARISON:  Prior noncontrast head CT from earlier the same day. FINDINGS: CTA NECK FINDINGS Aortic arch: Visualized aortic arch of normal caliber with normal branch pattern. No hemodynamically significant stenosis seen about the origin of the great vessels. Visualized subclavian arteries widely patent. Right carotid system: Right common  and internal carotid arteries widely patent without stenosis, dissection, or occlusion. Left carotid system: Mild eccentric soft plaque within the mid left CCA with no more than mild stenosis. Left common and internal carotid arteries otherwise widely patent without stenosis, dissection or occlusion. Vertebral arteries: Both vertebral arteries arise from  the subclavian arteries. Right vertebral artery dominant and widely patent within the neck. Left vertebral artery diffusely hypoplastic and largely occluded at its origin. Attenuated and irregular distal reconstitution at the left V2 segment, with irregular attenuated flow distally within the neck. Left vertebral is grossly patent as it courses into the skull base. Skeleton: No acute osseous abnormality. No discrete or worrisome osseous lesions. Mild cervical spondylosis at C3-4 through C5-6. Other neck: No other acute soft tissue abnormality within the neck. No mass lesion or adenopathy. Upper chest: Multifocal patchy parenchymal opacity seen within the peripheral upper lobes bilaterally, right greater than left, concerning for multifocal pneumonia. Visualized upper chest demonstrates no other acute finding. Review of the MIP images confirms the above findings CTA HEAD FINDINGS Anterior circulation: Petrous segments widely patent. Multifocal atheromatous plaque throughout the cavernous/supraclinoid ICAs bilaterally. Associated up to moderate approximate 50% stenosis at the cavernous right ICA (series 13, image 429). On the left, there is resultant moderate to severe multifocal stenoses (series 13, image 428). A1 segments patent bilaterally. Normal anterior communicating artery. Anterior cerebral arteries patent to their distal aspects without stenosis. No M1 stenosis or occlusion. Normal MCA bifurcations. Distal MCA branches well perfused and symmetric. Posterior circulation: Dominant right vertebral artery patent to the vertebrobasilar junction without stenosis. Patent right PICA. Left vertebral artery attenuated but patent at the skull base, but subsequently occludes, and remains occluded to the vertebrobasilar junction. Left PICA not seen. Moderate diffuse stenosis noted at the mid basilar artery (series 13, image 430). Superior cerebral arteries patent bilaterally. Both PCAs primarily supplied via the basilar  and are well perfused to their distal aspects. Small right posterior communicating artery noted. Venous sinuses: Patent. 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 No appreciable intracranial aneurysm. Review of the MIP images confirms the above findings IMPRESSION: 1. Negative CTA for emergent large vessel occlusion. 2. 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 Dominant right vertebral artery remains widely patent. 3. Additional moderate diffuse atheromatous narrowing of the mid basilar artery. 4. Multifocal atheromatous change throughout the carotid siphons, with associated moderate to severe multifocal narrowing on the left and more moderate narrowing on the right. Both carotid artery systems remain widely patent within the neck. 5. Multifocal parenchymal opacities within the visualized lungs, consistent with multifocal pneumonia. Electronically Signed   By: Rise Mu M.D.   On: 09/11/2019 00:15   CT CHEST WO CONTRAST  Result Date: 09/11/2019 CLINICAL DATA:  Pneumonia EXAM: CT CHEST WITHOUT CONTRAST TECHNIQUE: Multidetector CT imaging of the chest was performed following the standard protocol without IV contrast. COMPARISON:  Radiography same day FINDINGS: Cardiovascular: Heart size is normal. There is some coronary artery calcification. There is some aortic atherosclerotic calcification. No sign of aneurysm. No pericardial fluid. Mediastinum/Nodes: No mass or lymphadenopathy seen on this noncontrast study. Lungs/Pleura: Widely dispersed patchy pulmonary infiltrates with a somewhat peripheral prominent distribution, typical of infiltrates seen with coronavirus infection. No lobar consolidation or collapse. No pleural effusion. No cavitation. Upper Abdomen: Multiple bilateral nonobstructing renal calculi. Probable  gallstones. Musculoskeletal: Negative IMPRESSION: Widely dispersed patchy pulmonary infiltrates typical  of those seen in coronavirus infection. No lobar consolidation or collapse. Multiple nonobstructing renal calculi. Probable gallstones. Aortic atherosclerosis.  Coronary artery calcification. Electronically Signed   By: Nelson Chimes M.D.   On: 09/11/2019 08:44   MR BRAIN WO CONTRAST  Result Date: 09/11/2019 CLINICAL DATA:  Initial evaluation for acute headache, bilateral visual loss, syncope. EXAM: MRI HEAD WITHOUT CONTRAST TECHNIQUE: Multiplanar, multiecho pulse sequences of the brain and surrounding structures were obtained without intravenous contrast. COMPARISON:  Prior CT and CTA from 09/10/2019. FINDINGS: Brain: Generalized age-related cerebral atrophy. Patchy T2/FLAIR hyperintensities seen within the periventricular, deep, and subcortical white matter both cerebral hemispheres, nonspecific, but most like related chronic microvascular ischemic disease, mild in nature. Superimposed remote lacunar infarct present within the left thalamus. Subtle patchy diffusion abnormality seen involving the cortical and subcortical aspect of the bilateral parieto-occipital regions (series 5, image 72, 66 on axial DWI sequence, series 7, image 37 on coronal DWI sequence, suspicious for tiny acute ischemic infarcts. ADC correlate difficult to visualize given small size. No associated hemorrhage. No other evidence for acute or subacute ischemia. Gray-white matter differentiation otherwise maintained. No foci of susceptibility artifact to suggest acute or chronic intracranial hemorrhage. No mass lesion, midline shift or mass effect. No hydrocephalus. No extra-axial fluid collection. Pituitary gland suprasellar region normal. Midline structures intact. Vascular: Loss of normal flow void within the hypoplastic left vertebral artery, consistent with previously identified occlusion. Major intracranial vascular flow voids  otherwise maintained. Skull and upper cervical spine: Craniocervical junction within normal limits. Bone marrow signal intensity normal. No scalp soft tissue abnormality. Sinuses/Orbits: Globes and orbital soft tissues within normal limits. Mild scattered mucosal thickening noted within the ethmoidal air cells. Paranasal sinuses are otherwise clear. No mastoid effusion. Inner ear structures grossly normal. Other: None. IMPRESSION: 1. Few tiny punctate foci of diffusion abnormality involving the parieto-occipital regions bilaterally as above, suspicious for tiny acute ischemic infarcts given patient's symptoms. No associated hemorrhage or mass effect. 2. Underlying age-related cerebral atrophy with chronic microvascular ischemic disease, with additional chronic left thalamic lacunar infarct. Electronically Signed   By: Jeannine Boga M.D.   On: 09/11/2019 02:17   DG Chest Port 1 View  Result Date: 09/11/2019 CLINICAL DATA:  Pulmonary opacities on recent neck CT. COVID exposure. EXAM: PORTABLE CHEST 1 VIEW COMPARISON:  CT neck 09/10/2019; chest radiograph 05/31/2012 FINDINGS: Monitoring leads overlie the patient. Stable cardiac and mediastinal contours. Patchy bilateral airspace opacities are demonstrated. No pleural effusion or pneumothorax. IMPRESSION: Patchy bilateral airspace opacities most compatible with history of COVID-19. Electronically Signed   By: Lovey Newcomer M.D.   On: 09/11/2019 04:12   ECHOCARDIOGRAM COMPLETE BUBBLE STUDY  Result Date: 09/11/2019   ECHOCARDIOGRAM REPORT   Patient Name:   VALENTINA ALCOSER Date of Exam: 09/11/2019 Medical Rec #:  [id number]       Height:       62.0 in Accession #:    [id number]      Weight:       178.1 lb Date of Birth:  [birth year]       BSA:          1.82 m Patient Age:    54 years        BP:           131/74 mmHg Patient Gender: F               HR:           94 bpm. Exam Location:  Inpatient Portions of this table do not appear on this page. Procedure: 2D Echo  Indications:    PFO  History:        Patient has no prior history of Echocardiogram examinations.                 Risk Factors:Diabetes.  Sonographer:    Celene Skeen RDCS (AE) Referring Phys: [id number] MIRCEA G CRISTESCU IMPRESSIONS  1. Left ventricular ejection fraction, by visual estimation, is 60 to 65%. The left ventricle has normal function. There is mildly increased left ventricular hypertrophy.  2. The left ventricle has no regional wall motion abnormalities.  3. Global right ventricle has normal systolic function.The right ventricular size is normal. No increase in right ventricular wall thickness.  4. Left atrial size was normal.  5. Right atrial size was normal.  6. The mitral valve is abnormal. Trivial mitral valve regurgitation.  7. The tricuspid valve is normal in structure.  8. The tricuspid valve is normal in structure. Tricuspid valve regurgitation is trivial.  9. The aortic valve is tricuspid. Aortic valve regurgitation is not visualized. Mild aortic valve sclerosis without stenosis. 10. Pulmonic regurgitation is mild. 11. The pulmonic valve was normal in structure. Pulmonic valve regurgitation is mild. 12. The inferior vena cava is normal in size with greater than 50% respiratory variability, suggesting right atrial pressure of 3 mmHg. FINDINGS  Left Ventricle: Left ventricular ejection fraction, by visual estimation, is 60 to 65%. The left ventricle has normal function. The left ventricle has no regional wall motion abnormalities. The left ventricular internal cavity size was the left ventricle is normal in size. There is mildly increased left ventricular hypertrophy. Right Ventricle: The right ventricular size is normal. No increase in right ventricular wall thickness. Global RV systolic function is has normal systolic function. Left Atrium: Left atrial size was normal in size. Right Atrium: Right atrial size was normal in size Pericardium: There is no evidence of pericardial effusion. Mitral  Valve: The mitral valve is abnormal. There is mild thickening of the mitral valve leaflet(s). Trivial mitral valve regurgitation. Tricuspid Valve: The tricuspid valve is normal in structure. Tricuspid valve regurgitation is trivial. Aortic Valve: The aortic valve is tricuspid. Aortic valve regurgitation is not visualized. Mild aortic valve sclerosis is present, with no evidence of aortic valve stenosis. Pulmonic Valve: The pulmonic valve was normal in structure. Pulmonic valve regurgitation is mild. Pulmonic regurgitation is mild. Aorta: The aortic root is normal in size and structure. Venous: The inferior vena cava is normal in size with greater than 50% respiratory variability, suggesting right atrial pressure of 3 mmHg. IAS/Shunts: No atrial level shunt detected by color flow Doppler.  LEFT VENTRICLE PLAX 2D LVIDd:         2.50 cm  Diastology LVIDs:         1.80 cm  LV e' lateral:   10.70 cm/s LV PW:         1.20 cm  LV E/e' lateral: 5.9 LV IVS:        1.30 cm LVOT diam:     2.00 cm LV SV:         13 ml LV SV Index:   6.59 LVOT Area:     3.14 cm  RIGHT VENTRICLE RV S prime:     12.70 cm/s TAPSE (M-mode): 1.9 cm LEFT ATRIUM             Index       RIGHT ATRIUM  Index LA diam:        3.00 cm 1.65 cm/m  RA Area:     7.57 cm LA Vol (A2C):   22.5 ml 12.36 ml/m RA Volume:   11.50 ml 6.32 ml/m LA Vol (A4C):   18.2 ml 10.00 ml/m LA Biplane Vol: 21.1 ml 11.59 ml/m  AORTIC VALVE LVOT Vmax:   69.20 cm/s LVOT Vmean:  55.400 cm/s LVOT VTI:    0.120 m  AORTA Ao Root diam: 3.30 cm MITRAL VALVE MV Area (PHT): 1.75 cm             SHUNTS MV PHT:        125.57 msec          Systemic VTI:  0.12 m MV Decel Time: 433 msec             Systemic Diam: 2.00 cm MV E velocity: 63.00 cm/s 103 cm/s MV A velocity: 96.40 cm/s 70.3 cm/s MV E/A ratio:  0.65       1.5  Dietrich Pates MD Electronically signed by Dietrich Pates MD Signature Date/Time: 09/11/2019/4:23:28 PM    Final    VAS [place] LOWER EXTREMITY VENOUS (DVT)  Result Date:  09/12/2019  Lower Venous Study Indications: Stroke.  Risk Factors: DM. Limitations: Poor ultrasound/tissue interface. Comparison Study: No prior exam. Performing Technologist: Kennedy Bucker ARDMS, RVT  Examination Guidelines: A complete evaluation includes B-mode imaging, spectral Doppler, color Doppler, and power Doppler as needed of all accessible portions of each vessel. Bilateral testing is considered an integral part of a complete examination. Limited examinations for reoccurring indications may be performed as noted.  +---------+---------------+---------+-----------+----------+--------------+ RIGHT    CompressibilityPhasicitySpontaneityPropertiesThrombus Aging +---------+---------------+---------+-----------+----------+--------------+ CFV      Full           Yes      Yes                                 +---------+---------------+---------+-----------+----------+--------------+ SFJ      Full                                                        +---------+---------------+---------+-----------+----------+--------------+ FV Prox  Full                                                        +---------+---------------+---------+-----------+----------+--------------+ FV Mid   Full                                                        +---------+---------------+---------+-----------+----------+--------------+ FV DistalFull                                                        +---------+---------------+---------+-----------+----------+--------------+ PFV      Full                                                        +---------+---------------+---------+-----------+----------+--------------+  POP      Full           Yes      Yes                                 +---------+---------------+---------+-----------+----------+--------------+ PTV      Full                                                         +---------+---------------+---------+-----------+----------+--------------+ PERO     Full                                                        +---------+---------------+---------+-----------+----------+--------------+ Hypoechoic, oval structure with echogenic center seen in right groin measuring 3.8 x 1.7 x 0.8 cm.  +---------+---------------+---------+-----------+----------+--------------+ LEFT     CompressibilityPhasicitySpontaneityPropertiesThrombus Aging +---------+---------------+---------+-----------+----------+--------------+ CFV      Full           Yes      Yes                                 +---------+---------------+---------+-----------+----------+--------------+ SFJ      Full                                                        +---------+---------------+---------+-----------+----------+--------------+ FV Prox  Full                                                        +---------+---------------+---------+-----------+----------+--------------+ FV Mid   Full                                                        +---------+---------------+---------+-----------+----------+--------------+ FV DistalFull                                                        +---------+---------------+---------+-----------+----------+--------------+ PFV      Full                                                        +---------+---------------+---------+-----------+----------+--------------+ POP      Full           Yes      Yes                                 +---------+---------------+---------+-----------+----------+--------------+  PTV      Full                                                        +---------+---------------+---------+-----------+----------+--------------+ PERO     Full                                                        +---------+---------------+---------+-----------+----------+--------------+ Poorly visualized  bilteral calf veins due to tissue properties.    Summary: Right: There is no evidence of deep vein thrombosis in the lower extremity. No cystic structure found in the popliteal fossa. Hypoechoic, oval structure with echogenic center seen in right groin measuring 3.8 x 1.7 x 0.8 cm. Left: There is no evidence of deep vein thrombosis in the lower extremity. No cystic structure found in the popliteal fossa.  *See table(s) above for measurements and observations. Electronically signed by Fabienne Brunsharles Fields MD on 09/12/2019 at 5:11:30 PM.    Final    US Abdomen Limited RUQ  Result Date: 09/11/2019 CLINICAL DATA:  Transaminitis. EXAM: ULTRASOUND ABDOMEN LIMITED RIGHT UPPER QUADRANT COMPARISON:  None. FINDINGS: Gallbladder: Single mobile 1.6 cm gallstone is present. No evidence of gallbladder wall thickening or pericholecystic fluid. Negative sonographic Murphy sign. Common bile duct: Diameter: 3.4 mm. Liver: Mild increased echogenicity without focal mass. Portal vein is patent on color Doppler imaging with normal direction of blood flow towards the liver. Other: None. IMPRESSION: 1. Single 1.6 cm mobile gallstone. No additional findings to suggest acute cholecystitis. 2.  Suggestion of mild hepatic steatosis.  No focal mass. Electronically Signed   By: Elberta Fortisaniel  Boyle M.D.   On: 09/11/2019 09:16        Scheduled Meds: . aspirin EC  81 mg Oral Daily  . atorvastatin  40 mg Oral q1800  . clopidogrel  75 mg Oral Daily  . enoxaparin (LOVENOX) injection  40 mg Subcutaneous Q24H  . ferrous sulfate  325 mg Oral BID WC  . insulin aspart  0-15 Units Subcutaneous TID WC  . levothyroxine  175 mcg Oral Daily  . linagliptin  5 mg Oral Daily  . metFORMIN  1,000 mg Oral BID WC  . sodium chloride flush  3 mL Intravenous Q12H   Continuous Infusions: . sodium chloride       LOS: 1 day    Alwyn RenElizabeth G Nicollette Wilhelmi, MD [facility] If 7PM-7AM, please contact night-coverage [email or web address] Password [email or web address] 09/12/2019, 8:30  PM

## 2019-09-12 NOTE — Progress Notes (Signed)
Bilateral lower extremity venous duplex exam completed.  Preliminary results can be found under CV proc under chart review.  09/12/2019 11:49 AM  Katherin Ramey, K., RDMS, RVT

## 2019-09-13 LAB — GLUCOSE, CAPILLARY: Glucose-Capillary: 175 mg/dL — ABNORMAL HIGH (ref 70–99)

## 2019-09-13 MED ORDER — SACCHAROMYCES BOULARDII 250 MG PO CAPS
250.0000 mg | ORAL_CAPSULE | Freq: Two times a day (BID) | ORAL | Status: DC
  Administered 2019-09-13 (×2): 250 mg via ORAL
  Filled 2019-09-13 (×2): qty 1

## 2019-09-13 MED ORDER — CLOPIDOGREL BISULFATE 75 MG PO TABS: 75.0000 mg | ORAL_TABLET | Freq: Every day | ORAL | 3 refills | Status: DC

## 2019-09-13 MED ORDER — ASPIRIN 81 MG PO TBEC: 81.0000 mg | DELAYED_RELEASE_TABLET | Freq: Every day | ORAL | Status: DC

## 2019-09-13 NOTE — TOC Transition Note (Signed)
Transition of Care [facility]) - CM/SW Discharge Note   Patient Details  Name: Amy Raymond MRN: [id number] Date of Birth: [birth year]  [facility] CM/SW Contact:  Kermit Balo, RN Phone Number: 09/13/2019, 10:14 AM   Clinical Narrative:    Pt is discharging home with self care. No f/u per PT/OT and no DME needs.  Pt has hospital f/u, insurance and transportation home.   Final next level of care: Home/Self Care Barriers to Discharge: No Barriers Identified   Patient Goals and CMS Choice        Discharge Placement                       Discharge Plan and Services                                     Social Determinants of Health (SDOH) Interventions     Readmission Risk Interventions No flowsheet data found.

## 2019-09-13 NOTE — Progress Notes (Signed)
Pt given discharge summary and discharged home via son as transportation.  

## 2019-10-03 ENCOUNTER — Emergency Department (HOSPITAL_COMMUNITY): Payer: BLUE CROSS/BLUE SHIELD

## 2019-10-03 ENCOUNTER — Inpatient Hospital Stay (HOSPITAL_COMMUNITY)
Admission: EM | Admit: 2019-10-03 | Discharge: 2019-10-06 | DRG: 065 | Disposition: A | Discharge status: Rehabilitation Facility | Payer: BLUE CROSS/BLUE SHIELD | Attending: Internal Medicine | Admitting: Internal Medicine

## 2019-10-03 ENCOUNTER — Encounter (HOSPITAL_COMMUNITY): Payer: Self-pay | Admitting: Radiology

## 2019-10-03 ENCOUNTER — Other Ambulatory Visit: Payer: Self-pay

## 2019-10-03 DIAGNOSIS — R296 Repeated falls: Secondary | ICD-10-CM | POA: Diagnosis present

## 2019-10-03 DIAGNOSIS — R4781 Slurred speech: Secondary | ICD-10-CM | POA: Diagnosis present

## 2019-10-03 DIAGNOSIS — I651 Occlusion and stenosis of basilar artery: Secondary | ICD-10-CM | POA: Diagnosis present

## 2019-10-03 DIAGNOSIS — I639 Cerebral infarction, unspecified: Secondary | ICD-10-CM | POA: Diagnosis not present

## 2019-10-03 DIAGNOSIS — I6322 Cerebral infarction due to unspecified occlusion or stenosis of basilar arteries: Secondary | ICD-10-CM | POA: Diagnosis not present

## 2019-10-03 DIAGNOSIS — R29810 Facial weakness: Secondary | ICD-10-CM | POA: Diagnosis present

## 2019-10-03 DIAGNOSIS — E039 Hypothyroidism, unspecified: Secondary | ICD-10-CM | POA: Diagnosis present

## 2019-10-03 DIAGNOSIS — I10 Essential (primary) hypertension: Secondary | ICD-10-CM | POA: Diagnosis present

## 2019-10-03 DIAGNOSIS — R29702 NIHSS score 2: Secondary | ICD-10-CM | POA: Diagnosis present

## 2019-10-03 DIAGNOSIS — I951 Orthostatic hypotension: Secondary | ICD-10-CM | POA: Diagnosis present

## 2019-10-03 DIAGNOSIS — I6381 Other cerebral infarction due to occlusion or stenosis of small artery: Secondary | ICD-10-CM | POA: Diagnosis present

## 2019-10-03 DIAGNOSIS — E785 Hyperlipidemia, unspecified: Secondary | ICD-10-CM | POA: Diagnosis present

## 2019-10-03 DIAGNOSIS — E1151 Type 2 diabetes mellitus with diabetic peripheral angiopathy without gangrene: Secondary | ICD-10-CM | POA: Diagnosis present

## 2019-10-03 DIAGNOSIS — Z7984 Long term (current) use of oral hypoglycemic drugs: Secondary | ICD-10-CM | POA: Diagnosis not present

## 2019-10-03 DIAGNOSIS — Z7902 Long term (current) use of antithrombotics/antiplatelets: Secondary | ICD-10-CM | POA: Diagnosis not present

## 2019-10-03 DIAGNOSIS — R130 Aphagia: Secondary | ICD-10-CM | POA: Diagnosis not present

## 2019-10-03 DIAGNOSIS — E876 Hypokalemia: Secondary | ICD-10-CM | POA: Diagnosis not present

## 2019-10-03 DIAGNOSIS — Z8673 Personal history of transient ischemic attack (TIA), and cerebral infarction without residual deficits: Secondary | ICD-10-CM | POA: Diagnosis not present

## 2019-10-03 DIAGNOSIS — Z79899 Other long term (current) drug therapy: Secondary | ICD-10-CM | POA: Diagnosis not present

## 2019-10-03 DIAGNOSIS — K5900 Constipation, unspecified: Secondary | ICD-10-CM | POA: Diagnosis not present

## 2019-10-03 DIAGNOSIS — R4701 Aphasia: Secondary | ICD-10-CM | POA: Diagnosis present

## 2019-10-03 DIAGNOSIS — Z79890 Hormone replacement therapy (postmenopausal): Secondary | ICD-10-CM | POA: Diagnosis not present

## 2019-10-03 DIAGNOSIS — I6350 Cerebral infarction due to unspecified occlusion or stenosis of unspecified cerebral artery: Secondary | ICD-10-CM | POA: Diagnosis not present

## 2019-10-03 DIAGNOSIS — E119 Type 2 diabetes mellitus without complications: Secondary | ICD-10-CM

## 2019-10-03 DIAGNOSIS — R29706 NIHSS score 6: Secondary | ICD-10-CM | POA: Diagnosis not present

## 2019-10-03 DIAGNOSIS — D509 Iron deficiency anemia, unspecified: Secondary | ICD-10-CM | POA: Diagnosis present

## 2019-10-03 DIAGNOSIS — Z20822 Contact with and (suspected) exposure to covid-19: Secondary | ICD-10-CM | POA: Diagnosis present

## 2019-10-03 DIAGNOSIS — E1165 Type 2 diabetes mellitus with hyperglycemia: Secondary | ICD-10-CM | POA: Diagnosis present

## 2019-10-03 DIAGNOSIS — I69351 Hemiplegia and hemiparesis following cerebral infarction affecting right dominant side: Secondary | ICD-10-CM | POA: Diagnosis not present

## 2019-10-03 DIAGNOSIS — I6319 Cerebral infarction due to embolism of other precerebral artery: Secondary | ICD-10-CM

## 2019-10-03 DIAGNOSIS — Z7982 Long term (current) use of aspirin: Secondary | ICD-10-CM | POA: Diagnosis not present

## 2019-10-03 DIAGNOSIS — I6523 Occlusion and stenosis of bilateral carotid arteries: Secondary | ICD-10-CM | POA: Diagnosis present

## 2019-10-03 HISTORY — DX: Hyperlipidemia, unspecified: E78.5

## 2019-10-03 HISTORY — DX: Hypothyroidism, unspecified: E03.9

## 2019-10-03 HISTORY — DX: Essential (primary) hypertension: I10

## 2019-10-03 HISTORY — DX: Cerebral infarction, unspecified: I63.9

## 2019-10-03 LAB — SARS CORONAVIRUS 2 (TAT 6-24 HRS): SARS Coronavirus 2: NEGATIVE

## 2019-10-03 LAB — URINALYSIS, ROUTINE W REFLEX MICROSCOPIC
Bilirubin Urine: NEGATIVE
Glucose, UA: NEGATIVE mg/dL
Hgb urine dipstick: NEGATIVE
Ketones, ur: 15 mg/dL — AB
Leukocytes,Ua: NEGATIVE
Nitrite: NEGATIVE
Protein, ur: NEGATIVE mg/dL
Specific Gravity, Urine: <1.005 — ABNORMAL LOW (ref 1.005–1.030)
pH: 5.5 (ref 5.0–8.0)

## 2019-10-03 LAB — COMPREHENSIVE METABOLIC PANEL
ALT: 21 U/L (ref 0–44)
AST: 23 U/L (ref 15–41)
Albumin: 3.1 g/dL — ABNORMAL LOW (ref 3.5–5.0)
Alkaline Phosphatase: 137 U/L — ABNORMAL HIGH (ref 38–126)
Anion gap: 15 (ref 5–15)
BUN: 9 mg/dL (ref 6–20)
CO2: 24 mmol/L (ref 22–32)
Calcium: 9.3 mg/dL (ref 8.9–10.3)
Chloride: 101 mmol/L (ref 98–111)
Creatinine, Ser: 0.79 mg/dL (ref 0.44–1.00)
GFR calc Af Amer: >60 mL/min (ref >60)
GFR calc non Af Amer: >60 mL/min (ref >60)
Glucose, Bld: 180 mg/dL — ABNORMAL HIGH (ref 70–99)
Potassium: 3.3 mmol/L — ABNORMAL LOW (ref 3.5–5.1)
Sodium: 140 mmol/L (ref 135–145)
Total Bilirubin: 0.6 mg/dL (ref 0.3–1.2)
Total Protein: 7.5 g/dL (ref 6.5–8.1)

## 2019-10-03 LAB — GLUCOSE, CAPILLARY
Glucose-Capillary: 151 mg/dL — ABNORMAL HIGH (ref 70–99)
Glucose-Capillary: 146 mg/dL — ABNORMAL HIGH (ref 70–99)

## 2019-10-03 LAB — CBC
HCT: 32.2 % — ABNORMAL LOW (ref 36.0–46.0)
Hemoglobin: 10.1 g/dL — ABNORMAL LOW (ref 12.0–15.0)
MCH: 24 pg — ABNORMAL LOW (ref 26.0–34.0)
MCHC: 31.4 g/dL (ref 30.0–36.0)
MCV: 76.7 fL — ABNORMAL LOW (ref 80.0–100.0)
Platelets: 524 10*3/uL — ABNORMAL HIGH (ref 150–400)
RBC: 4.2 MIL/uL (ref 3.87–5.11)
RDW: 13.5 % (ref 11.5–15.5)
WBC: 8.4 10*3/uL (ref 4.0–10.5)
nRBC: 0 % (ref 0.0–0.2)

## 2019-10-03 LAB — RAPID URINE DRUG SCREEN, HOSP PERFORMED
Amphetamines: NOT DETECTED
Barbiturates: NOT DETECTED
Benzodiazepines: NOT DETECTED
Cocaine: NOT DETECTED
Opiates: NOT DETECTED
Tetrahydrocannabinol: NOT DETECTED

## 2019-10-03 LAB — PROTIME-INR
INR: 1.1 (ref 0.8–1.2)
Prothrombin Time: 14.2 seconds (ref 11.4–15.2)

## 2019-10-03 LAB — CBG MONITORING, ED
Glucose-Capillary: 165 mg/dL — ABNORMAL HIGH (ref 70–99)
Glucose-Capillary: 94 mg/dL (ref 70–99)

## 2019-10-03 LAB — APTT: aPTT: 31 seconds (ref 24–36)

## 2019-10-03 LAB — ETHANOL: Alcohol, Ethyl (B): <10 mg/dL (ref <10)

## 2019-10-03 MED ORDER — SODIUM CHLORIDE 0.9 % IV SOLN: INTRAVENOUS | Status: DC

## 2019-10-03 MED ORDER — IOHEXOL 350 MG/ML SOLN
75.0000 mL | Freq: Once | INTRAVENOUS | Status: AC | PRN
  Administered 2019-10-03: 75 mL via INTRAVENOUS

## 2019-10-03 MED ORDER — LEVOTHYROXINE SODIUM 75 MCG PO TABS
175.0000 ug | ORAL_TABLET | Freq: Every day | ORAL | Status: DC
  Administered 2019-10-05: 175 ug via ORAL
  Filled 2019-10-03: qty 1

## 2019-10-03 MED ORDER — STROKE: EARLY STAGES OF RECOVERY BOOK
Freq: Once | Status: AC
  Filled 2019-10-03 (×2): qty 1

## 2019-10-03 MED ORDER — SENNOSIDES-DOCUSATE SODIUM 8.6-50 MG PO TABS: 1.0000 | ORAL_TABLET | Freq: Every evening | ORAL | Status: DC | PRN

## 2019-10-03 MED ORDER — ENOXAPARIN SODIUM 40 MG/0.4ML ~~LOC~~ SOLN
40.0000 mg | SUBCUTANEOUS | Status: DC
  Administered 2019-10-03: 40 mg via SUBCUTANEOUS
  Filled 2019-10-03: qty 0.4

## 2019-10-03 MED ORDER — INSULIN ASPART 100 UNIT/ML ~~LOC~~ SOLN
0.0000 [IU] | Freq: Three times a day (TID) | SUBCUTANEOUS | Status: DC
  Administered 2019-10-03: 3 [IU] via SUBCUTANEOUS
  Administered 2019-10-05 (×2): 0 [IU] via SUBCUTANEOUS
  Administered 2019-10-05: 2 [IU] via SUBCUTANEOUS

## 2019-10-03 MED ORDER — ATORVASTATIN CALCIUM 40 MG PO TABS
40.0000 mg | ORAL_TABLET | Freq: Every day | ORAL | Status: DC
  Administered 2019-10-03: 40 mg via ORAL
  Filled 2019-10-03: qty 1

## 2019-10-03 MED ORDER — SODIUM CHLORIDE 0.9 % IV BOLUS
1000.0000 mL | Freq: Once | INTRAVENOUS | Status: AC
  Administered 2019-10-03: 1000 mL via INTRAVENOUS

## 2019-10-03 MED ORDER — CLOPIDOGREL BISULFATE 75 MG PO TABS
75.0000 mg | ORAL_TABLET | Freq: Every day | ORAL | Status: DC
  Administered 2019-10-03 – 2019-10-06 (×4): 75 mg via ORAL
  Filled 2019-10-03 (×4): qty 1

## 2019-10-03 MED ORDER — ATORVASTATIN CALCIUM 80 MG PO TABS
80.0000 mg | ORAL_TABLET | Freq: Every day | ORAL | Status: DC
  Administered 2019-10-04 – 2019-10-05 (×2): 80 mg via ORAL
  Filled 2019-10-03 (×2): qty 1

## 2019-10-03 MED ORDER — ASPIRIN EC 81 MG PO TBEC
81.0000 mg | DELAYED_RELEASE_TABLET | Freq: Every day | ORAL | Status: DC
  Administered 2019-10-03 – 2019-10-04 (×2): 81 mg via ORAL
  Filled 2019-10-03 (×2): qty 1

## 2019-10-03 NOTE — Plan of Care (Signed)
  Problem: Education: Goal: Knowledge of disease or condition will improve Outcome: Progressing Goal: Knowledge of secondary prevention will improve Outcome: Progressing Goal: Knowledge of patient specific risk factors addressed and post discharge goals established will improve Outcome: Progressing Goal: Individualized Educational Video(s) Outcome: Progressing   Problem: Coping: Goal: Will verbalize positive feelings about self Outcome: Progressing   Problem: Health Behavior/Discharge Planning: Goal: Ability to manage health-related needs will improve Outcome: Progressing   Problem: Self-Care: Goal: Ability to participate in self-care as condition permits will improve Outcome: Progressing Goal: Ability to communicate needs accurately will improve Outcome: Progressing   Problem: Nutrition: Goal: Risk of aspiration will decrease Outcome: Progressing Goal: Dietary intake will improve Outcome: Progressing   Problem: Intracerebral Hemorrhage Tissue Perfusion: Goal: Complications of Intracerebral Hemorrhage will be minimized Outcome: Progressing   Problem: Ischemic Stroke/TIA Tissue Perfusion: Goal: Complications of ischemic stroke/TIA will be minimized Outcome: Progressing   Problem: Spontaneous Subarachnoid Hemorrhage Tissue Perfusion: Goal: Complications of Spontaneous Subarachnoid Hemorrhage will be minimized Outcome: Progressing

## 2019-10-03 NOTE — ED Triage Notes (Signed)
Pt came in [facility] c/o of weakness and fall. She was seen here a month ago and was said to have "3 TIAs". Pt is concerned she is having more TIAs.

## 2019-10-03 NOTE — Consult Note (Signed)
Neurology Consultation  Reason for Consult: Increasing falls, pontine stroke Referring Physician: Dr. Deno Etienne  CC: Recent stroke, increasing falls, new pontine stroke on MRI  History is obtained from: Patient, chart  HPI: Amy Raymond is a 54 y.o. female past medical history of diabetes, thyroid disease, migraines presented to the emergency room for evaluation of increasing falls. She was recently seen here end of January for scattered embolic-looking strokes with suspicion for a cardioembolic event with work-up remaining unrevealing. She was discharged home and says that over the past 2 days has noticed that she has having falls.  She feels discoordinated and unable to maintain a normal gait. Due to her ongoing symptoms, an MRI of the brain was obtained that showed a large pontine stroke, which is new from the MRI from 09/11/2019. Neurological consultation was obtained for further recommendations.  Chart review, she underwent transthoracic echo, CT angio head and neck as well as lower extremity Dopplers which were all unrevealing for a source for her stroke.  Cardiac monitoring did not reveal atrial fibrillation. She was discharged home on dual antiplatelets for 3 weeks followed by Plavix only. Her A1c was elevated at 8.7.  LDL was 117.  She did not require any home PT or OT at the time of last discharge.    LKW: Greater than 48 hours ago tpa given?: no, outside the window Premorbid modified Rankin scale (mRS): 1  ROS: ROS was performed and is negative except as noted in the HPI.    Past Medical History:  Diagnosis Date  . Diabetes mellitus without complication ([employer])   . Thyroid disease    No family history on file.   Social History:   reports that she has never smoked. She has never used smokeless tobacco. She reports that she does not drink alcohol or use drugs.  Medications  Current Facility-Administered Medications:  .  sodium chloride 0.9 % bolus 1,000 mL, 1,000  mL, Intravenous, Once, Domenic Moras, PA-C  Current Outpatient Medications:  .  aspirin 81 MG EC tablet, Take 1 tablet (81 mg total) by mouth daily. Take aspirin with Plavix for 3 months.  Then stop the aspirin and continue Plavix indefinitely., Disp: 30 tablet, Rfl:  .  atorvastatin (LIPITOR) 40 MG tablet, Take 1 tablet (40 mg total) by mouth daily at 6 PM., Disp: 30 tablet, Rfl: 3 .  cholecalciferol (VITAMIN D3) 25 MCG (1000 UNIT) tablet, Take 1,000 Units by mouth daily., Disp: , Rfl:  .  clopidogrel (PLAVIX) 75 MG tablet, Take 1 tablet (75 mg total) by mouth daily., Disp: 30 tablet, Rfl: 3 .  ferrous sulfate 325 (65 FE) MG tablet, Take 325 mg by mouth daily with breakfast., Disp: , Rfl:  .  glipiZIDE (GLUCOTROL) 10 MG tablet, Take 10 mg by mouth 2 (two) times daily., Disp: , Rfl:  .  levothyroxine (SYNTHROID) 175 MCG tablet, Take 1 tablet (175 mcg total) by mouth daily., Disp: 30 tablet, Rfl: 3 .  Linagliptin-Metformin HCl (JENTADUETO) 2.12-998 MG TABS, Take 1 tablet by mouth 2 (two) times daily.  , Disp: , Rfl:  .  lisinopril (ZESTRIL) 10 MG tablet, Take 10 mg by mouth daily., Disp: , Rfl:  .  ondansetron (ZOFRAN) 4 MG tablet, Take 1 tablet (4 mg total) by mouth every 6 (six) hours as needed for nausea., Disp: 20 tablet, Rfl: 0  Exam: Current vital signs: BP (!) 151/63   Pulse 74   Temp 98.6 F (37 C) (Oral)   Resp Marland Kitchen)  22   Ht 5\' 4"  (1.626 m)   Wt 77 kg   SpO2 100%   BMI 29.14 kg/m  Vital signs in last 24 hours: Temp:  [98.6 F (37 C)] 98.6 F (37 C) (02/22 0451) Pulse Rate:  [74-92] 74 (02/22 0530) Resp:  [14-23] 22 (02/22 0645) BP: (110-151)/(56-79) 151/63 (02/22 0645) SpO2:  [97 %-100 %] 100 % (02/22 0530) Weight:  [77 kg] 77 kg (02/22 0435)  GENERAL: Awake, alert in NAD HEENT: - Normocephalic and atraumatic, dry mm, no LN++, no Thyromegally LUNGS - Clear to auscultation bilaterally with no wheezes CV - S1S2 RRR, no m/r/g, equal pulses bilaterally. ABDOMEN - Soft,  nontender, nondistended with normoactive BS Ext: warm, well perfused, intact peripheral pulses, no edema  NEURO:  Mental Status: AA&Ox3  Language: speech is clear.  Naming, repetition, fluency, and comprehension intact. Cranial Nerves: PERRL. EOMI, visual fields full, no facial asymmetry, facial sensation intact, hearing intact, tongue/uvula/soft palate midline, normal sternocleidomastoid and trapezius muscle strength.  Question mild right tongue deviation. Motor: Bilateral upper extremity 5/5 with no drift.  Bilateral lower extremity 5/5 with no drift. Tone: is normal and bulk is normal Sensation- Intact to light touch bilaterally Coordination ataxic on the right upper and lower extremity more than left. Gait- deferred  NIHSS-2 for ataxia   Labs I have reviewed labs in epic and the results pertinent to this consultation are:  CBC    Component Value Date/Time   WBC 8.4 10/03/2019 0445   RBC 4.20 10/03/2019 0445   HGB 10.1 (L) 10/03/2019 0445   HCT 32.2 (L) 10/03/2019 0445   PLT 524 (H) 10/03/2019 0445   MCV 76.7 (L) 10/03/2019 0445   MCH 24.0 (L) 10/03/2019 0445   MCHC 31.4 10/03/2019 0445   RDW 13.5 10/03/2019 0445   LYMPHSABS 1.7 09/10/2019 2150   MONOABS 0.8 09/10/2019 2150   EOSABS 0.1 09/10/2019 2150   BASOSABS 0.0 09/10/2019 2150   CMP     Component Value Date/Time   NA 140 10/03/2019 0445   K 3.3 (L) 10/03/2019 0445   CL 101 10/03/2019 0445   CO2 24 10/03/2019 0445   GLUCOSE 180 (H) 10/03/2019 0445   BUN 9 10/03/2019 0445   CREATININE 0.79 10/03/2019 0445   CALCIUM 9.3 10/03/2019 0445   PROT 7.5 10/03/2019 0445   ALBUMIN 3.1 (L) 10/03/2019 0445   AST 23 10/03/2019 0445   ALT 21 10/03/2019 0445   ALKPHOS 137 (H) 10/03/2019 0445   BILITOT 0.6 10/03/2019 0445   GFRNONAA >60 10/03/2019 0445   GFRAA >60 10/03/2019 0445   Imaging I have reviewed the images obtained:  CT head and CT angio head and neck with low-density in the ventral pons new from January  2021.  Severe left cavernous ICA stenosis and 60% narrowing of the right cavernous ICA.  MRI brain shows acute infarct in the ventral pons located within and to the left of the midline.  Punctate foci of diffusion abnormality in the posterior cerebral hemispheres on the prior MRI resolved.  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 Chronic lacunar infarct noted in the left thalamus. No bleed.  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 Likely etiology small vessel disease but she has  severe vascular stenoses and unknown etiology of her stroke. She would benefit from further cardiac monitoring as well as possible TCD with bubble study  Impression: Acute ischemic stroke affecting the pons. New from the prior strokes that affected bilateral cerebral hemispheres and were likely embolic versus concurrent small vessel disease.  Recommendations: Frequent neurochecks Telemetry TCD with bubbles Consider transesophageal echocardiogram No need to repeat transthoracic echo, A1c or LDL. Continue dual antiplatelets for now Continue statin PT OT speech therapy Stroke team will follow with you.  -- Milon Dikes, MD [facility] Pager: [phone] If 7pm to 7am, please call on call as listed on AMION.

## 2019-10-03 NOTE — Progress Notes (Signed)
Oriented pt to room and unit. NAD. Pt able to ambulate to bathroom with standby assist. Pt is stable. VSS. Tele SR. Stroke Scale score 0. Pt is mildly weaker or right side with some mild dysarthia. Pt able to swallow safely and without difficulties. Will continue to monitor.

## 2019-10-03 NOTE — Progress Notes (Signed)
    [facility] has been requested to perform a transesophageal echocardiogram on Ms. Kroeger for stroke.  After careful review of history and examination, the risks and benefits of transesophageal echocardiogram have been explained including risks of esophageal damage, perforation (1:10,000 risk), bleeding, pharyngeal hematoma as well as other potential complications associated with conscious sedation including aspiration, arrhythmia, respiratory failure and death. Alternatives to treatment were discussed, questions were answered. Patient is willing to proceed.  TEE - Dr. Duke Salvia @ 10/04/19 @ 1400 . NPO after midnight. Meds with sips.   Manson Passey, PA-C 10/03/2019 5:08 PM

## 2019-10-03 NOTE — Evaluation (Signed)
Speech Language Pathology Evaluation Patient Details Name: Amy Raymond MRN: [id number] DOB: 1966/07/25 Today's Date: 10/03/2019 Time: 3329-5188 SLP Time Calculation (min) (ACUTE ONLY): 21 min  Problem List:  Patient Active Problem List   Diagnosis Date Noted  . 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 Hypothyroidism (acquired)   . Hypertension   . Dyslipidemia   . Diabetes mellitus without complication (HCC)   . Intractable headache 09/11/2019  . Hypothyroidism 09/11/2019  . Type 2 diabetes mellitus without complication (HCC) 09/11/2019  . CVA (cerebral vascular accident) (HCC) 09/11/2019  . Elevated liver enzymes 09/11/2019   Past Medical History:  Past Medical History:  Diagnosis Date  . CVA (cerebral vascular accident) (HCC)    09/11/19; 10/03/19  . Diabetes mellitus without complication (HCC)   . Dyslipidemia   . Hypertension   . Hypothyroidism (acquired)    Past Surgical History:  Past Surgical History:  Procedure Laterality Date  . TUBAL LIGATION     HPI:  Amy Raymond is a 54 y.o. female past medical history of diabetes, thyroid disease, migraines presented to ED for increasing falls.  She was recently seen here end of January for scattered embolic-looking strokes with suspicion for a cardioembolic event with work-up remaining unrevealing.  MRI of the brain was obtained 2/22 that showed a large pontine stroke, which is new from the MRI from 09/11/2019.   Assessment / Plan / Recommendation Clinical Impression   Patient encountered in bed for cognitive-linguistic/speech/language assessment. Patient presents with minimal-mild dysarthria in the setting of pontine stroke. Her expressive and receptive language skills are [facility]. She does not present with any cognitive-linguistic impairments.  Patient's speech is characterized by mild consonant distortions, reduced rate of speech that result in minimally reduced intelligibility at the  conversation level.  Patient able to answer basic and complex yes/no questions, follow multi-step verbal commands, engage in automatic speech tasks without difficulty. She was able to name 17 items in one minute in generative naming task, able to name 15/15 items in confrontation naming task and denies word-finding difficulty. Portions of the COGNISTAT administered to assist with this evaluation, all scores obtained are [facility]. See below for scores details.  Patient will benefit from brief ST follow up to address dysarthria, instruct in compensatory strategies and may benefit from continued ST services at the next venue of care.   Results and recommendations discussed with patient, patient states she would like to participate in ST while in-house.   COGNISTAT, all scores within functional limits unless otherwise specified Orientation: not given Attention: 8/8 Comprehension: not given Repetition: 10/12 Naming: 7/8 Construction: not given Memory: 12/12 Calculations: 3/4 Reasoning similarities: 6/8 Reasoning judgment: 5/6    SLP Assessment  SLP Recommendation/Assessment: Patient needs continued Speech Lanaguage Pathology Services SLP Visit Diagnosis: Dysarthria and anarthria (R47.1)    Follow Up Recommendations  None;Outpatient SLP    Frequency and Duration min 1 x/week  2 weeks      SLP Evaluation Cognition  Overall Cognitive Status: Within Functional Limits for tasks assessed Arousal/Alertness: Awake/alert Orientation Level: Oriented X4 Attention: Focused;Sustained Focused Attention: Appears intact Sustained Attention: Appears intact Memory: Appears intact Problem Solving: Appears intact Safety/Judgment: Appears intact       Comprehension  Auditory Comprehension Overall Auditory Comprehension: Appears within functional limits for tasks assessed Visual Recognition/Discrimination Discrimination: Not tested Reading Comprehension Reading Status: Not tested    Expression  Expression Primary Mode of Expression: Verbal Verbal Expression Overall Verbal Expression: Appears within functional limits for tasks  assessed Written Expression Dominant Hand: Right Written Expression: Not tested   Oral / Motor  Oral Motor/Sensory Function Overall Oral Motor/Sensory Function: Within functional limits Lingual Symmetry: Abnormal symmetry right(deviation to the right) Motor Speech Overall Motor Speech: Impaired Respiration: Within functional limits Phonation: Normal Resonance: Within functional limits Articulation: Impaired Level of Impairment: Conversation Intelligibility: Intelligibility reduced Word: 75-100% accurate Phrase: 75-100% accurate Sentence: 75-100% accurate Conversation: 75-100% accurate Motor Planning: Witnin functional limits Effective Techniques: Slow rate;Over-articulate;Pacing   GO                    Marina Goodell 10/03/2019, 4:33 PM  Marina Goodell, M.Ed., [facility] Acute Rehabilitation [phone]: Acute Rehab office [phone] - pager

## 2019-10-03 NOTE — ED Notes (Signed)
Bonita Quin coleman-[phone]- would like pt updates.

## 2019-10-03 NOTE — ED Notes (Signed)
Pt ambulatory to BR with standby assist without acute dizziness or lightheadedness.  Alert and calm.

## 2019-10-03 NOTE — H&P (Signed)
History and Physical    NANETTA WIEGMAN ASN:[id number] DOB: February 24, 1966 DOA: 10/03/2019  PCP: [place], Julie M, NP Consultants:  Shawnee Knapp - endocrinology  Patient coming from:  Home - lives alone; NOK:  Mother, Elmarie Mainland, [phone]  Chief Complaint: weakness, falls  HPI: Amy Raymond is a 54 y.o. female with medical history significant of DM; HTN; HLD; and CVA 1/30 presenting with weakness, falls.  She reports that she fell yesterday.  She isn't able to get her balance right, seems to shift side to side with walking.  She was last admitted from 1/30-2/1 for an acute CVA.  She was discharged on ASA and Plavix.  Previously, she had headache and vision loss.  At the time of d/c, she was about 60% - so weak and light-headed.  She thought it might get better but it stayed the same.  She wasn't able to go anywhere because she was too weak.  The fall was the only thing that was clearly different yesterday.  She has not missed a dose of ASA/Plavix.   ED Course:  Acute pontine CVA by MRI.  Previously admitted 3 weeks ago with small infarcts, placed on DAPT.  Dr. Wilford Corner reports she should come back in for further work-up, likely TEE, TCD with bubbles.  Review of Systems: As per HPI; otherwise review of systems reviewed and negative.   Ambulatory Status:  Ambulates without assistance  Past Medical History:  Diagnosis Date  . CVA (cerebral vascular accident) (HCC)    09/11/19; 10/03/19  . Diabetes mellitus without complication (HCC)   . Dyslipidemia   . Hypertension   . Hypothyroidism (acquired)     Past Surgical History:  Procedure Laterality Date  . TUBAL LIGATION      Social History   Socioeconomic History  . Marital status: Single    Spouse name: Not on file  . Number of children: Not on file  . Years of education: Not on file  . Highest education level: Not on file  Occupational History  . Occupation: retired  Tobacco Use  . Smoking status: Never Smoker  . Smokeless tobacco:  Never Used  Substance and Sexual Activity  . Alcohol use: No  . Drug use: No  . Sexual activity: Not on file  Other Topics Concern  . Not on file  Social History Narrative  . Not on file   Social Determinants of Health   Financial Resource Strain:   . Difficulty of Paying Living Expenses: Not on file  Food Insecurity:   . Worried About Programme researcher, broadcasting/film/video in the Last Year: Not on file  . Ran Out of Food in the Last Year: Not on file  Transportation Needs:   . Lack of Transportation (Medical): Not on file  . Lack of Transportation (Non-Medical): Not on file  Physical Activity:   . Days of Exercise per Week: Not on file  . Minutes of Exercise per Session: Not on file  Stress:   . Feeling of Stress : Not on file  Social Connections:   . Frequency of Communication with Friends and Family: Not on file  . Frequency of Social Gatherings with Friends and Family: Not on file  . Attends Religious Services: Not on file  . Active Member of Clubs or Organizations: Not on file  . Attends Banker Meetings: Not on file  . Marital Status: Not on file  Intimate Partner Violence:   . Fear of Current or Ex-Partner: Not on file  .  Emotionally Abused: Not on file  . Physically Abused: Not on file  . Sexually Abused: Not on file    No Known Allergies  Family History  Problem Relation Age of Onset  . Asthma Mother   . Thrombocytopenia Mother   . Dementia Father   . Stroke Neg Hx     Prior to Admission medications   Medication Sig Start Date End Date Taking? Authorizing Provider  aspirin 81 MG EC tablet Take 1 tablet (81 mg total) by mouth daily. Take aspirin with Plavix for 3 months.  Then stop the aspirin and continue Plavix indefinitely. 09/13/19  Yes Alwyn Ren, MD  atorvastatin (LIPITOR) 40 MG tablet Take 1 tablet (40 mg total) by mouth daily at 6 PM. 09/12/19  Yes Alwyn Ren, MD  cholecalciferol (VITAMIN D3) 25 MCG (1000 UNIT) tablet Take 1,000 Units by  mouth daily.   Yes [provider]  clopidogrel (PLAVIX) 75 MG tablet Take 1 tablet (75 mg total) by mouth daily. 09/13/19  Yes Alwyn Ren, MD  ferrous sulfate 325 (65 FE) MG tablet Take 325 mg by mouth daily with breakfast.   Yes [provider]  glipiZIDE (GLUCOTROL) 10 MG tablet Take 10 mg by mouth 2 (two) times daily. 08/22/19  Yes [provider]  levothyroxine (SYNTHROID) 175 MCG tablet Take 1 tablet (175 mcg total) by mouth daily. 09/13/19  Yes Alwyn Ren, MD  Linagliptin-Metformin HCl (JENTADUETO) 2.12-998 MG TABS Take 1 tablet by mouth 2 (two) times daily.     Yes [provider]  lisinopril (ZESTRIL) 10 MG tablet Take 10 mg by mouth daily. 09/28/19  Yes [provider]  ondansetron (ZOFRAN) 4 MG tablet Take 1 tablet (4 mg total) by mouth every 6 (six) hours as needed for nausea. 09/12/19  Yes Alwyn Ren, MD    Physical Exam: Vitals:   10/03/19 1418 10/03/19 1500 10/03/19 1543 10/03/19 1654  BP: (!) 143/86 127/60 (!) 142/55 (!) 143/52  Pulse: 83 81 80 73  Resp: 18 17 20    Temp:      TempSrc:      SpO2: 100% 99% 100%   Weight:      Height:         . General:  Appears calm and comfortable and is NAD . Eyes:  PERRL, EOMI, normal lids, iris . ENT:  grossly normal hearing, lips & tongue, mmm . Neck:  no LAD, masses or thyromegaly; no carotid bruits . Cardiovascular:  RRR, no m/r/g. No LE edema.  Marland Kitchen Respiratory:   CTA bilaterally with no wheezes/rales/rhonchi.  Normal respiratory effort. . Abdomen:  soft, NT, ND, NABS . Skin:  no rash or induration seen on limited exam . Musculoskeletal:  grossly normal tone BUE/BLE, good ROM, no bony abnormality . Lower extremity:  No LE edema.  Limited foot exam with no ulcerations.  2+ distal pulses. Marland Kitchen Psychiatric:  grossly normal mood and affect, speech fluent and appropriate, AOx3 . Neurologic:  CN 2-12 grossly intact, moves all extremities in coordinated fashion, sensation  intact, mild ataxia with subtle finger-to-nose dysmetria    Radiological Exams on Admission: CT Angio Head W or Wo Contrast  Result Date: 10/03/2019 CLINICAL DATA:  Found unresponsive. EXAM: CT ANGIOGRAPHY HEAD AND NECK TECHNIQUE: Multidetector CT imaging of the head and neck was performed using the standard protocol during bolus administration of intravenous contrast. Multiplanar CT image reconstructions and MIPs were obtained to evaluate the vascular anatomy. Carotid stenosis measurements (when applicable)  are obtained utilizing NASCET criteria, using the distal internal carotid diameter as the denominator. CONTRAST:  24mL OMNIPAQUE IOHEXOL 350 MG/ML SOLN COMPARISON:  09/10/2019 FINDINGS: CT HEAD FINDINGS Brain: Low-density in the ventral midline pons, new. No acute hemorrhage, hydrocephalus, or masslike finding. Vascular: See below Skull: Negative Sinuses: Clear Orbits: Negative Review of the MIP images confirms the above findings CTA NECK FINDINGS Aortic arch: 2 vessel arch.  No acute finding Right carotid system: Atheromatous wall thickening of the common carotid and likely of the bulb. No flow limiting stenosis or ulceration. Left carotid system: Low-density atheromatous wall thickening of the common carotid and likely of the bulb. No ulceration flow limiting stenosis Vertebral arteries: No proximal subclavian stenosis. The non dominant left vertebral artery shows thready flow with no meaningful patency at the level of the dura. Right vertebral artery is smoothly contoured and patent, partially obscured by intravenous contrast at the V1 segment. Skeleton: Cervical disc degeneration. Other neck: No acute or aggressive finding. Upper chest: Negative Review of the MIP images confirms the above findings CTA HEAD FINDINGS Anterior circulation: Atherosclerotic plaque at the carotid siphons with left more than right stenosis. At least 75% narrowing is seen at the posterior and anterior genu on the left. On the  right at the posterior genu there is 60% stenosis. No branch occlusion or beading. Negative for aneurysm. Posterior circulation: No meaningful flow in the left vertebral artery, chronic. The right V4 segment is diffusely patent. Chronic moderate narrowing of the basilar distally. No branch occlusion or beading. Negative for aneurysm Venous sinuses: Patent Anatomic variants: Negative Review of the MIP images confirms the above findings IMPRESSION: 1. Low-density in the ventral pons that is new from January 2021. History suggests acute infarct but this would have an atypical central location. Recommend brain MRI 2. No emergent vascular finding. 3. 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 4. Severe left cavernous ICA stenosis. 60% narrowing of the right cavernous ICA. Electronically Signed   By: Marnee Spring M.D.   On: 10/03/2019 06:12   CT Angio Neck W and/or Wo Contrast  Result Date: 10/03/2019 CLINICAL DATA:  Found unresponsive. EXAM: CT ANGIOGRAPHY HEAD AND NECK TECHNIQUE: Multidetector CT imaging of the head and neck was performed using the standard protocol during bolus administration of intravenous contrast. Multiplanar CT image reconstructions and MIPs were obtained to evaluate the vascular anatomy. Carotid stenosis measurements (when applicable) are obtained utilizing NASCET criteria, using the distal internal carotid diameter as the denominator. CONTRAST:  69mL OMNIPAQUE IOHEXOL 350 MG/ML SOLN COMPARISON:  09/10/2019 FINDINGS: CT HEAD FINDINGS Brain: Low-density in the ventral midline pons, new. No acute hemorrhage, hydrocephalus, or masslike finding. Vascular: See below Skull: Negative Sinuses: Clear Orbits: Negative Review of the MIP images confirms the above findings CTA NECK FINDINGS Aortic arch: 2 vessel arch.  No acute finding Right carotid system: Atheromatous wall thickening of the common carotid and likely of the bulb. No flow limiting  stenosis or ulceration. Left carotid system: Low-density atheromatous wall thickening of the common carotid and likely of the bulb. No ulceration flow limiting stenosis Vertebral arteries: No proximal subclavian stenosis. The non dominant left vertebral artery shows thready flow with no meaningful patency at the level of the dura. Right vertebral artery is smoothly contoured and patent, partially obscured by intravenous contrast at the V1 segment. Skeleton: Cervical disc degeneration. Other neck: No acute or aggressive finding. Upper chest: Negative Review of the MIP images confirms the above  findings CTA HEAD FINDINGS Anterior circulation: Atherosclerotic plaque at the carotid siphons with left more than right stenosis. At least 75% narrowing is seen at the posterior and anterior genu on the left. On the right at the posterior genu there is 60% stenosis. No branch occlusion or beading. Negative for aneurysm. Posterior circulation: No meaningful flow in the left vertebral artery, chronic. The right V4 segment is diffusely patent. Chronic moderate narrowing of the basilar distally. No branch occlusion or beading. Negative for aneurysm Venous sinuses: Patent Anatomic variants: Negative Review of the MIP images confirms the above findings IMPRESSION: 1. Low-density in the ventral pons that is new from January 2021. History suggests acute infarct but this would have an atypical central location. Recommend brain MRI 2. No emergent vascular finding. 3. 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 4. Severe left cavernous ICA stenosis. 60% narrowing of the right cavernous ICA. Electronically Signed   By: Marnee Spring M.D.   On: 10/03/2019 06:12   MR BRAIN WO CONTRAST  Result Date: 10/03/2019 CLINICAL DATA:  Recurrent falls. Weakness. Abnormality in the pons on CT. EXAM: MRI HEAD WITHOUT CONTRAST TECHNIQUE: Multiplanar, multiecho pulse sequences of the brain and  surrounding structures were obtained without intravenous contrast. COMPARISON:  Head CT/CTA 10/03/2019 and MRI 09/11/2019 FINDINGS: Brain: There is a 15 x 12 mm acute infarct in the ventral pons located within and to the left of the midline. Punctate foci of diffusion weighted signal abnormality in the posterior cerebral hemispheres on the prior MRI have resolved. Small foci of T2 hyperintensity in the cerebral white matter bilaterally are unchanged and nonspecific but compatible with mildly age advanced chronic small vessel ischemic disease. A chronic lacunar infarct is again noted in the left thalamus. The ventricles and sulci are within normal limits for age. No intracranial hemorrhage, mass, midline shift, or extra-axial fluid collection is identified. Vascular: Unchanged abnormal appearance of the distal left vertebral artery with only thready intermittent flow shown on CTA. Skull and upper cervical spine: Unremarkable bone marrow signal. Sinuses/Orbits: Unremarkable orbits. Paranasal sinuses and mastoid air cells are clear. Other: None. IMPRESSION: 1. Acute pontine infarct. 2. Mild chronic small vessel ischemic disease. Electronically Signed   By: Sebastian Ache M.D.   On: 10/03/2019 07:54    EKG: Independently reviewed.  NSR with rate 81; nonspecific ST changes with no evidence of acute ischemia   Labs on Admission: I have personally reviewed the available labs and imaging studies at the time of the admission.  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) Active Problems:   Hypothyroidism (acquired)   Hypertension   Dyslipidemia   Diabetes mellitus without complication (HCC)   Acute pontine infarct with h/o recent CVA -Patient previously admitted 1/31-2/1 for CVA - embolic-appearing  lesions in the parieto-occipital regions B -She has had ongoing concerns since returning home with gait instability  -She fell yesterday and with ongoing weakness and balance issues decided to come in -Repeat MRI shows an acute pontine infarct -Will admit for further CVA evaluation -Telemetry monitoring -Echo previously unremarkable; likely needs TEE at this time so cardiology consulted (planned study for tomorrow; patient is NPO after MN) -Vascular transcranial doppler with bubble study also requested -Continue DAPT with ASA and Plavix daily; she reports compliance -  Neurology consult -PT/OT/ST/Nutrition Consults  HTN -Allow permissive HTN for now -Treat BP only if >220/120, and then with goal of 15% reduction -Hold lisinopril and plan to restart in 48-72 hours   HLD -Lipids on 2/1 were 172/21/117/168 -Continue Lipitor but increase to 80 mg daily   DM -Recent A1c shows poor control (8.7) -Hold home PO medications (Glucotrol, Jentadueto) -Will order moderate-scale SSI   Hypothyroidism -Check TSH -Continue Synthroid at current dose for now    Note: This patient has been tested and is negative for the novel coronavirus COVID-19.     DVT prophylaxis:  Lovenox  Code Status: Full - confirmed with patient Family Communication:  None present; I called and updated her mother in the evening Disposition Plan:  Home once clinically improved Consults called: Neurology; PT/OT/ST/Nutrition  Admission status: Admit - It is my clinical opinion that admission to INPATIENT is reasonable and necessary because of the expectation that this patient will require hospital care that crosses at least 2 midnights to treat this condition based on the medical complexity of the problems presented.  Given the aforementioned information, the predictability of an adverse outcome is felt to be significant.    Karmen Bongo MD [facility]   How to contact the [facility] Attending or Consulting provider  [place] or covering provider during after hours [place], for this patient?  1. Check the care team in [facility] and look for a) attending/consulting [facility] provider listed and b) the [facility] team listed 2. Log into [email or web address] and use [place]'s universal password to access. If you do not have the password, please contact the hospital operator. 3. Locate the [facility] provider you are looking for under [facility] and page to a number that you can be directly reached. 4. If you still have difficulty reaching the provider, please page the [facility] (Director on Call) for the Hospitalists listed on amion for assistance.   10/03/2019, 6:46 PM

## 2019-10-03 NOTE — ED Provider Notes (Signed)
[facility] EMERGENCY DEPARTMENT Provider Note   CSN: [id number] Arrival date & time: 10/03/19  7510     History Chief Complaint  Patient presents with  . Weakness  . Fall    Amy Raymond is a 54 y.o. female.  The history is provided by the patient and medical records. No language interpreter was used.  Weakness Fall      54 year old female with history of diabetes, CVA diagnosed last month when she developed dysarthria and acute vision loss presenting here with generalized weakness and recurrent falls.  Patient states since she was diagnosed with having several mini stroke last month, she has not been feeling the same.  She endorsed generalized weakness especially with walking.  She has fallen twice within the past few days without any significant injury from her fall.  She does not complain of any fever or chills no severe headache, vision changes, neck pain, chest pain, trouble breathing, abdominal pain, back pain, focal numbness or focal weakness, dysuria, or rash.  Patient was concerned that she may have additional TIA causing her weakness and recurrent falls.  Past Medical History:  Diagnosis Date  . Diabetes mellitus without complication ([place])   . Thyroid disease     Patient Active Problem List   Diagnosis Date Noted  . Intractable headache 09/11/2019  . Hypothyroidism 09/11/2019  . Type 2 diabetes mellitus without complication ([place]) 25/85/2778  . CVA (cerebral vascular accident) ([place]) 09/11/2019  . Elevated liver enzymes 09/11/2019    No past surgical history on file.   OB History   No obstetric history on file.     No family history on file.  Social History   Tobacco Use  . Smoking status: Never Smoker  . Smokeless tobacco: Never Used  Substance Use Topics  . Alcohol use: No  . Drug use: No    Home Medications Prior to Admission medications   Medication Sig Start Date End Date Taking? Authorizing Provider  aspirin 81 MG EC  tablet Take 1 tablet (81 mg total) by mouth daily. Take aspirin with Plavix for 3 months.  Then stop the aspirin and continue Plavix indefinitely. 09/13/19  Yes Georgette Shell, MD  atorvastatin (LIPITOR) 40 MG tablet Take 1 tablet (40 mg total) by mouth daily at 6 PM. 09/12/19  Yes Georgette Shell, MD  cholecalciferol (VITAMIN D3) 25 MCG (1000 UNIT) tablet Take 1,000 Units by mouth daily.   Yes [provider]  clopidogrel (PLAVIX) 75 MG tablet Take 1 tablet (75 mg total) by mouth daily. 09/13/19  Yes Georgette Shell, MD  ferrous sulfate 325 (65 FE) MG tablet Take 325 mg by mouth daily with breakfast.   Yes [provider]  glipiZIDE (GLUCOTROL) 10 MG tablet Take 10 mg by mouth 2 (two) times daily. 08/22/19  Yes [provider]  levothyroxine (SYNTHROID) 175 MCG tablet Take 1 tablet (175 mcg total) by mouth daily. 09/13/19  Yes Georgette Shell, MD  Linagliptin-Metformin HCl (JENTADUETO) 2.12-998 MG TABS Take 1 tablet by mouth 2 (two) times daily.     Yes [provider]  lisinopril (ZESTRIL) 10 MG tablet Take 10 mg by mouth daily. 09/28/19  Yes [provider]  ondansetron (ZOFRAN) 4 MG tablet Take 1 tablet (4 mg total) by mouth every 6 (six) hours as needed for nausea. 09/12/19  Yes Georgette Shell, MD    Allergies    Patient has no known allergies.  Review of Systems  Review of Systems  Neurological: Positive for weakness.  All other systems reviewed and are negative.   Physical Exam Updated Vital Signs BP (!) 124/56   Pulse 86   Temp 98.6 F (37 C) (Oral)   Resp 18   Ht 5\' 4"  (1.626 m)   Wt 77 kg   SpO2 98%   BMI 29.14 kg/m   Physical Exam Vitals and nursing note reviewed.  Constitutional:      General: She is not in acute distress.    Appearance: She is well-developed.  HENT:     Head: Atraumatic.  Eyes:     Conjunctiva/sclera: Conjunctivae normal.  Cardiovascular:     Rate and Rhythm: Normal rate and regular  rhythm.     Pulses: Normal pulses.     Heart sounds: Normal heart sounds.  Pulmonary:     Effort: Pulmonary effort is normal.     Breath sounds: Normal breath sounds.  Abdominal:     Palpations: Abdomen is soft.     Tenderness: There is no abdominal tenderness.  Musculoskeletal:     Cervical back: Neck supple.     Comments: 5/5 strength to all 4 extremities  Skin:    Findings: No rash.  Neurological:     Mental Status: She is alert and oriented to person, place, and time.     Comments: Neurologic exam:  Speech with some dysarthria, pupils equal round reactive to light, extraocular movements intact  Normal peripheral visual fields Cranial nerves III through XII normal including no facial droop Follows commands, moves all extremities x4, normal strength to bilateral upper and lower extremities at all major muscle groups including grip Sensation normal to light touch  Coordination intact, no limb ataxia, finger-nose-finger normal Rapid alternating movements normal No pronator drift Gait not tested      ED Results / Procedures / Treatments   Labs (all labs ordered are listed, but only abnormal results are displayed) Labs Reviewed  CBC - Abnormal; Notable for the following components:      Result Value   Hemoglobin 10.1 (*)    HCT 32.2 (*)    MCV 76.7 (*)    MCH 24.0 (*)    Platelets 524 (*)    All other components within normal limits  COMPREHENSIVE METABOLIC PANEL - Abnormal; Notable for the following components:   Potassium 3.3 (*)    Glucose, Bld 180 (*)    Albumin 3.1 (*)    Alkaline Phosphatase 137 (*)    All other components within normal limits  CBG MONITORING, ED - Abnormal; Notable for the following components:   Glucose-Capillary 165 (*)    All other components within normal limits  ETHANOL  RAPID URINE DRUG SCREEN, HOSP PERFORMED  URINALYSIS, ROUTINE W REFLEX MICROSCOPIC    EKG None  Radiology CT Angio Head W or Wo Contrast  Result Date:  10/03/2019 CLINICAL DATA:  Found unresponsive. EXAM: CT ANGIOGRAPHY HEAD AND NECK TECHNIQUE: Multidetector CT imaging of the head and neck was performed using the standard protocol during bolus administration of intravenous contrast. Multiplanar CT image reconstructions and MIPs were obtained to evaluate the vascular anatomy. Carotid stenosis measurements (when applicable) are obtained utilizing NASCET criteria, using the distal internal carotid diameter as the denominator. CONTRAST:  58mL OMNIPAQUE IOHEXOL 350 MG/ML SOLN COMPARISON:  09/10/2019 FINDINGS: CT HEAD FINDINGS Brain: Low-density in the ventral midline pons, new. No acute hemorrhage, hydrocephalus, or masslike finding. Vascular: See below Skull: Negative Sinuses: Clear Orbits: Negative Review of the MIP  images confirms the above findings CTA NECK FINDINGS Aortic arch: 2 vessel arch.  No acute finding Right carotid system: Atheromatous wall thickening of the common carotid and likely of the bulb. No flow limiting stenosis or ulceration. Left carotid system: Low-density atheromatous wall thickening of the common carotid and likely of the bulb. No ulceration flow limiting stenosis Vertebral arteries: No proximal subclavian stenosis. The non dominant left vertebral artery shows thready flow with no meaningful patency at the level of the dura. Right vertebral artery is smoothly contoured and patent, partially obscured by intravenous contrast at the V1 segment. Skeleton: Cervical disc degeneration. Other neck: No acute or aggressive finding. Upper chest: Negative Review of the MIP images confirms the above findings CTA HEAD FINDINGS Anterior circulation: Atherosclerotic plaque at the carotid siphons with left more than right stenosis. At least 75% narrowing is seen at the posterior and anterior genu on the left. On the right at the posterior genu there is 60% stenosis. No branch occlusion or beading. Negative for aneurysm. Posterior circulation: No meaningful  flow in the left vertebral artery, chronic. The right V4 segment is diffusely patent. Chronic moderate narrowing of the basilar distally. No branch occlusion or beading. Negative for aneurysm Venous sinuses: Patent Anatomic variants: Negative Review of the MIP images confirms the above findings IMPRESSION: 1. Low-density in the ventral pons that is new from January 2021. History suggests acute infarct but this would have an atypical central location. Recommend brain MRI 2. No emergent vascular finding. 3. 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 4. Severe left cavernous ICA stenosis. 60% narrowing of the right cavernous ICA. Electronically Signed   By: Marnee Spring M.D.   On: 10/03/2019 06:12   CT Angio Neck W and/or Wo Contrast  Result Date: 10/03/2019 CLINICAL DATA:  Found unresponsive. EXAM: CT ANGIOGRAPHY HEAD AND NECK TECHNIQUE: Multidetector CT imaging of the head and neck was performed using the standard protocol during bolus administration of intravenous contrast. Multiplanar CT image reconstructions and MIPs were obtained to evaluate the vascular anatomy. Carotid stenosis measurements (when applicable) are obtained utilizing NASCET criteria, using the distal internal carotid diameter as the denominator. CONTRAST:  63mL OMNIPAQUE IOHEXOL 350 MG/ML SOLN COMPARISON:  09/10/2019 FINDINGS: CT HEAD FINDINGS Brain: Low-density in the ventral midline pons, new. No acute hemorrhage, hydrocephalus, or masslike finding. Vascular: See below Skull: Negative Sinuses: Clear Orbits: Negative Review of the MIP images confirms the above findings CTA NECK FINDINGS Aortic arch: 2 vessel arch.  No acute finding Right carotid system: Atheromatous wall thickening of the common carotid and likely of the bulb. No flow limiting stenosis or ulceration. Left carotid system: Low-density atheromatous wall thickening of the common carotid and likely of the bulb. No ulceration  flow limiting stenosis Vertebral arteries: No proximal subclavian stenosis. The non dominant left vertebral artery shows thready flow with no meaningful patency at the level of the dura. Right vertebral artery is smoothly contoured and patent, partially obscured by intravenous contrast at the V1 segment. Skeleton: Cervical disc degeneration. Other neck: No acute or aggressive finding. Upper chest: Negative Review of the MIP images confirms the above findings CTA HEAD FINDINGS Anterior circulation: Atherosclerotic plaque at the carotid siphons with left more than right stenosis. At least 75% narrowing is seen at the posterior and anterior genu on the left. On the right at the posterior genu there is 60% stenosis. No branch occlusion or beading. Negative for aneurysm. Posterior circulation: No meaningful flow  in the left vertebral artery, chronic. The right V4 segment is diffusely patent. Chronic moderate narrowing of the basilar distally. No branch occlusion or beading. Negative for aneurysm Venous sinuses: Patent Anatomic variants: Negative Review of the MIP images confirms the above findings IMPRESSION: 1. Low-density in the ventral pons that is new from January 2021. History suggests acute infarct but this would have an atypical central location. Recommend brain MRI 2. No emergent vascular finding. 3. 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 4. Severe left cavernous ICA stenosis. 60% narrowing of the right cavernous ICA. Electronically Signed   By: Marnee Spring M.D.   On: 10/03/2019 06:12    Procedures Procedures (including critical care time)  Medications Ordered in ED Medications  sodium chloride 0.9 % bolus 1,000 mL (has no administration in time range)  iohexol (OMNIPAQUE) 350 MG/ML injection 75 mL (75 mLs Intravenous Contrast Given 10/03/19 0543)    ED Course  I have reviewed the triage vital signs and the nursing notes.  Pertinent labs &  imaging results that were available during my care of the patient were reviewed by me and considered in my medical decision making (see chart for details).    MDM Rules/Calculators/A&P                      BP 110/79   Pulse 74   Temp 98.6 F (37 C) (Oral)   Resp 18   Ht 5\' 4"  (1.626 m)   Wt 77 kg   SpO2 100%   BMI 29.14 kg/m   Final Clinical Impression(s) / ED Diagnoses Final diagnoses:  None    Rx / DC Orders ED Discharge Orders    None     5:31 AM Patient previously diagnosed with CVA involving vertebral artery occlusion last month here with recurrent falls, generalized weakness and difficulty walking since being discharged a month ago.  She denies any new focal deficit.  She denies any focal weakness.  On exam she does have some mild dysarthria however no obvious focal deficit appreciated.  Strength is equal throughout.  She is mentating appropriately.  6:22 AM Evidence of orthostatic hypotension which may contribute to her generalized weakness and falls.  IV fluid given.  Headaches CT angiogram obtained shown low density in the central pons that is new from January and suggest acute infarct but this could be an atypical central location.  Recommend brain MRI.  Evidence of severe left ICA stenosis as well as 60% narrowing of the right cavernous ICA.  Will obtain brain MRI for further evaluation.  Patient signed out to oncoming team.   February, PA-C 10/03/19 0650    Mesner, 10/05/19, MD 10/10/19 [phone]

## 2019-10-03 NOTE — ED Notes (Signed)
Pt transported to MRI 

## 2019-10-03 NOTE — ED Notes (Signed)
Pt was able to ambulate to Bathroom with minimal assistance.

## 2019-10-03 NOTE — ED Provider Notes (Signed)
Amy Raymond is a 54 y.o. female, presenting to the ED with generalized weakness that she states has been present since her hospital discharge September 12, 2019.  She has noted worsening in symptoms over the last couple days with falls she states stemmed from feeling unbalanced. Denies head injury, difficulty swallowing, difficulty breathing, facial droop, focal weakness, sensation changes.   HPI from Fayrene Helper, PA-C: "54 year old female with history of diabetes, CVA diagnosed last month when she developed dysarthria and acute vision loss presenting here with generalized weakness and recurrent falls.  Patient states since she was diagnosed with having several mini stroke last month, she has not been feeling the same.  She endorsed generalized weakness especially with walking.  She has fallen twice within the past few days without any significant injury from her fall.  She does not complain of any fever or chills no severe headache, vision changes, neck pain, chest pain, trouble breathing, abdominal pain, back pain, focal numbness or focal weakness, dysuria, or rash.  Patient was concerned that she may have additional TIA causing her weakness and recurrent falls."  Past Medical History:  Diagnosis Date  . Diabetes mellitus without complication (HCC)   . Thyroid disease      Physical Exam  BP 110/79   Pulse 74   Temp 98.6 F (37 C) (Oral)   Resp 18   Ht 5\' 4"  (1.626 m)   Wt 77 kg   SpO2 100%   BMI 29.14 kg/m   Physical Exam Vitals and nursing note reviewed.  Constitutional:      General: She is not in acute distress.    Appearance: She is well-developed. She is not diaphoretic.  HENT:     Head: Normocephalic and atraumatic.     Mouth/Throat:     Mouth: Mucous membranes are moist.     Pharynx: Oropharynx is clear.  Eyes:     Conjunctiva/sclera: Conjunctivae normal.  Cardiovascular:     Rate and Rhythm: Normal rate and regular rhythm.     Pulses: Normal pulses.          Radial  pulses are 2+ on the right side and 2+ on the left side.       Posterior tibial pulses are 2+ on the right side and 2+ on the left side.     Heart sounds: Normal heart sounds.     Comments: Tactile temperature in the extremities appropriate and equal bilaterally. Pulmonary:     Effort: Pulmonary effort is normal. No respiratory distress.     Breath sounds: Normal breath sounds.  Abdominal:     Palpations: Abdomen is soft.     Tenderness: There is no abdominal tenderness. There is no guarding.  Musculoskeletal:     Cervical back: Neck supple.     Right lower leg: No edema.     Left lower leg: No edema.  Lymphadenopathy:     Cervical: No cervical adenopathy.  Skin:    General: Skin is warm and dry.  Neurological:     Mental Status: She is alert and oriented to person, place, and time.     Comments: No noted acute cognitive deficit. Sensation grossly intact to light touch in the extremities.   Grip strengths equal bilaterally.   Strength 5/5 in all extremities.  Possible slight deviation of the tongue towards the right. Inaccuracy with finger-to-nose and heel-to-shin testing suggesting ataxia. Cranial nerves III-XII otherwise grossly intact.  Handles oral secretions without noted difficulty.  No noted phonation or  speech deficit. No facial droop.   Psychiatric:        Mood and Affect: Mood and affect normal.        Speech: Speech normal.        Behavior: Behavior normal.     ED Course/Procedures     .Critical Care Performed by: Anselm Pancoast, PA-C Authorized by: Anselm Pancoast, PA-C   Critical care provider statement:    Critical care time (minutes):  35   Critical care time was exclusive of:  Separately billable procedures and treating other patients   Critical care was necessary to treat or prevent imminent or life-threatening deterioration of the following conditions:  CNS failure or compromise   Critical care was time spent personally by me on the following activities:   Discussions with consultants, development of treatment plan with patient or surrogate, re-evaluation of patient's condition, review of old charts, obtaining history from patient or surrogate and examination of patient   I assumed direction of critical care for this patient from another provider in my specialty: yes       Abnormal Labs Reviewed  CBC - Abnormal; Notable for the following components:      Result Value   Hemoglobin 10.1 (*)    HCT 32.2 (*)    MCV 76.7 (*)    MCH 24.0 (*)    Platelets 524 (*)    All other components within normal limits  COMPREHENSIVE METABOLIC PANEL - Abnormal; Notable for the following components:   Potassium 3.3 (*)    Glucose, Bld 180 (*)    Albumin 3.1 (*)    Alkaline Phosphatase 137 (*)    All other components within normal limits  URINALYSIS, ROUTINE W REFLEX MICROSCOPIC - Abnormal; Notable for the following components:   Specific Gravity, Urine <1.005 (*)    Ketones, ur 15 (*)    All other components within normal limits  CBG MONITORING, ED - Abnormal; Notable for the following components:   Glucose-Capillary 165 (*)    All other components within normal limits   Hemoglobin  Date Value Ref Range Status  10/03/2019 10.1 (L) 12.0 - 15.0 g/dL Final  03/47/4259 56.3 (L) 12.0 - 15.0 g/dL Final  87/56/4332 95.1 (L) 12.0 - 15.0 g/dL Final  88/41/6606 30.1 12.0 - 15.0 g/dL Final    CT Angio Head W or Wo Contrast  Result Date: 10/03/2019 CLINICAL DATA:  Found unresponsive. EXAM: CT ANGIOGRAPHY HEAD AND NECK TECHNIQUE: Multidetector CT imaging of the head and neck was performed using the standard protocol during bolus administration of intravenous contrast. Multiplanar CT image reconstructions and MIPs were obtained to evaluate the vascular anatomy. Carotid stenosis measurements (when applicable) are obtained utilizing NASCET criteria, using the distal internal carotid diameter as the denominator. CONTRAST:  65mL OMNIPAQUE IOHEXOL 350 MG/ML SOLN  COMPARISON:  09/10/2019 FINDINGS: CT HEAD FINDINGS Brain: Low-density in the ventral midline pons, new. No acute hemorrhage, hydrocephalus, or masslike finding. Vascular: See below Skull: Negative Sinuses: Clear Orbits: Negative Review of the MIP images confirms the above findings CTA NECK FINDINGS Aortic arch: 2 vessel arch.  No acute finding Right carotid system: Atheromatous wall thickening of the common carotid and likely of the bulb. No flow limiting stenosis or ulceration. Left carotid system: Low-density atheromatous wall thickening of the common carotid and likely of the bulb. No ulceration flow limiting stenosis Vertebral arteries: No proximal subclavian stenosis. The non dominant left vertebral artery shows thready flow with no meaningful patency at the level of  the dura. Right vertebral artery is smoothly contoured and patent, partially obscured by intravenous contrast at the V1 segment. Skeleton: Cervical disc degeneration. Other neck: No acute or aggressive finding. Upper chest: Negative Review of the MIP images confirms the above findings CTA HEAD FINDINGS Anterior circulation: Atherosclerotic plaque at the carotid siphons with left more than right stenosis. At least 75% narrowing is seen at the posterior and anterior genu on the left. On the right at the posterior genu there is 60% stenosis. No branch occlusion or beading. Negative for aneurysm. Posterior circulation: No meaningful flow in the left vertebral artery, chronic. The right V4 segment is diffusely patent. Chronic moderate narrowing of the basilar distally. No branch occlusion or beading. Negative for aneurysm Venous sinuses: Patent Anatomic variants: Negative Review of the MIP images confirms the above findings IMPRESSION: 1. Low-density in the ventral pons that is new from January 2021. History suggests acute infarct but this would have an atypical central location. Recommend brain MRI 2. No emergent vascular finding. 3. Chronic moderate  basilar stenosis and thready flow in the non dominant left vertebral artery. 4. Severe left cavernous ICA stenosis. 60% narrowing of the right cavernous ICA. Electronically Signed   By: Marnee Spring M.D.   On: 10/03/2019 06:12   CT Angio Neck W and/or Wo Contrast  Result Date: 10/03/2019 CLINICAL DATA:  Found unresponsive. EXAM: CT ANGIOGRAPHY HEAD AND NECK TECHNIQUE: Multidetector CT imaging of the head and neck was performed using the standard protocol during bolus administration of intravenous contrast. Multiplanar CT image reconstructions and MIPs were obtained to evaluate the vascular anatomy. Carotid stenosis measurements (when applicable) are obtained utilizing NASCET criteria, using the distal internal carotid diameter as the denominator. CONTRAST:  36mL OMNIPAQUE IOHEXOL 350 MG/ML SOLN COMPARISON:  09/10/2019 FINDINGS: CT HEAD FINDINGS Brain: Low-density in the ventral midline pons, new. No acute hemorrhage, hydrocephalus, or masslike finding. Vascular: See below Skull: Negative Sinuses: Clear Orbits: Negative Review of the MIP images confirms the above findings CTA NECK FINDINGS Aortic arch: 2 vessel arch.  No acute finding Right carotid system: Atheromatous wall thickening of the common carotid and likely of the bulb. No flow limiting stenosis or ulceration. Left carotid system: Low-density atheromatous wall thickening of the common carotid and likely of the bulb. No ulceration flow limiting stenosis Vertebral arteries: No proximal subclavian stenosis. The non dominant left vertebral artery shows thready flow with no meaningful patency at the level of the dura. Right vertebral artery is smoothly contoured and patent, partially obscured by intravenous contrast at the V1 segment. Skeleton: Cervical disc degeneration. Other neck: No acute or aggressive finding. Upper chest: Negative Review of the MIP images confirms the above findings CTA HEAD FINDINGS Anterior circulation: Atherosclerotic plaque at  the carotid siphons with left more than right stenosis. At least 75% narrowing is seen at the posterior and anterior genu on the left. On the right at the posterior genu there is 60% stenosis. No branch occlusion or beading. Negative for aneurysm. Posterior circulation: No meaningful flow in the left vertebral artery, chronic. The right V4 segment is diffusely patent. Chronic moderate narrowing of the basilar distally. No branch occlusion or beading. Negative for aneurysm Venous sinuses: Patent Anatomic variants: Negative Review of the MIP images confirms the above findings IMPRESSION: 1. Low-density in the ventral pons that is new from January 2021. History suggests acute infarct but this would have an atypical central location. Recommend brain MRI 2. No emergent vascular finding. 3. Chronic moderate basilar stenosis and  thready flow in the non dominant left vertebral artery. 4. Severe left cavernous ICA stenosis. 60% narrowing of the right cavernous ICA. Electronically Signed   By: Monte Fantasia M.D.   On: 10/03/2019 06:12   MR BRAIN WO CONTRAST  Result Date: 10/03/2019 CLINICAL DATA:  Recurrent falls. Weakness. Abnormality in the pons on CT. EXAM: MRI HEAD WITHOUT CONTRAST TECHNIQUE: Multiplanar, multiecho pulse sequences of the brain and surrounding structures were obtained without intravenous contrast. COMPARISON:  Head CT/CTA 10/03/2019 and MRI 09/11/2019 FINDINGS: Brain: There is a 15 x 12 mm acute infarct in the ventral pons located within and to the left of the midline. Punctate foci of diffusion weighted signal abnormality in the posterior cerebral hemispheres on the prior MRI have resolved. Small foci of T2 hyperintensity in the cerebral white matter bilaterally are unchanged and nonspecific but compatible with mildly age advanced chronic small vessel ischemic disease. A chronic lacunar infarct is again noted in the left thalamus. The ventricles and sulci are within normal limits for age. No  intracranial hemorrhage, mass, midline shift, or extra-axial fluid collection is identified. Vascular: Unchanged abnormal appearance of the distal left vertebral artery with only thready intermittent flow shown on CTA. Skull and upper cervical spine: Unremarkable bone marrow signal. Sinuses/Orbits: Unremarkable orbits. Paranasal sinuses and mastoid air cells are clear. Other: None. IMPRESSION: 1. Acute pontine infarct. 2. Mild chronic small vessel ischemic disease. Electronically Signed   By: Logan Bores M.D.   On: 10/03/2019 07:54    MDM    Clinical Course as of Oct 02 1024  Mon Oct 03, 2019  0838 Spoke with Dr. Rory Percy, neurologist.We reviewed the patient's symptoms and MRI.  He states patient will need even more extensive work-up than she had with previous, recent stroke admission.  PT/OT evaluation.  Likely observation admission. Continue dual antiplatelet therapy.   [SJ]  26 Spoke with Dr. Lorin Mercy, hospitalist.  Agrees to admit the patient.   [SJ]    Clinical Course User Index [SJ] Lorayne Bender, PA-C    Patient care handoff report received from Domenic Moras, PA-C. Plan: Pending MRI.  Patient presents with a couple days of worsened generalized weakness as well as some feeling of unsteadiness.  She does have some signs of ataxia on exam. MRI with evidence of acute pontine infarct.  This was a different area from where her previous small infarcts were found during her recent admission September 11, 2019. Admission for further work-up.  Findings and plan of care discussed with Deno Etienne, DO. Dr. Tyrone Nine personally evaluated and examined this patient.  Vitals:   10/03/19 0437 10/03/19 0451 10/03/19 0500 10/03/19 0530  BP:   (!) 124/56 110/79  Pulse: 84  86 74  Resp: 14  18 18   Temp:  98.6 F (37 C)    TempSrc:  Oral    SpO2: 98%  98% 100%  Weight:      Height:       Vitals:   10/03/19 0451 10/03/19 0500 10/03/19 0530 10/03/19 0645  BP:  (!) 124/56 110/79 (!) 151/63  Pulse:  86 74    Resp:  18 18 (!) 22  Temp: 98.6 F (37 C)     TempSrc: Oral     SpO2:  98% 100%   Weight:      Height:          Lorayne Bender, PA-C 10/03/19 Pigeon, Dan, DO 10/03/19 1452

## 2019-10-04 ENCOUNTER — Other Ambulatory Visit (HOSPITAL_COMMUNITY): Payer: BLUE CROSS/BLUE SHIELD

## 2019-10-04 ENCOUNTER — Encounter (HOSPITAL_COMMUNITY): Payer: BLUE CROSS/BLUE SHIELD

## 2019-10-04 ENCOUNTER — Encounter (HOSPITAL_COMMUNITY): Payer: Self-pay | Admitting: Internal Medicine

## 2019-10-04 ENCOUNTER — Encounter (HOSPITAL_COMMUNITY)
Admission: EM | Disposition: A | Discharge status: Home / Self Care | Payer: Self-pay | Source: Home / Self Care | Attending: Internal Medicine

## 2019-10-04 ENCOUNTER — Inpatient Hospital Stay (HOSPITAL_COMMUNITY): Payer: BLUE CROSS/BLUE SHIELD

## 2019-10-04 DIAGNOSIS — I639 Cerebral infarction, unspecified: Secondary | ICD-10-CM

## 2019-10-04 DIAGNOSIS — I6322 Cerebral infarction due to unspecified occlusion or stenosis of basilar arteries: Secondary | ICD-10-CM

## 2019-10-04 DIAGNOSIS — Z8673 Personal history of transient ischemic attack (TIA), and cerebral infarction without residual deficits: Secondary | ICD-10-CM

## 2019-10-04 HISTORY — PX: TEE WITHOUT CARDIOVERSION: SHX5443

## 2019-10-04 HISTORY — PX: BUBBLE STUDY: SHX6837

## 2019-10-04 LAB — IRON AND TIBC
Iron: 32 ug/dL (ref 28–170)
Saturation Ratios: 16 % (ref 10.4–31.8)
TIBC: 196 ug/dL — ABNORMAL LOW (ref 250–450)
UIBC: 164 ug/dL

## 2019-10-04 LAB — GLUCOSE, CAPILLARY
Glucose-Capillary: 147 mg/dL — ABNORMAL HIGH (ref 70–99)
Glucose-Capillary: 107 mg/dL — ABNORMAL HIGH (ref 70–99)
Glucose-Capillary: 83 mg/dL (ref 70–99)

## 2019-10-04 LAB — TSH: TSH: 0.087 u[IU]/mL — ABNORMAL LOW (ref 0.350–4.500)

## 2019-10-04 LAB — VITAMIN B12: Vitamin B-12: 468 pg/mL (ref 180–914)

## 2019-10-04 SURGERY — ECHOCARDIOGRAM, TRANSESOPHAGEAL
Anesthesia: Moderate Sedation

## 2019-10-04 MED ORDER — ADULT MULTIVITAMIN W/MINERALS CH
1.0000 | ORAL_TABLET | Freq: Every day | ORAL | Status: DC
  Administered 2019-10-06: 1 via ORAL
  Filled 2019-10-04: qty 1

## 2019-10-04 MED ORDER — FENTANYL CITRATE (PF) 100 MCG/2ML IJ SOLN
INTRAMUSCULAR | Status: DC | PRN
  Administered 2019-10-04 (×2): 25 ug via INTRAVENOUS

## 2019-10-04 MED ORDER — GLUCERNA SHAKE PO LIQD
237.0000 mL | Freq: Three times a day (TID) | ORAL | Status: DC
  Administered 2019-10-05: 237 mL via ORAL

## 2019-10-04 MED ORDER — POTASSIUM CHLORIDE CRYS ER 20 MEQ PO TBCR
40.0000 meq | EXTENDED_RELEASE_TABLET | Freq: Once | ORAL | Status: AC
  Administered 2019-10-04: 40 meq via ORAL
  Filled 2019-10-04: qty 2

## 2019-10-04 MED ORDER — ASPIRIN EC 325 MG PO TBEC
325.0000 mg | DELAYED_RELEASE_TABLET | Freq: Every day | ORAL | Status: DC
  Administered 2019-10-05 – 2019-10-06 (×2): 325 mg via ORAL
  Filled 2019-10-04 (×2): qty 1

## 2019-10-04 MED ORDER — ENOXAPARIN SODIUM 40 MG/0.4ML ~~LOC~~ SOLN: 40.0000 mg | SUBCUTANEOUS | Status: DC

## 2019-10-04 MED ORDER — FENTANYL CITRATE (PF) 100 MCG/2ML IJ SOLN
INTRAMUSCULAR | Status: AC
  Filled 2019-10-04: qty 4

## 2019-10-04 MED ORDER — MIDAZOLAM HCL (PF) 10 MG/2ML IJ SOLN
INTRAMUSCULAR | Status: DC | PRN
  Administered 2019-10-04: 1 mg via INTRAVENOUS
  Administered 2019-10-04 (×2): 2 mg via INTRAVENOUS

## 2019-10-04 MED ORDER — BUTAMBEN-TETRACAINE-BENZOCAINE 2-2-14 % EX AERO
INHALATION_SPRAY | CUTANEOUS | Status: DC | PRN
  Administered 2019-10-04: 14:00:00 2 via TOPICAL

## 2019-10-04 MED ORDER — MIDAZOLAM HCL (PF) 5 MG/ML IJ SOLN
INTRAMUSCULAR | Status: AC
  Filled 2019-10-04: qty 2

## 2019-10-04 MED ORDER — DIPHENHYDRAMINE HCL 50 MG/ML IJ SOLN
INTRAMUSCULAR | Status: AC
  Filled 2019-10-04: qty 1

## 2019-10-04 NOTE — Progress Notes (Signed)
Initial Nutrition Assessment  RD working remotely.  DOCUMENTATION CODES:   Not applicable  INTERVENTION:   -Once diet is advanced, add:   -Glucerna Shake po TID, each supplement provides 220 kcal and 10 grams of protein -MVI with minerals daily  NUTRITION DIAGNOSIS:   Inadequate oral intake related to decreased appetite as evidenced by meal completion < 50%.  GOAL:   Patient will meet greater than or equal to 90% of their needs  MONITOR:   PO intake, Supplement acceptance, Diet advancement, Labs, Weight trends, Skin, I & O's  REASON FOR ASSESSMENT:   Consult Assessment of nutrition requirement/status  ASSESSMENT:   Amy Raymond is a 54 y.o. female with medical history significant of DM; HTN; HLD; and CVA 1/30 presenting with weakness, falls.  She reports that she fell yesterday.  She isn't able to get her balance right, seems to shift side to side with walking.  She was last admitted from 1/30-2/1 for an acute CVA.  She was discharged on ASA and Plavix.  Previously, she had headache and vision loss.  At the time of d/c, she was about 60% - so weak and light-headed.  She thought it might get better but it stayed the same.  She wasn't able to go anywhere because she was too weak.  The fall was the only thing that was clearly different yesterday.  She has not missed a dose of ASA/Plavix.  Pt admitted wrh acute pontine infarct with h/o recent CVA.   Reviewed I/O's: -213 ml x 24 hours  UOP: 400 ml x 24 hours  Pt NPO for TEE today. Previously, she was on a heart healthy, carb modified diet, consuming 50% of meals.  Pt down in endo suite. Unable to obtain further nutriton-related history at this time.   Reviewed wt hx; wt has been stable for one month. Reviewed Care Everywhere; last recorded wt was 166# on 06/02/17, so suspect wt stability over the past several years.   Lab Results  Component Value Date   HGBA1C 8.7 (H) 09/11/2019   PTA DM medications are 10 mg  glipizide BID and 2.12-998 mg linagliptin-metfomin HCl.   Labs reviewed: CBGS: 146-151 (inpatient orders for glycemic control are 0-15 units insulin aspart TID with meals).   Diet Order:   Diet Order            Diet NPO time specified Except for: Sips with Meds  Diet effective now        Diet NPO time specified Except for: Sips with Meds  Diet effective midnight              EDUCATION NEEDS:   No education needs have been identified at this time  Skin:  Skin Assessment: Reviewed RN Assessment  Last BM:  Unknown  Height:   Ht Readings from Last 1 Encounters:  10/03/19 5\' 4"  (1.626 m)    Weight:   Wt Readings from Last 1 Encounters:  10/03/19 77 kg    Ideal Body Weight:  54.5 kg  BMI:  Body mass index is 29.14 kg/m.  Estimated Nutritional Needs:   Kcal:  1750-1950  Protein:  90-105 grams  Fluid:  > 1.7 L    10/05/19, RD, LDN, CDCES Registered Dietitian II Certified Diabetes Care and Education Specialist Please refer to [facility] for RD and/or RD on-call/weekend/after hours pager

## 2019-10-04 NOTE — TOC Initial Note (Signed)
Transition of Care [facility]: [facility]) - Initial/Assessment Note    Patient Details  Name: Amy Raymond MRN: [id number] Date of Birth: 1966-07-15  Transition of Care [facility]) CM/SW Contact:    Kermit Balo, RN Phone Number: 10/04/2019, 4:30 PM  Clinical Narrative:                 Recommendations are for outpatient rehab. Pt agreeable to [facility]. Orders in Epic and information on the AVS.  Pt states she is going to stay with her mother at d/c. She says her mother can provide needed transportation.  Pt states she sometimes has trouble affording meds d/t cost. She asked that meds be kept affordable at d/c.  TOC following.  Expected Discharge Plan: OP Rehab Barriers to Discharge: Continued Medical Work up   Patient Goals and CMS Choice   CMS Medicare.gov Compare Post Acute Care list provided to:: Patient Choice offered to / list presented to : Patient  Expected Discharge Plan and Services Expected Discharge Plan: OP Rehab   Discharge Planning Services: CM Consult   Living arrangements for the past 2 months: Apartment                                      Prior Living Arrangements/Services Living arrangements for the past 2 months: Apartment Lives with:: Self Patient language and need for interpreter reviewed:: Yes Do you feel safe going back to the place where you live?: Yes      Need for Family Participation in Patient Care: Yes (Comment) Care giver support system in place?: Yes (comment)(going to stay with her mother)   Criminal Activity/Legal Involvement Pertinent to Current Situation/Hospitalization: No - Comment as needed  Activities of Daily Living Home Assistive Devices/Equipment: None ADL Screening (condition at time of admission) Patient's cognitive ability adequate to safely complete daily activities?: Yes Is the patient deaf or have difficulty hearing?: No Does the patient have difficulty seeing, even when wearing glasses/contacts?: No Does the  patient have difficulty concentrating, remembering, or making decisions?: No Patient able to express need for assistance with ADLs?: Yes Does the patient have difficulty dressing or bathing?: No Independently performs ADLs?: Yes (appropriate for developmental age) Does the patient have difficulty walking or climbing stairs?: No Weakness of Legs: None Weakness of Arms/Hands: None  Permission Sought/Granted                  Emotional Assessment Appearance:: Appears stated age Attitude/Demeanor/Rapport: Engaged Affect (typically observed): Accepting Orientation: : Oriented to Self, Oriented to Place, Oriented to  Time, Oriented to Situation   Psych Involvement: No (comment)  Admission diagnosis:  Stroke [facility]) [I63.9] Left pontine stroke (HCC) [I63.50] 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 Hypothyroidism (acquired)   . Hypertension   . Dyslipidemia   . Diabetes mellitus without complication (HCC)   . Intractable headache 09/11/2019  . Hypothyroidism 09/11/2019  . Type 2 diabetes mellitus without complication (HCC) 09/11/2019  . CVA (cerebral vascular accident) (HCC) 09/11/2019  . Elevated liver enzymes 09/11/2019   PCP:  [place], Julie M, NP Pharmacy:   [facility] [phone] - [place], [place] - [address] AT [address] [place] [address] Phone: [phone] Fax: [phone]     Social Determinants of Health (  SDOH) Interventions    Readmission Risk Interventions No flowsheet data found.

## 2019-10-04 NOTE — Progress Notes (Signed)
PROGRESS NOTE    Amy Raymond  KGU:[id number] DOB: 08/04/1966 DOA: 10/03/2019 PCP: [place], Julie M, NP   Brief Narrative:  54 year old with history of DM2, HTN, HLD, CVA presented with weakness and fall.  Admitted about 3 weeks ago for acute CVA discharged on aspirin and Plavix.  Denies medical noncompliance.  In the ER MRI showed acute pontine CVA.   Assessment & Plan:   Principal Problem:   Cerebrovascular accident (CVA) of pontine structure ([place]) Active Problems:   Hypothyroidism (acquired)   Hypertension   Dyslipidemia   Diabetes mellitus without complication ([place])  Acute pontine infarct with recent CVA -Continue aspirin and Plavix with statin -We will need bubble study. -TSH low 0.08, check free T4.  LDL 117 -Neurology team following -UDS-negative -CTA head shows severe left ICA stenosis 60% narrowing  Essential hypertension -Lisinopril on hold.  Permissive hypertension  Hyperlipidemia -Continue Lipitor.  Microcytic anemia -Check iron studies  Diabetes mellitus type 2, poorly controlled due to hyperglycemia -Holding home p.o. medication.  On sliding scale.  Hypothyroidism -Low TSH -Check free T4.  Continue Synthroid.  DVT prophylaxis: Lovenox Code Status: Full code Family Communication:   Disposition Plan:   Patient From= home  Patient Anticipated D/C place= Home  Barriers= further plans to be determined according to TEE results which will be done today.    Subjective: Laying in the bed, does not any complaints.  Overall tells me she feels okay.  Review of Systems Otherwise negative except as per HPI, including: General: Denies fever, chills, night sweats or unintended weight loss. Resp: Denies cough, wheezing, shortness of breath. Cardiac: Denies chest pain, palpitations, orthopnea, paroxysmal nocturnal dyspnea. GI: Denies abdominal pain, nausea, vomiting, diarrhea or constipation GU: Denies dysuria, frequency, hesitancy or incontinence MS:  Denies muscle aches, joint pain or swelling Neuro: Denies headache, neurologic deficits (focal weakness, numbness, tingling), abnormal gait Psych: Denies anxiety, depression, SI/HI/AVH Skin: Denies new rashes or lesions ID: Denies sick contacts, exotic exposures, travel  Examination:  General exam: Appears calm and comfortable, poor dentition Respiratory system: Clear to auscultation. Respiratory effort normal. Cardiovascular system: S1 & S2 heard, RRR. No JVD, murmurs, rubs, gallops or clicks. No pedal edema. Gastrointestinal system: Abdomen is nondistended, soft and nontender. No organomegaly or masses felt. Normal bowel sounds heard. Central nervous system: Alert and oriented.  Slight coordination ataxia more pronounced on the left.  Otherwise no other focal neuro deficits at this time. Extremities: Symmetric 5 x 5 power. Skin: No rashes, lesions or ulcers Psychiatry: Judgement and insight appear normal. Mood & affect appropriate.     Objective: Vitals:   10/03/19 2000 10/04/19 0001 10/04/19 0356 10/04/19 0755  BP: 135/74 (!) 150/65 (!) 154/69 (!) 145/59  Pulse: 72 72 72 71  Resp: 18 17 17 16   Temp: 98.3 F (36.8 C) 98.4 F (36.9 C) 98.2 F (36.8 C) 98.3 F (36.8 C)  TempSrc: Oral Oral Oral Oral  SpO2: 100% 100% 100% 100%  Weight:      Height:        Intake/Output Summary (Last 24 hours) at 10/04/2019 0829 Last data filed at 10/03/2019 1653 Gross per 24 hour  Intake 186.98 ml  Output 400 ml  Net -213.02 ml   Filed Weights   10/03/19 0435  Weight: 77 kg     Data Reviewed:   CBC: Recent Labs  Lab 10/03/19 0445  WBC 8.4  HGB 10.1*  HCT 32.2*  MCV 76.7*  PLT 628*   Basic Metabolic Panel: Recent Labs  Lab 10/03/19 0445  NA 140  K 3.3*  CL 101  CO2 24  GLUCOSE 180*  BUN 9  CREATININE 0.79  CALCIUM 9.3   GFR: Estimated Creatinine Clearance: 79.8 mL/min (by C-G formula based on SCr of 0.79 mg/dL). Liver Function Tests: Recent Labs  Lab  10/03/19 0445  AST 23  ALT 21  ALKPHOS 137*  BILITOT 0.6  PROT 7.5  ALBUMIN 3.1*   No results for input(s): LIPASE, AMYLASE in the last 168 hours. No results for input(s): AMMONIA in the last 168 hours. Coagulation Profile: Recent Labs  Lab 10/03/19 0921  INR 1.1   Cardiac Enzymes: No results for input(s): CKTOTAL, CKMB, CKMBINDEX, TROPONINI in the last 168 hours. BNP (last 3 results) No results for input(s): PROBNP in the last 8760 hours. HbA1C: No results for input(s): HGBA1C in the last 72 hours. CBG: Recent Labs  Lab 10/03/19 0449 10/03/19 1125 10/03/19 1634 10/03/19 2115 10/04/19 0615  GLUCAP 165* 94 151* 146* 147*   Lipid Profile: No results for input(s): CHOL, HDL, LDLCALC, TRIG, CHOLHDL, LDLDIRECT in the last 72 hours. Thyroid Function Tests: Recent Labs    10/04/19 0338  TSH 0.087*   Anemia Panel: No results for input(s): VITAMINB12, FOLATE, FERRITIN, TIBC, IRON, RETICCTPCT in the last 72 hours. Sepsis Labs: No results for input(s): PROCALCITON, LATICACIDVEN in the last 168 hours.  Recent Results (from the past 240 hour(s))  SARS CORONAVIRUS 2 (TAT 6-24 HRS) Nasopharyngeal Nasopharyngeal Swab     Status: None   Collection Time: 10/03/19  9:45 AM   Specimen: Nasopharyngeal Swab  Result Value Ref Range Status   SARS Coronavirus 2 NEGATIVE NEGATIVE Final    Comment: (NOTE) SARS-CoV-2 target nucleic acids are NOT DETECTED. The SARS-CoV-2 RNA is generally detectable in upper and lower respiratory specimens during the acute phase of infection. Negative results do not preclude SARS-CoV-2 infection, do not rule out co-infections with other pathogens, and should not be used as the sole basis for treatment or other patient management decisions. Negative results must be combined with clinical observations, patient history, and epidemiological information. The expected result is Negative. Fact Sheet for  Patients: HairSlick.no Fact Sheet for Healthcare Providers: quierodirigir.com This test is not yet approved or cleared by the [place] FDA and  has been authorized for detection and/or diagnosis of SARS-CoV-2 by FDA under an Emergency Use Authorization (EUA). This EUA will remain  in effect (meaning this test can be used) for the duration of the COVID-19 declaration under Section 56 4(b)(1) of the Act, 21 U.S.C. section 360bbb-3(b)(1), unless the authorization is terminated or revoked sooner. Performed at [facility], [address] [place], [place] [address]          Radiology Studies: CT Angio Head W or Wo Contrast  Result Date: 10/03/2019 CLINICAL DATA:  Found unresponsive. EXAM: CT ANGIOGRAPHY HEAD AND NECK TECHNIQUE: Multidetector CT imaging of the head and neck was performed using the standard protocol during bolus administration of intravenous contrast. Multiplanar CT image reconstructions and MIPs were obtained to evaluate the vascular anatomy. Carotid stenosis measurements (when applicable) are obtained utilizing NASCET criteria, using the distal internal carotid diameter as the denominator. CONTRAST:  63mL OMNIPAQUE IOHEXOL 350 MG/ML SOLN COMPARISON:  09/10/2019 FINDINGS: CT HEAD FINDINGS Brain: Low-density in the ventral midline pons, new. No acute hemorrhage, hydrocephalus, or masslike finding. Vascular: See below Skull: Negative Sinuses: Clear Orbits: Negative Review of the MIP images confirms the above findings CTA NECK FINDINGS Aortic arch: 2  vessel arch.  No acute finding Right carotid system: Atheromatous wall thickening of the common carotid and likely of the bulb. No flow limiting stenosis or ulceration. Left carotid system: Low-density atheromatous wall thickening of the common carotid and likely of the bulb. No ulceration flow limiting stenosis Vertebral arteries: No proximal subclavian stenosis. The non  dominant left vertebral artery shows thready flow with no meaningful patency at the level of the dura. Right vertebral artery is smoothly contoured and patent, partially obscured by intravenous contrast at the V1 segment. Skeleton: Cervical disc degeneration. Other neck: No acute or aggressive finding. Upper chest: Negative Review of the MIP images confirms the above findings CTA HEAD FINDINGS Anterior circulation: Atherosclerotic plaque at the carotid siphons with left more than right stenosis. At least 75% narrowing is seen at the posterior and anterior genu on the left. On the right at the posterior genu there is 60% stenosis. No branch occlusion or beading. Negative for aneurysm. Posterior circulation: No meaningful flow in the left vertebral artery, chronic. The right V4 segment is diffusely patent. Chronic moderate narrowing of the basilar distally. No branch occlusion or beading. Negative for aneurysm Venous sinuses: Patent Anatomic variants: Negative Review of the MIP images confirms the above findings IMPRESSION: 1. Low-density in the ventral pons that is new from January 2021. History suggests acute infarct but this would have an atypical central location. Recommend brain MRI 2. No emergent vascular finding. 3. 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 4. Severe left cavernous ICA stenosis. 60% narrowing of the right cavernous ICA. Electronically Signed   By: Marnee Spring M.D.   On: 10/03/2019 06:12   CT Angio Neck W and/or Wo Contrast  Result Date: 10/03/2019 CLINICAL DATA:  Found unresponsive. EXAM: CT ANGIOGRAPHY HEAD AND NECK TECHNIQUE: Multidetector CT imaging of the head and neck was performed using the standard protocol during bolus administration of intravenous contrast. Multiplanar CT image reconstructions and MIPs were obtained to evaluate the vascular anatomy. Carotid stenosis measurements (when applicable) are obtained utilizing NASCET  criteria, using the distal internal carotid diameter as the denominator. CONTRAST:  40mL OMNIPAQUE IOHEXOL 350 MG/ML SOLN COMPARISON:  09/10/2019 FINDINGS: CT HEAD FINDINGS Brain: Low-density in the ventral midline pons, new. No acute hemorrhage, hydrocephalus, or masslike finding. Vascular: See below Skull: Negative Sinuses: Clear Orbits: Negative Review of the MIP images confirms the above findings CTA NECK FINDINGS Aortic arch: 2 vessel arch.  No acute finding Right carotid system: Atheromatous wall thickening of the common carotid and likely of the bulb. No flow limiting stenosis or ulceration. Left carotid system: Low-density atheromatous wall thickening of the common carotid and likely of the bulb. No ulceration flow limiting stenosis Vertebral arteries: No proximal subclavian stenosis. The non dominant left vertebral artery shows thready flow with no meaningful patency at the level of the dura. Right vertebral artery is smoothly contoured and patent, partially obscured by intravenous contrast at the V1 segment. Skeleton: Cervical disc degeneration. Other neck: No acute or aggressive finding. Upper chest: Negative Review of the MIP images confirms the above findings CTA HEAD FINDINGS Anterior circulation: Atherosclerotic plaque at the carotid siphons with left more than right stenosis. At least 75% narrowing is seen at the posterior and anterior genu on the left. On the right at the posterior genu there is 60% stenosis. No branch occlusion or beading. Negative for aneurysm. Posterior circulation: No meaningful flow in the left vertebral artery, chronic. The right V4 segment is  diffusely patent. Chronic moderate narrowing of the basilar distally. No branch occlusion or beading. Negative for aneurysm Venous sinuses: Patent Anatomic variants: Negative Review of the MIP images confirms the above findings IMPRESSION: 1. Low-density in the ventral pons that is new from January 2021. History suggests acute infarct  but this would have an atypical central location. Recommend brain MRI 2. No emergent vascular finding. 3. 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 4. Severe left cavernous ICA stenosis. 60% narrowing of the right cavernous ICA. Electronically Signed   By: Marnee Spring M.D.   On: 10/03/2019 06:12   MR BRAIN WO CONTRAST  Result Date: 10/03/2019 CLINICAL DATA:  Recurrent falls. Weakness. Abnormality in the pons on CT. EXAM: MRI HEAD WITHOUT CONTRAST TECHNIQUE: Multiplanar, multiecho pulse sequences of the brain and surrounding structures were obtained without intravenous contrast. COMPARISON:  Head CT/CTA 10/03/2019 and MRI 09/11/2019 FINDINGS: Brain: There is a 15 x 12 mm acute infarct in the ventral pons located within and to the left of the midline. Punctate foci of diffusion weighted signal abnormality in the posterior cerebral hemispheres on the prior MRI have resolved. Small foci of T2 hyperintensity in the cerebral white matter bilaterally are unchanged and nonspecific but compatible with mildly age advanced chronic small vessel ischemic disease. A chronic lacunar infarct is again noted in the left thalamus. The ventricles and sulci are within normal limits for age. No intracranial hemorrhage, mass, midline shift, or extra-axial fluid collection is identified. Vascular: Unchanged abnormal appearance of the distal left vertebral artery with only thready intermittent flow shown on CTA. Skull and upper cervical spine: Unremarkable bone marrow signal. Sinuses/Orbits: Unremarkable orbits. Paranasal sinuses and mastoid air cells are clear. Other: None. IMPRESSION: 1. Acute pontine infarct. 2. Mild chronic small vessel ischemic disease. Electronically Signed   By: Sebastian Ache M.D.   On: 10/03/2019 07:54        Scheduled Meds: . aspirin EC  81 mg Oral Daily  . atorvastatin  80 mg Oral q1800  . clopidogrel  75 mg Oral Daily  . enoxaparin (LOVENOX)  injection  40 mg Subcutaneous Q24H  . insulin aspart  0-15 Units Subcutaneous TID WC  . levothyroxine  175 mcg Oral Q0600   Continuous Infusions: . sodium chloride 50 mL/hr at 10/03/19 1644  . sodium chloride       LOS: 1 day   Time spent= 35 mins    Chioke Noxon Joline Maxcy, MD [facility]  If 7PM-7AM, please contact night-coverage  10/04/2019, 8:29 AM

## 2019-10-04 NOTE — Progress Notes (Signed)
OT Cancellation Note  Patient Details Name: AMANDALYNN PITZ MRN: [id number] DOB: 06-Sep-1965   Cancelled Treatment:    Reason Eval/Treat Not Completed: Patient at procedure or test/ unavailable (TEE), will follow up for OT eval as schedule permits.  Marcy Siren, OT [facility] Pager [phone] Office [phone]   Orlando Penner 10/04/2019, 2:28 PM

## 2019-10-04 NOTE — CV Procedure (Signed)
Brief TEE Note  LVEF 60-65% No LA/LAA thrombus or mass No ASD/PFO by color flow Doppler or saline microcavitation study  Normal echo  For additional details see full report.  During this procedure the patient is administered a total of Versed 5 mg and Fentanyl 50 mcg to achieve and maintain moderate conscious sedation.  The patient's heart rate, blood pressure, and oxygen saturation are monitored continuously during the procedure. The period of conscious sedation is 19 minutes, of which I was present face-to-face 100% of this time.   Addy Mcmannis C. Duke Salvia, MD, [facility]  10/04/2019 3:03 PM

## 2019-10-04 NOTE — H&P (Signed)
  Amy Raymond is a 54 y.o. female who has presented today for surgery, with the diagnosis of stroke.  The various methods of treatment have been discussed with the patient and family. After consideration of risks, benefits and other options for treatment, the patient has consented to  Procedure(s): TRANSESOPHAGEAL ECHOCARDIOGRAM (TEE) (N/A) as a surgical intervention .  The patient's history has been reviewed, patient examined, no change in status, stable for surgery.  I have reviewed the patient's chart and labs.  Questions were answered to the patient's satisfaction.    [name] C. Duke Salvia, MD, [facility]  10/04/2019 2:42 PM

## 2019-10-04 NOTE — Progress Notes (Signed)
Visited Ms. Kimaria to complete spiritual consult. Ms. Denzil expressed anxiety about the procedure she will have today at 2pm. She is hoping for "clarification on what is wrong with her". She asked for prayer as well. Will continue to be available for spiritual care as needed.  Rev. Margaretann Loveless Chaplain M. Div.

## 2019-10-04 NOTE — Progress Notes (Signed)
NIR Brief Note:  NIR consulted by Dr. Roda Shutters for possible image-guided diagnostic cerebral arteriogram to evaluate basilar artery stenosis.  Plan for image-guided diagnostic cerebral arteriogram tentatively for tomorrow 10/05/2019 in IR. Patient will be NPO at midnight. Hold Lovenox until post-procedure. Ok to proceed with Plavix/Aspirin use. Patient will be seen/consented for procedure tomorrow prior to procedure.  Please call NIR with questions/concerns.   Waylan Boga Jase Reep, PA-C 10/04/2019, 2:54 PM

## 2019-10-04 NOTE — Evaluation (Signed)
Physical Therapy Evaluation Patient Details Name: Amy Raymond MRN: [id number] DOB: 07-21-1966 Today's Date: 10/04/2019   History of Present Illness   Amy Raymond is a 54 y.o. female with medical history significant of DM; HTN; HLD; and CVA 1/30 presenting with weakness, falls.  She reports that she fell yesterday.  She isn't able to get her balance right, seems to shift side to side with walking.  She was last admitted from 1/30-2/1 for an acute CVA.  She was discharged on ASA and Plavix. MRI revealed acute pontine CVA. Awaiting TEE.   Clinical Impression  Pt admitted with above diagnosis. Pt reports she has not regained energy from her first CVA and now with mild balance impairment and slowed speech. Pt ambulated 350' without AD and no physical assist but mild apraxia noted RLE, especially when descending stairs. Would benefit from outpt PT to return to PLOF. Pt currently with functional limitations due to the deficits listed below (see PT Problem List). Pt will benefit from skilled PT to increase their independence and safety with mobility to allow discharge to the venue listed below.       Follow Up Recommendations Outpatient PT    Equipment Recommendations  None recommended by PT    Recommendations for Other Services       Precautions / Restrictions Precautions Precautions: Fall Precaution Comments: fell before presenting to ED Restrictions Weight Bearing Restrictions: No      Mobility  Bed Mobility Overal bed mobility: Independent                Transfers Overall transfer level: Needs assistance Equipment used: None Transfers: Sit to/from Stand Sit to Stand: Modified independent (Device/Increase time)            Ambulation/Gait Ambulation/Gait assistance: Supervision Gait Distance (Feet): 350 Feet Assistive device: None Gait Pattern/deviations: Step-through pattern Gait velocity: decreased, able to increase with vc's Gait velocity interpretation:  >2.62 ft/sec, indicative of community ambulatory General Gait Details: drift R with occasional misstep of RLE, no LOB though and pt self correcting  Stairs Stairs: Yes Stairs assistance: Supervision Stair Management: Two rails;Forwards;Alternating pattern Number of Stairs: 5 General stair comments: safe on stairs with use of rail but mildly decreased eccentric control of R quad noted on descent  Wheelchair Mobility    Modified Rankin (Stroke Patients Only) Modified Rankin (Stroke Patients Only) Pre-Morbid Rankin Score: Slight disability Modified Rankin: Moderate disability     Balance Overall balance assessment: Needs assistance Sitting-balance support: No upper extremity supported;Feet supported Sitting balance-Leahy Scale: Good     Standing balance support: No upper extremity supported Standing balance-Leahy Scale: Good Standing balance comment: limitations evident, pt able to gain balance but taking increased time and concentration                 Standardized Balance Assessment Standardized Balance Assessment : Berg Balance Test Berg Balance Test Sit to Stand: Able to stand without using hands and stabilize independently Standing Unsupported: Able to stand safely 2 minutes Sitting with Back Unsupported but Feet Supported on Floor or Stool: Able to sit safely and securely 2 minutes Stand to Sit: Sits safely with minimal use of hands Transfers: Able to transfer safely, minor use of hands Standing Unsupported with Eyes Closed: Able to stand 10 seconds safely Standing Ubsupported with Feet Together: Able to place feet together independently and stand for 1 minute with supervision From Standing, Reach Forward with Outstretched Arm: Can reach confidently >25 cm (10") From Standing Position, Pick  up Object from Floor: Able to pick up shoe safely and easily From Standing Position, Turn to Look Behind Over each Shoulder: Looks behind from both sides and weight shifts  well Turn 360 Degrees: Able to turn 360 degrees safely in 4 seconds or less Standing Unsupported, Alternately Place Feet on Step/Stool: Able to stand independently and complete 8 steps >20 seconds Standing Unsupported, One Foot in Front: Able to plae foot ahead of the other independently and hold 30 seconds Standing on One Leg: Able to lift leg independently and hold 5-10 seconds Total Score: 52         Pertinent Vitals/Pain Pain Assessment: No/denies pain    Home Living Family/patient expects to be discharged to:: Private residence Living Arrangements: Alone Available Help at Discharge: Family;Available PRN/intermittently Type of Home: House Home Access: Stairs to enter   [employer] of Steps: 3 Home Layout: One level Home Equipment: None Additional Comments: 2 sons that come get her to go places    Prior Function Level of Independence: Independent         Comments: was drving prior to last CVA, reports that she has not gotten her full energy back since that CVA, has not really wanted to leave her home despite her sons' urgings     Hand Dominance   Dominant Hand: Right    Extremity/Trunk Assessment   Upper Extremity Assessment Upper Extremity Assessment: RUE deficits/detail;Defer to OT evaluation RUE Deficits / Details: coordination deficits noted R hand with mildly decreased grip strength compared to L RUE Sensation: decreased proprioception RUE Coordination: decreased fine motor    Lower Extremity Assessment Lower Extremity Assessment: RLE deficits/detail RLE Deficits / Details: MMT R=L but on ambulation noted RLE apraxia, especially with descending stairs RLE Sensation: decreased proprioception RLE Coordination: decreased gross motor    Cervical / Trunk Assessment Cervical / Trunk Assessment: Normal  Communication   Communication: Expressive difficulties(mild slurring)  Cognition Arousal/Alertness: Awake/alert Behavior During Therapy: WFL for  tasks assessed/performed Overall Cognitive Status: Within Functional Limits for tasks assessed                                        General Comments General comments (skin integrity, edema, etc.): pt able to verbalize stroke sxs. She waited many hours after falling to call 911, discussed importance of time in CVA mgmt    Exercises     Assessment/Plan    PT Assessment Patient needs continued PT services  PT Problem List Decreased balance;Decreased mobility;Decreased coordination       PT Treatment Interventions DME instruction;Gait training;Stair training;Functional mobility training;Therapeutic activities;Therapeutic exercise;Balance training;Patient/family education    PT Goals (Current goals can be found in the Care Plan section)  Acute Rehab PT Goals Patient Stated Goal: home, get energy back PT Goal Formulation: With patient Time For Goal Achievement: 10/18/19 Potential to Achieve Goals: Good Additional Goals Additional Goal #1: Pt to score >19 on DGI    Frequency Min 4X/week   Barriers to discharge Decreased caregiver support lives alone but going home to mothers house    Co-evaluation               AM-PAC PT "6 Clicks" Mobility  Outcome Measure Help needed turning from your back to your side while in a flat bed without using bedrails?: None Help needed moving from lying on your back to sitting on the side of a flat bed  without using bedrails?: None Help needed moving to and from a bed to a chair (including a wheelchair)?: None Help needed standing up from a chair using your arms (e.g., wheelchair or bedside chair)?: None Help needed to walk in hospital room?: A Little Help needed climbing 3-5 steps with a railing? : A Little 6 Click Score: 22    End of Session Equipment Utilized During Treatment: Gait belt Activity Tolerance: Patient tolerated treatment well Patient left: in bed;with call bell/phone within reach Nurse Communication:  Mobility status PT Visit Diagnosis: Other abnormalities of gait and mobility (R26.89)    Time: 2820-6015 PT Time Calculation (min) (ACUTE ONLY): 20 min   Charges:   PT Evaluation $PT Eval Moderate Complexity: 1 Mod          [place], PT  Acute Rehab Services  Pager [phone] Office [phone]   Lawana Chambers Madeleyn Schwimmer 10/04/2019, 2:41 PM

## 2019-10-04 NOTE — Progress Notes (Signed)
STROKE TEAM PROGRESS NOTE   INTERVAL HISTORY Patient lying in bed, not in acute distress.  Waiting for TEE to be done which scheduled around 2:30 PM.  No significant limb weakness, however mild dysmetria on the right upper and lower extremity.  CTA head and neck again demonstrated basal artery stenosis.  Discussed with Dr. Corliss Skains, will do angiogram tomorrow.  Vitals:   10/04/19 0001 10/04/19 0356 10/04/19 0755 10/04/19 1124  BP: (!) 150/65 (!) 154/69 (!) 145/59 (!) 137/57  Pulse: 72 72 71 90  Resp: 17 17 16 16   Temp: 98.4 F (36.9 C) 98.2 F (36.8 C) 98.3 F (36.8 C) 98.3 F (36.8 C)  TempSrc: Oral Oral Oral Oral  SpO2: 100% 100% 100% 100%  Weight:      Height:        CBC:  Recent Labs  Lab 10/03/19 0445  WBC 8.4  HGB 10.1*  HCT 32.2*  MCV 76.7*  PLT 524*    Basic Metabolic Panel:  Recent Labs  Lab 10/03/19 0445  NA 140  K 3.3*  CL 101  CO2 24  GLUCOSE 180*  BUN 9  CREATININE 0.79  CALCIUM 9.3   Lipid Panel:     Component Value Date/Time   CHOL 172 09/12/2019 0211   TRIG 168 (H) 09/12/2019 0211   HDL 21 (L) 09/12/2019 0211   CHOLHDL 8.2 09/12/2019 0211   VLDL 34 09/12/2019 0211   LDLCALC 117 (H) 09/12/2019 0211   HgbA1c:  Lab Results  Component Value Date   HGBA1C 8.7 (H) 09/11/2019   Urine Drug Screen:     Component Value Date/Time   LABOPIA NONE DETECTED 10/03/2019 0814   COCAINSCRNUR NONE DETECTED 10/03/2019 0814   LABBENZ NONE DETECTED 10/03/2019 0814   AMPHETMU NONE DETECTED 10/03/2019 0814   THCU NONE DETECTED 10/03/2019 0814   LABBARB NONE DETECTED 10/03/2019 0814    Alcohol Level     Component Value Date/Time   ETH <10 10/03/2019 0445    IMAGING past 48 hours CT Angio Head W or Wo Contrast  Result Date: 10/03/2019 CLINICAL DATA:  Found unresponsive. EXAM: CT ANGIOGRAPHY HEAD AND NECK TECHNIQUE: Multidetector CT imaging of the head and neck was performed using the standard protocol during bolus administration of intravenous  contrast. Multiplanar CT image reconstructions and MIPs were obtained to evaluate the vascular anatomy. Carotid stenosis measurements (when applicable) are obtained utilizing NASCET criteria, using the distal internal carotid diameter as the denominator. CONTRAST:  52mL OMNIPAQUE IOHEXOL 350 MG/ML SOLN COMPARISON:  09/10/2019 FINDINGS: CT HEAD FINDINGS Brain: Low-density in the ventral midline pons, new. No acute hemorrhage, hydrocephalus, or masslike finding. Vascular: See below Skull: Negative Sinuses: Clear Orbits: Negative Review of the MIP images confirms the above findings CTA NECK FINDINGS Aortic arch: 2 vessel arch.  No acute finding Right carotid system: Atheromatous wall thickening of the common carotid and likely of the bulb. No flow limiting stenosis or ulceration. Left carotid system: Low-density atheromatous wall thickening of the common carotid and likely of the bulb. No ulceration flow limiting stenosis Vertebral arteries: No proximal subclavian stenosis. The non dominant left vertebral artery shows thready flow with no meaningful patency at the level of the dura. Right vertebral artery is smoothly contoured and patent, partially obscured by intravenous contrast at the V1 segment. Skeleton: Cervical disc degeneration. Other neck: No acute or aggressive finding. Upper chest: Negative Review of the MIP images confirms the above findings CTA HEAD FINDINGS Anterior circulation: Atherosclerotic plaque at the carotid siphons  with left more than right stenosis. At least 75% narrowing is seen at the posterior and anterior genu on the left. On the right at the posterior genu there is 60% stenosis. No branch occlusion or beading. Negative for aneurysm. Posterior circulation: No meaningful flow in the left vertebral artery, chronic. The right V4 segment is diffusely patent. Chronic moderate narrowing of the basilar distally. No branch occlusion or beading. Negative for aneurysm Venous sinuses: Patent Anatomic  variants: Negative Review of the MIP images confirms the above findings IMPRESSION: 1. Low-density in the ventral pons that is new from January 2021. History suggests acute infarct but this would have an atypical central location. Recommend brain MRI 2. No emergent vascular finding. 3. 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 4. Severe left cavernous ICA stenosis. 60% narrowing of the right cavernous ICA. Electronically Signed   By: Marnee Spring M.D.   On: 10/03/2019 06:12   CT Angio Neck W and/or Wo Contrast  Result Date: 10/03/2019 CLINICAL DATA:  Found unresponsive. EXAM: CT ANGIOGRAPHY HEAD AND NECK TECHNIQUE: Multidetector CT imaging of the head and neck was performed using the standard protocol during bolus administration of intravenous contrast. Multiplanar CT image reconstructions and MIPs were obtained to evaluate the vascular anatomy. Carotid stenosis measurements (when applicable) are obtained utilizing NASCET criteria, using the distal internal carotid diameter as the denominator. CONTRAST:  50mL OMNIPAQUE IOHEXOL 350 MG/ML SOLN COMPARISON:  09/10/2019 FINDINGS: CT HEAD FINDINGS Brain: Low-density in the ventral midline pons, new. No acute hemorrhage, hydrocephalus, or masslike finding. Vascular: See below Skull: Negative Sinuses: Clear Orbits: Negative Review of the MIP images confirms the above findings CTA NECK FINDINGS Aortic arch: 2 vessel arch.  No acute finding Right carotid system: Atheromatous wall thickening of the common carotid and likely of the bulb. No flow limiting stenosis or ulceration. Left carotid system: Low-density atheromatous wall thickening of the common carotid and likely of the bulb. No ulceration flow limiting stenosis Vertebral arteries: No proximal subclavian stenosis. The non dominant left vertebral artery shows thready flow with no meaningful patency at the level of the dura. Right vertebral artery is smoothly  contoured and patent, partially obscured by intravenous contrast at the V1 segment. Skeleton: Cervical disc degeneration. Other neck: No acute or aggressive finding. Upper chest: Negative Review of the MIP images confirms the above findings CTA HEAD FINDINGS Anterior circulation: Atherosclerotic plaque at the carotid siphons with left more than right stenosis. At least 75% narrowing is seen at the posterior and anterior genu on the left. On the right at the posterior genu there is 60% stenosis. No branch occlusion or beading. Negative for aneurysm. Posterior circulation: No meaningful flow in the left vertebral artery, chronic. The right V4 segment is diffusely patent. Chronic moderate narrowing of the basilar distally. No branch occlusion or beading. Negative for aneurysm Venous sinuses: Patent Anatomic variants: Negative Review of the MIP images confirms the above findings IMPRESSION: 1. Low-density in the ventral pons that is new from January 2021. History suggests acute infarct but this would have an atypical central location. Recommend brain MRI 2. No emergent vascular finding. 3. 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 4. Severe left cavernous ICA stenosis. 60% narrowing of the right cavernous ICA. Electronically Signed   By: Marnee Spring M.D.   On: 10/03/2019 06:12   MR BRAIN WO CONTRAST  Result Date: 10/03/2019 CLINICAL DATA:  Recurrent falls. Weakness. Abnormality  in the pons on CT. EXAM: MRI HEAD WITHOUT CONTRAST TECHNIQUE: Multiplanar, multiecho pulse sequences of the brain and surrounding structures were obtained without intravenous contrast. COMPARISON:  Head CT/CTA 10/03/2019 and MRI 09/11/2019 FINDINGS: Brain: There is a 15 x 12 mm acute infarct in the ventral pons located within and to the left of the midline. Punctate foci of diffusion weighted signal abnormality in the posterior cerebral hemispheres on the prior MRI have resolved. Small  foci of T2 hyperintensity in the cerebral white matter bilaterally are unchanged and nonspecific but compatible with mildly age advanced chronic small vessel ischemic disease. A chronic lacunar infarct is again noted in the left thalamus. The ventricles and sulci are within normal limits for age. No intracranial hemorrhage, mass, midline shift, or extra-axial fluid collection is identified. Vascular: Unchanged abnormal appearance of the distal left vertebral artery with only thready intermittent flow shown on CTA. Skull and upper cervical spine: Unremarkable bone marrow signal. Sinuses/Orbits: Unremarkable orbits. Paranasal sinuses and mastoid air cells are clear. Other: None. IMPRESSION: 1. Acute pontine infarct. 2. Mild chronic small vessel ischemic disease. Electronically Signed   By: Sebastian Ache M.D.   On: 10/03/2019 07:54    PHYSICAL EXAM  Temp:  [96.3 F (35.7 C)-98.4 F (36.9 C)] 98.3 F (36.8 C) (02/23 1559) Pulse Rate:  [63-95] 63 (02/23 1559) Resp:  [16-28] 20 (02/23 1559) BP: (105-195)/(48-90) 151/59 (02/23 1559) SpO2:  [98 %-100 %] 100 % (02/23 1559)  General - Well nourished, well developed, in no apparent distress.  Ophthalmologic - fundi not visualized due to noncooperation.  Cardiovascular - Regular rhythm and rate.  Mental Status -  Level of arousal and orientation to time, place, and person were intact. Language including expression, naming, repetition, comprehension was assessed and found intact. Fund of Knowledge was assessed and was intact.  Cranial Nerves II - XII - II - Visual field intact OU. III, IV, VI - Extraocular movements intact. V - Facial sensation intact bilaterally. VII - Facial movement intact bilaterally. VIII - Hearing & vestibular intact bilaterally. X - Palate elevates symmetrically. XI - Chin turning & shoulder shrug intact bilaterally. XII - Tongue protrusion intact.  Motor Strength - The patient's strength was normal in all extremities and  pronator drift was absent.  Bulk was normal and fasciculations were absent.   Motor Tone - Muscle tone was assessed at the neck and appendages and was normal.  Reflexes - The patient's reflexes were symmetrical in all extremities and she had no pathological reflexes.  Sensory - Light touch, temperature/pinprick were assessed and were symmetrical.    Coordination - mild dysmetria at right FTN and HTS.  Tremor was absent.  Gait and Station - deferred.   ASSESSMENT/PLAN Ms. SHELINA LUO is a 54 y.o. female with history of diabetes, thyroid disease, migraines and admission for cardioembolic infarct in Jan presenting with increasing falls.   Stroke:   pontine infarct in setting of moderate to severe BA stenosis, infarct secondary to large vessel disease source  CTA head & neck new pontine hypodensity from Jan. Chronic BA stenosis w/ thready flow L VA. Severe L cavernous ICA stenosis. R cavernous ICA 60%.   MRI  Acute pontine infarct. Mild small vessel disease.   TEE EF 60 to 65%, no PFO  TCD bubble no window  Recommend 30 day cardioNet monitoring as outpatient to rule out A. fib  LDL 117  HgbA1c 8.7  B12 = 468  Hypercoagulable labs pending   P2Y12 pending in  am  Lovenox 40 mg sq daily for VTE prophylaxis  aspirin 81 mg daily and clopidogrel 75 mg daily prior to admission, now on aspirin 325 mg daily and clopidogrel 75 mg daily.   Therapy recommendations:  pending   Disposition:  pending   Basal artery stenosis  CTA head and neck x2 showing moderate stenosis of mid BA  Current pontine stroke likely due to large vessel disease from VA stenosis  Recommend interventional neuroradiology evaluation  Plan for cerebral angiogram in a.m. with Dr. Estanislado Pandy  Hx Presvious Stroke/TIA  09/11/2019 - episode of aphasia, vision loss and loss of consciousness.  EEG negative.  MRI tiny punctate foci of diffusion abnormality involving the parieto-occipital regions bilaterally as  well as chronic left thalamic lacunar infarct. Unable to get TCD window.  CT head and neck hypoplastic left VA, moderate stenosis of mid BA, multifocal atherosclerosis bilateral carotid siphons. DVT negative.  EF 60 to 65%.  LDL 117 and A1c 8.7.  UDS negative.  Put on aspirin and plavix for 3 months given intracranial atherosclerosis.  On Lipitor 40. Recommend 30-day CardioNet monitoring as outpatient.  Hypertension  Stable . Permissive hypertension (OK if < 220/120) but gradually normalize in 5-7 days . Long-term BP goal normotensive  Hyperlipidemia  Home meds:  lipitor 40  Now on lipitor 80  LDL 117, goal < 70  Continue statin at discharge  Diabetes type II Uncontrolled  HgbA1c 8.7, goal < 7.0  CBGs  SSI  Close PCP follow-up for better DM control  Anemia with reactive thrombocytosis  Hemoglobin 10.7-10.1  Platelet [phone]  Iron deficiency anemia given low MCV and MCH  CBC monitoring  Management as per primary team  Other Stroke Risk Factors  Overweight, Body mass index is 29.14 kg/m., recommend weight loss, diet and exercise as appropriate   Hx stroke/TIA (see above)  Migraines  Other Active Problems  Hypothyroidism   Hospital day # 1  Rosalin Hawking, MD PhD Stroke Neurology 10/04/2019 4:36 PM    To contact Stroke Continuity provider, please refer to [email or web address] After hours, contact General Neurology

## 2019-10-04 NOTE — Progress Notes (Signed)
  Echocardiogram Echocardiogram Transesophageal has been performed.  Delcie Roch 10/04/2019, 3:26 PM

## 2019-10-05 ENCOUNTER — Encounter (HOSPITAL_COMMUNITY): Payer: Self-pay | Admitting: Neuroradiology

## 2019-10-05 ENCOUNTER — Inpatient Hospital Stay (HOSPITAL_COMMUNITY): Payer: BLUE CROSS/BLUE SHIELD

## 2019-10-05 HISTORY — PX: IR US GUIDE VASC ACCESS RIGHT: IMG2390

## 2019-10-05 HISTORY — PX: IR ANGIO INTRA EXTRACRAN SEL INTERNAL CAROTID BILAT MOD SED: IMG5363

## 2019-10-05 HISTORY — PX: IR ANGIO VERTEBRAL SEL VERTEBRAL UNI R MOD SED: IMG5368

## 2019-10-05 LAB — GLUCOSE, CAPILLARY
Glucose-Capillary: 134 mg/dL — ABNORMAL HIGH (ref 70–99)
Glucose-Capillary: 80 mg/dL (ref 70–99)
Glucose-Capillary: 106 mg/dL — ABNORMAL HIGH (ref 70–99)
Glucose-Capillary: 89 mg/dL (ref 70–99)

## 2019-10-05 LAB — FOLATE RBC
Folate, Hemolysate: 297 ng/mL
Folate, RBC: 983 ng/mL (ref >498)
Hematocrit: 30.2 % — ABNORMAL LOW (ref 34.0–46.6)

## 2019-10-05 LAB — T4, FREE: Free T4: 2.64 ng/dL — ABNORMAL HIGH (ref 0.61–1.12)

## 2019-10-05 LAB — BASIC METABOLIC PANEL
Anion gap: 11 (ref 5–15)
BUN: 5 mg/dL — ABNORMAL LOW (ref 6–20)
CO2: 23 mmol/L (ref 22–32)
Calcium: 8.9 mg/dL (ref 8.9–10.3)
Chloride: 106 mmol/L (ref 98–111)
Creatinine, Ser: 0.59 mg/dL (ref 0.44–1.00)
GFR calc Af Amer: >60 mL/min (ref >60)
GFR calc non Af Amer: >60 mL/min (ref >60)
Glucose, Bld: 155 mg/dL — ABNORMAL HIGH (ref 70–99)
Potassium: 3.2 mmol/L — ABNORMAL LOW (ref 3.5–5.1)
Sodium: 140 mmol/L (ref 135–145)

## 2019-10-05 LAB — PLATELET INHIBITION P2Y12: Platelet Function  P2Y12: 184 [PRU] (ref 182–335)

## 2019-10-05 LAB — MAGNESIUM: Magnesium: 1.6 mg/dL — ABNORMAL LOW (ref 1.7–2.4)

## 2019-10-05 MED ORDER — VERAPAMIL HCL 2.5 MG/ML IV SOLN
INTRAVENOUS | Status: AC
  Filled 2019-10-05: qty 2

## 2019-10-05 MED ORDER — MIDAZOLAM HCL 2 MG/2ML IJ SOLN
INTRAMUSCULAR | Status: AC
  Filled 2019-10-05: qty 2

## 2019-10-05 MED ORDER — LIDOCAINE HCL 1 % IJ SOLN
INTRAMUSCULAR | Status: AC
  Filled 2019-10-05: qty 20

## 2019-10-05 MED ORDER — HEPARIN SODIUM (PORCINE) 1000 UNIT/ML IJ SOLN
INTRAMUSCULAR | Status: AC | PRN
  Administered 2019-10-05: 5000 [IU] via INTRA_ARTERIAL

## 2019-10-05 MED ORDER — IOHEXOL 240 MG/ML SOLN
INTRAMUSCULAR | Status: AC
  Administered 2019-10-05: 55 mL via INTRA_ARTERIAL
  Filled 2019-10-05: qty 100

## 2019-10-05 MED ORDER — FENTANYL CITRATE (PF) 100 MCG/2ML IJ SOLN
INTRAMUSCULAR | Status: AC | PRN
  Administered 2019-10-05 (×2): 25 ug via INTRAVENOUS
  Administered 2019-10-05: 50 ug via INTRAVENOUS

## 2019-10-05 MED ORDER — NITROGLYCERIN 1 MG/10 ML FOR IR/CATH LAB
INTRA_ARTERIAL | Status: AC
  Filled 2019-10-05: qty 10

## 2019-10-05 MED ORDER — HEPARIN SODIUM (PORCINE) 1000 UNIT/ML IJ SOLN
INTRAMUSCULAR | Status: AC
  Filled 2019-10-05: qty 1

## 2019-10-05 MED ORDER — NITROGLYCERIN 1 MG/10 ML FOR IR/CATH LAB
INTRA_ARTERIAL | Status: AC | PRN
  Administered 2019-10-05: 300 ug via INTRA_ARTERIAL

## 2019-10-05 MED ORDER — POTASSIUM CHLORIDE CRYS ER 20 MEQ PO TBCR
40.0000 meq | EXTENDED_RELEASE_TABLET | Freq: Once | ORAL | Status: AC
  Administered 2019-10-05: 40 meq via ORAL
  Filled 2019-10-05: qty 2

## 2019-10-05 MED ORDER — MAGNESIUM SULFATE 2 GM/50ML IV SOLN
2.0000 g | Freq: Once | INTRAVENOUS | Status: AC
  Administered 2019-10-05: 2 g via INTRAVENOUS
  Filled 2019-10-05: qty 50

## 2019-10-05 MED ORDER — ACETAMINOPHEN 325 MG PO TABS
650.0000 mg | ORAL_TABLET | Freq: Four times a day (QID) | ORAL | Status: DC | PRN
  Administered 2019-10-05 – 2019-10-06 (×2): 650 mg via ORAL
  Filled 2019-10-05 (×2): qty 2

## 2019-10-05 MED ORDER — FENTANYL CITRATE (PF) 100 MCG/2ML IJ SOLN
INTRAMUSCULAR | Status: AC
  Filled 2019-10-05: qty 2

## 2019-10-05 MED ORDER — VERAPAMIL HCL 2.5 MG/ML IV SOLN
INTRAVENOUS | Status: AC | PRN
  Administered 2019-10-05: 2.5 mg via INTRA_ARTERIAL

## 2019-10-05 MED ORDER — LIDOCAINE-PRILOCAINE 2.5-2.5 % EX CREA
TOPICAL_CREAM | Freq: Once | CUTANEOUS | Status: AC
  Filled 2019-10-05: qty 5

## 2019-10-05 MED ORDER — MIDAZOLAM HCL 2 MG/2ML IJ SOLN
INTRAMUSCULAR | Status: AC | PRN
  Administered 2019-10-05 (×2): 0.5 mg via INTRAVENOUS
  Administered 2019-10-05: 1 mg via INTRAVENOUS

## 2019-10-05 MED ORDER — LEVOTHYROXINE SODIUM 100 MCG PO TABS
100.0000 ug | ORAL_TABLET | Freq: Every day | ORAL | Status: DC
  Administered 2019-10-06: 100 ug via ORAL
  Filled 2019-10-05: qty 1

## 2019-10-05 NOTE — Progress Notes (Signed)
PT Cancellation Note  Patient Details Name: CALIEGH MIDDLEKAUFF MRN: [id number] DOB: 04-16-1966   Cancelled Treatment:     Pt is s/p IR ANGIO EXTERNAL CAROTID SEL EXT CAROTID BILAT MOD SED  Today.  Spoke with RN and pt not allowed to move R UE for 24 hr and is confused after sedation.  Will follow up at later date. Royetta Asal, PT Acute Rehab Services Pager [phone] [facility] [phone] [facility] [phone]    Rayetta Humphrey 10/05/2019, 3:51 PM

## 2019-10-05 NOTE — Progress Notes (Signed)
PROGRESS NOTE    Amy Raymond  EXH:[id number] DOB: [birth year] DOA: 10/03/2019 PCP: [place], Julie M, NP     Brief Narrative:  Amy Raymond is a 54 year old female with past medical history significant for type 2 diabetes, hypertension, hyperlipidemia, previous stroke who presented with weakness and fall.  She was admitted about 3 weeks ago for acute CVA, discharged at that time on aspirin and Plavix.  In the emergency room, MRI revealed acute pontine CVA and CTA head and neck revealed basilar artery stenosis.  Patient was admitted with neurology consult.  New events last 24 hours / Subjective: No acute complaints this morning.  Feeling well overall and denies any chest pain, shortness of breath, nausea or vomiting.  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 Fib -Aspirin, Plavix, Lipitor -IR planning for cerebral arteriogram 2/24  Essential hypertension -Allow permissive hypertension in setting of acute stroke  Hyperlipidemia -Continue Lipitor  Type 2 diabetes, poorly controlled with hyperglycemia -Continue sliding scale insulin  Hypothyroidism -Free T4 elevated 2.64, TSH low 0.087  -Continue Synthroid, reduced dose  Hypokalemia -Replace, trend  Hypomagnesemia -Replace, trend   DVT prophylaxis: Lovenox Code Status: Full code Family Communication: None Disposition Plan:   Patient is from home prior to admission.  Currently in-hospital treatment needed  due to IR procedure planned for today.  Suspect patient will discharge home in 1 to 2 days.   Consultants:   Neurology  IR   Antimicrobials:  Anti-infectives (From admission, onward)   None        Objective: Vitals:   10/05/19 1320 10/05/19 1325 10/05/19 1330 10/05/19 1335  BP: 114/65 124/70 125/67 125/66  Pulse: 87 84 78 76  Resp: (!) 21 (!) 21 18 19   Temp:      TempSrc:      SpO2: 100% 100% 100% 100%  Weight:      Height:        Intake/Output Summary (Last 24 hours) at 10/05/2019 1336 Last data filed at 10/05/2019 0800 Gross per 24 hour  Intake 240 ml  Output --  Net 240 ml   Filed Weights   10/03/19 0435  Weight: 77 kg    Examination:  General exam: Appears calm and comfortable  Respiratory system: Clear to auscultation. Respiratory effort normal. No respiratory distress. No conversational dyspnea.  Cardiovascular system: S1 & S2 heard, RRR. No murmurs. No pedal edema. Gastrointestinal system: Abdomen is nondistended, soft and nontender. Normal bowel sounds heard. Central nervous system: Alert and oriented. No focal neurological deficits. Speech clear.  Extremities: Symmetric in appearance  Skin: No rashes, lesions or ulcers on exposed skin  Psychiatry: Judgement and insight appear normal. Mood & affect appropriate.   Data Reviewed: I have personally reviewed following labs and imaging studies  CBC: Recent Labs  Lab 10/03/19 0445  WBC 8.4  HGB 10.1*  HCT 32.2*  MCV 76.7*  PLT 524*   Basic Metabolic Panel: Recent Labs  Lab 10/03/19 0445 10/05/19 0827  NA 140 140  K 3.3* 3.2*  CL 101 106  CO2 24 23  GLUCOSE 180* 155*  BUN 9 5*  CREATININE 0.79 0.59  CALCIUM 9.3 8.9  MG  --  1.6*   GFR: Estimated Creatinine Clearance: 79.8 mL/min (by C-G formula based on SCr of 0.59 mg/dL). Liver Function Tests: Recent Labs  Lab 10/03/19 0445  AST 23  ALT 21  ALKPHOS 137*  BILITOT 0.6  PROT 7.5  ALBUMIN 3.1*   No results for input(s):  LIPASE, AMYLASE in the last 168 hours. No results for input(s): AMMONIA in the last 168 hours. Coagulation Profile: Recent Labs  Lab 10/03/19 0921  INR 1.1   Cardiac Enzymes: No results for input(s): CKTOTAL, CKMB, CKMBINDEX, TROPONINI in the last 168 hours. BNP (last 3 results) No results for input(s): PROBNP in the last 8760 hours. HbA1C: No results for input(s): HGBA1C in the last 72 hours. CBG: Recent Labs  Lab 10/04/19 0615 10/04/19 1124 10/04/19 1604 10/05/19 0824 10/05/19 1143  GLUCAP 147* 107* 83 134* 80   Lipid Profile: No results for input(s): CHOL, HDL, LDLCALC, TRIG, CHOLHDL, LDLDIRECT in the last 72 hours. Thyroid Function Tests: Recent Labs    10/04/19 0338 10/05/19 0827  TSH 0.087*  --   FREET4  --  2.64*   Anemia Panel: Recent Labs    10/04/19 0846  VITAMINB12 468  TIBC 196*  IRON 32   Sepsis Labs: No results for input(s): PROCALCITON, LATICACIDVEN in the last 168 hours.  Recent Results (from the past 240 hour(s))  SARS CORONAVIRUS 2 (TAT 6-24 HRS) Nasopharyngeal Nasopharyngeal Swab     Status: None   Collection Time: 10/03/19  9:45 AM   Specimen: Nasopharyngeal Swab  Result Value Ref Range Status   SARS Coronavirus 2 NEGATIVE NEGATIVE Final    Comment: (NOTE) SARS-CoV-2 target nucleic acids are NOT DETECTED. The SARS-CoV-2 RNA is generally detectable in upper and lower respiratory specimens during the acute phase of infection. Negative results do not preclude SARS-CoV-2 infection, do not rule out co-infections with other pathogens, and should not be used as the sole basis for treatment or other patient management decisions. Negative results must be combined with clinical observations, patient history, and epidemiological information. The expected result is Negative. Fact Sheet for Patients: HairSlick.no Fact Sheet for Healthcare Providers: quierodirigir.com This test is not yet  approved or cleared by the [place] FDA and  has been authorized for detection and/or diagnosis of SARS-CoV-2 by FDA under an Emergency Use Authorization (EUA). This EUA will remain  in effect (meaning this test can be used) for the duration of the COVID-19 declaration under Section 56 4(b)(1) of the Act, 21 U.S.C. section 360bbb-3(b)(1), unless the authorization is terminated or revoked sooner. Performed at [facility], [address], [place], [place] [address]       Radiology Studies: ECHO TEE  Result Date: 10/04/2019    TRANSESOPHOGEAL ECHO REPORT   Patient Name:   AVANTHIKA DEHNERT Date of Exam: 10/04/2019 Medical Rec #:  [id number]       Height:       64.0 in Accession #:    [id number]      Weight:       169.8 lb Date of Birth:  12/30/1965       BSA:          1.825 m Patient Age:    53 years        BP:           188/62 mmHg Patient Gender: F  HR:           87 bpm. Exam Location:  Inpatient Procedure: 2D Echo, Transesophageal Echo and Saline Contrast Bubble Study Indications:     stroke  History:         Patient has prior history of Echocardiogram examinations, most                  recent 09/11/2019.  Sonographer:     Johny Chess Referring Phys:  [id number] Leanor Kail Diagnosing Phys: Skeet Latch MD PROCEDURE: The transesophogeal probe was passed without difficulty through the esophogus of the patient. Local oropharyngeal anesthetic was provided with Cetacaine. Sedation performed by performing physician. Patients was under conscious sedation during this procedure. Anesthetic administered: 12mcg of Fentanyl, 5.0mg  of Versed. The patient's vital signs; including heart rate, blood pressure, and oxygen saturation; remained stable throughout the procedure. The patient developed no complications during the procedure. IMPRESSIONS  1. Left ventricular ejection fraction, by estimation, is 60 to 65%. The left ventricle has normal function. The left ventricle has no  regional wall motion abnormalities. Left ventricular diastolic function could not be evaluated.  2. Right ventricular systolic function is normal. The right ventricular size is normal. There is normal pulmonary artery systolic pressure.  3. No left atrial/left atrial appendage thrombus was detected.  4. The mitral valve is normal in structure and function. Trivial mitral valve regurgitation. No evidence of mitral stenosis.  5. The aortic valve is tricuspid. Aortic valve regurgitation is trivial. No aortic stenosis is present.  6. The inferior vena cava is normal in size with greater than 50% respiratory variability, suggesting right atrial pressure of 3 mmHg.  7. Agitated saline contrast bubble study was negative, with no evidence of any interatrial shunt. Conclusion(s)/Recommendation(s): Normal biventricular function without evidence of hemodynamically significant valvular heart disease. FINDINGS  Left Ventricle: Left ventricular ejection fraction, by estimation, is 60 to 65%. The left ventricle has normal function. The left ventricle has no regional wall motion abnormalities. The left ventricular internal cavity size was normal in size. There is  no left ventricular hypertrophy. Right Ventricle: The right ventricular size is normal. No increase in right ventricular wall thickness. Right ventricular systolic function is normal. There is normal pulmonary artery systolic pressure. The tricuspid regurgitant velocity is 1.91 m/s, and  with an assumed right atrial pressure of 0 mmHg, the estimated right ventricular systolic pressure is 45.4 mmHg. Left Atrium: Left atrial size was normal in size. No left atrial/left atrial appendage thrombus was detected. Right Atrium: Right atrial size was normal in size. Pericardium: There is no evidence of pericardial effusion. Mitral Valve: The mitral valve is normal in structure and function. Normal mobility of the mitral valve leaflets. Trivial mitral valve regurgitation. No  evidence of mitral valve stenosis. Tricuspid Valve: The tricuspid valve is normal in structure. Tricuspid valve regurgitation is mild . No evidence of tricuspid stenosis. Aortic Valve: The aortic valve is tricuspid. Aortic valve regurgitation is trivial. No aortic stenosis is present. Pulmonic Valve: The pulmonic valve was normal in structure. Pulmonic valve regurgitation is not visualized. No evidence of pulmonic stenosis. Aorta: The aortic root is normal in size and structure. Venous: The inferior vena cava is normal in size with greater than 50% respiratory variability, suggesting right atrial pressure of 3 mmHg. IAS/Shunts: No atrial level shunt detected by color flow Doppler. Agitated saline contrast was given intravenously to evaluate for intracardiac shunting. Agitated saline contrast bubble study was negative, with no evidence of any interatrial shunt.  There  is no evidence of a patent foramen ovale. There is no evidence of an atrial septal defect.  TRICUSPID VALVE TR Peak grad:   14.6 mmHg TR Vmax:        191.00 cm/s Chilton Si MD Electronically signed by Chilton Si MD Signature Date/Time: 10/04/2019/4:03:07 PM    Final       Scheduled Meds:  aspirin EC  325 mg Oral Daily   atorvastatin  80 mg Oral q1800   clopidogrel  75 mg Oral Daily   [START ON 10/06/2019] enoxaparin (LOVENOX) injection  40 mg Subcutaneous Q24H   feeding supplement (GLUCERNA SHAKE)  237 mL Oral TID BM   insulin aspart  0-15 Units Subcutaneous TID WC   iohexol       levothyroxine  175 mcg Oral Q0600   lidocaine       multivitamin with minerals  1 tablet Oral Daily   Continuous Infusions:  sodium chloride 75 mL/hr at 10/04/19 1114     LOS: 2 days      Time spent: 35 minutes   Noralee Stain, DO [facility] 10/05/2019, 1:36 PM   Available via Epic secure chat 7am-7pm After these hours, please refer to coverage provider listed on [email or web address]

## 2019-10-05 NOTE — Progress Notes (Signed)
SLP Cancellation Note  Patient Details Name: Amy Raymond MRN: [id number] DOB: Jan 08, 1966   Cancelled treatment:       Reason Eval/Treat Not Completed: Fatigue/lethargy limiting ability to participate Patient is recovering from procedure. Per Nurse, she is confused and fatigued following sedation. ST will continue efforts.   Shella Spearing, M.Ed., CCC-SLP Speech Therapy Acute Rehabilitation [phone]: Acute Rehab office [phone] - pager    Shella Spearing 10/05/2019, 4:44 PM

## 2019-10-05 NOTE — Progress Notes (Signed)
I concur with the assessment and medication administration (8:00 a.m. to 5:00 p.m.) implemented by Danielle Rankin, SN.

## 2019-10-05 NOTE — Evaluation (Signed)
Occupational Therapy Evaluation Patient Details Name: Amy Raymond MRN: [id number] DOB: 17-Aug-1965 Today's Date: 10/05/2019    History of Present Illness  Amy Raymond is a 54 y.o. female with medical history significant of DM; HTN; HLD; and CVA 1/30 presenting with weakness, falls.  She reports that she fell yesterday.  She isn't able to get her balance right, seems to shift side to side with walking.  She was last admitted from 1/30-2/1 for an acute CVA.  She was discharged on ASA and Plavix. MRI revealed acute pontine CVA. Awaiting TEE.    Clinical Impression   This 54 y/o female presents with the above. PTA pt reports independence with ADL and mobility, though reports feeling increasingly weak since recent admit and discharge home. Today pt able to perform room level mobility without AD and ADL tasks at overall supervision level. Pt also presenting with decreased fine motor coordination/strength in dominant RUE. Issued fine motor/coordination HEP and reviewed during session. Pt will benefit from continued acute OT services and recommend follow up neuro outpt therapy services to further address RUE deficits and to maximize her overall safety and independence with ADL/mobility. Will follow.     Follow Up Recommendations  Outpatient OT;Supervision - Intermittent(neuro outpt)    Equipment Recommendations  None recommended by OT           Precautions / Restrictions Precautions Precautions: Fall Precaution Comments: fell before presenting to ED Restrictions Weight Bearing Restrictions: No      Mobility Bed Mobility Overal bed mobility: Independent                Transfers Overall transfer level: Needs assistance Equipment used: None Transfers: Sit to/from Stand Sit to Stand: Modified independent (Device/Increase time)              Balance Overall balance assessment: Needs assistance Sitting-balance support: No upper extremity supported;Feet supported Sitting  balance-Leahy Scale: Good     Standing balance support: No upper extremity supported Standing balance-Leahy Scale: Good                             ADL either performed or assessed with clinical judgement   ADL Overall ADL's : Needs assistance/impaired Eating/Feeding: NPO Eating/Feeding Details (indicate cue type and reason): NPO today for procedure Grooming: Oral care;Standing;Wash/dry face;Supervision/safety   Upper Body Bathing: Modified independent;Sitting   Lower Body Bathing: Supervison/ safety;Sit to/from stand   Upper Body Dressing : Modified independent;Sitting   Lower Body Dressing: Supervision/safety;Sit to/from stand   Toilet Transfer: Supervision/safety;Ambulation Toilet Transfer Details (indicate cue type and reason): simulated via room level mobility, transfer to/from [place] and Hygiene: Supervision/safety;Sit to/from stand       Functional mobility during ADLs: Supervision/safety       Vision Patient Visual Report: No change from baseline Vision Assessment?: No apparent visual deficits     Perception     Praxis      Pertinent Vitals/Pain Pain Assessment: No/denies pain     Hand Dominance Right   Extremity/Trunk Assessment Upper Extremity Assessment Upper Extremity Assessment: RUE deficits/detail RUE Deficits / Details: coordination deficits noted R hand with mildly decreased grip and strength compared to L RUE Sensation: decreased proprioception RUE Coordination: decreased fine motor   Lower Extremity Assessment Lower Extremity Assessment: Defer to PT evaluation       Communication Communication Communication: Expressive difficulties(mild slurring)   Cognition Arousal/Alertness: Awake/alert Behavior During Therapy: WFL for tasks assessed/performed  Overall Cognitive Status: Within Functional Limits for tasks assessed                                     General Comments        Exercises Exercises: Other exercises Other Exercises Other Exercises: issued fine motor/coordination HEP and reviewed with pt    Shoulder Instructions      Home Living Family/patient expects to be discharged to:: Private residence Living Arrangements: Alone Available Help at Discharge: Family;Available PRN/intermittently Type of Home: House Home Access: Stairs to enter [employer] of Steps: 3   Home Layout: One level     Bathroom Shower/Tub: Chief Strategy Officer: Standard     Home Equipment: None   Additional Comments: 2 sons that come get her to go places      Prior Functioning/Environment Level of Independence: Independent        Comments: was drving prior to last CVA, reports that she has not gotten her full energy back since that CVA, has not really wanted to leave her home despite her sons' urgings        OT Problem List: Impaired UE functional use;Decreased coordination;Decreased strength      OT Treatment/Interventions: Self-care/ADL training;Neuromuscular education;DME and/or AE instruction;Energy conservation;Therapeutic activities;Patient/family education;Balance training;Therapeutic exercise    OT Goals(Current goals can be found in the care plan section) Acute Rehab OT Goals Patient Stated Goal: home, get energy back OT Goal Formulation: With patient Time For Goal Achievement: 10/19/19 Potential to Achieve Goals: Good  OT Frequency: Min 2X/week   Barriers to D/C:            Co-evaluation              AM-PAC OT "6 Clicks" Daily Activity     Outcome Measure Help from another person eating meals?: None Help from another person taking care of personal grooming?: None Help from another person toileting, which includes using toliet, bedpan, or urinal?: None Help from another person bathing (including washing, rinsing, drying)?: None Help from another person to put on and taking off regular upper body clothing?:  None Help from another person to put on and taking off regular lower body clothing?: None 6 Click Score: 24   End of Session Nurse Communication: Mobility status  Activity Tolerance: Patient tolerated treatment well Patient left: with call bell/phone within reach;Other (comment)(seated EOB with nursing student/RN)  OT Visit Diagnosis: Other symptoms and signs involving the nervous system (F68.127)                Time: 5170-0174 OT Time Calculation (min): 19 min Charges:  OT General Charges $OT Visit: 1 Visit OT Evaluation $OT Eval Moderate Complexity: 1 Mod  Marcy Siren, OT [employer] Pager [phone] Office [phone]   Orlando Penner 10/05/2019, 2:46 PM

## 2019-10-05 NOTE — Sedation Documentation (Signed)
Pt states she is nervous and believes that may be the cause of her elevated BP.

## 2019-10-05 NOTE — Sedation Documentation (Signed)
TR band placed with 26ml of air.

## 2019-10-05 NOTE — Progress Notes (Signed)
STROKE TEAM PROGRESS NOTE   INTERVAL HISTORY Patient lying in bed, RN at bedside.  RN the patient reported some amnesia episode post procedure and anesthesia.  Currently at baseline.  Cerebral angiogram showed mild basal artery stenosis and left siphon 50% stenosis.  Vitals:   10/05/19 1340 10/05/19 1345 10/05/19 1350 10/05/19 1356  BP: 137/68 139/63 136/67 129/70  Pulse: 76 74 77 73  Resp: 18 18 20 17   Temp:      TempSrc:      SpO2: 100% 100% 100% 100%  Weight:      Height:        CBC:  Recent Labs  Lab 10/03/19 0445  WBC 8.4  HGB 10.1*  HCT 32.2*  MCV 76.7*  PLT 524*    Basic Metabolic Panel:  Recent Labs  Lab 10/03/19 0445 10/05/19 0827  NA 140 140  K 3.3* 3.2*  CL 101 106  CO2 24 23  GLUCOSE 180* 155*  BUN 9 5*  CREATININE 0.79 0.59  CALCIUM 9.3 8.9  MG  --  1.6*   Lipid Panel:     Component Value Date/Time   CHOL 172 09/12/2019 0211   TRIG 168 (H) 09/12/2019 0211   HDL 21 (L) 09/12/2019 0211   CHOLHDL 8.2 09/12/2019 0211   VLDL 34 09/12/2019 0211   LDLCALC 117 (H) 09/12/2019 0211   HgbA1c:  Lab Results  Component Value Date   HGBA1C 8.7 (H) 09/11/2019   Urine Drug Screen:     Component Value Date/Time   LABOPIA NONE DETECTED 10/03/2019 0814   COCAINSCRNUR NONE DETECTED 10/03/2019 0814   LABBENZ NONE DETECTED 10/03/2019 0814   AMPHETMU NONE DETECTED 10/03/2019 0814   THCU NONE DETECTED 10/03/2019 0814   LABBARB NONE DETECTED 10/03/2019 0814    Alcohol Level     Component Value Date/Time   ETH <10 10/03/2019 0445    IMAGING past 48 hours IR 10/05/2019 Guide Vasc Access Right  Result Date: 10/05/2019 de 10/07/2019, MD     10/05/2019  2:39 PM Diagnostic cerebral angiogram Operator: Dr. 10/07/2019 Indication: Basilar artery stenosis Access: Right distal radial artery Sedation: 100 mcg fentanyl; 2 mg midazolan Contrast: 55 mL Omnipaque 240 Findings: There is mild stenosis of the mid basilar artery with no flow  limitation. Atherosclerotic changes of the bilateral intracranial ICAs with approximately 54% stenosis at the paraclinoid level on the left. No significant stenosis on the right. Blood loss: minimal. Complications: None. Patient tolerated the procedure well and is stable.   ECHO TEE  Result Date: 10/04/2019    TRANSESOPHOGEAL ECHO REPORT   Patient Name:   GEORGIANNA BAND Date of Exam: 10/04/2019 Medical Rec #:  10/06/2019       Height:       64.0 in Accession #:    [id number]      Weight:       169.8 lb Date of Birth:  06-20-1966       BSA:          1.825 m Patient Age:    54 years        BP:           188/62 mmHg Patient Gender: F               HR:           87 bpm. Exam Location:  Inpatient Procedure: 2D Echo, Transesophageal Echo and Saline Contrast Bubble Study Indications:     stroke  History:         Patient has prior history of Echocardiogram examinations, most                  recent 09/11/2019.  Sonographer:     Delcie Roch Referring Phys:  [id number] Manson Passey Diagnosing Phys: Chilton Si MD PROCEDURE: The transesophogeal probe was passed without difficulty through the esophogus of the patient. Local oropharyngeal anesthetic was provided with Cetacaine. Sedation performed by performing physician. Patients was under conscious sedation during this procedure. Anesthetic administered: of Fentanyl, 5.0mg  of Versed. The patient's vital signs; including heart rate, blood pressure, and oxygen saturation; remained stable throughout the procedure. The patient developed no complications during the procedure. IMPRESSIONS  1. Left ventricular ejection fraction, by estimation, is 60 to 65%. The left ventricle has normal function. The left ventricle has no regional wall motion abnormalities. Left ventricular diastolic function could not be evaluated.  2. Right ventricular systolic function is normal. The right ventricular size is normal. There is normal pulmonary artery systolic pressure.  3. No  left atrial/left atrial appendage thrombus was detected.  4. The mitral valve is normal in structure and function. Trivial mitral valve regurgitation. No evidence of mitral stenosis.  5. The aortic valve is tricuspid. Aortic valve regurgitation is trivial. No aortic stenosis is present.  6. The inferior vena cava is normal in size with greater than 50% respiratory variability, suggesting right atrial pressure of 3 mmHg.  7. Agitated saline contrast bubble study was negative, with no evidence of any interatrial shunt. Conclusion(s)/Recommendation(s): Normal biventricular function without evidence of hemodynamically significant valvular heart disease. FINDINGS  Left Ventricle: Left ventricular ejection fraction, by estimation, is 60 to 65%. The left ventricle has normal function. The left ventricle has no regional wall motion abnormalities. The left ventricular internal cavity size was normal in size. There is  no left ventricular hypertrophy. Right Ventricle: The right ventricular size is normal. No increase in right ventricular wall thickness. Right ventricular systolic function is normal. There is normal pulmonary artery systolic pressure. The tricuspid regurgitant velocity is 1.91 m/s, and  with an assumed right atrial pressure of 0 mmHg, the estimated right ventricular systolic pressure is 14.6 mmHg. Left Atrium: Left atrial size was normal in size. No left atrial/left atrial appendage thrombus was detected. Right Atrium: Right atrial size was normal in size. Pericardium: There is no evidence of pericardial effusion. Mitral Valve: The mitral valve is normal in structure and function. Normal mobility of the mitral valve leaflets. Trivial mitral valve regurgitation. No evidence of mitral valve stenosis. Tricuspid Valve: The tricuspid valve is normal in structure. Tricuspid valve regurgitation is mild . No evidence of tricuspid stenosis. Aortic Valve: The aortic valve is tricuspid. Aortic valve regurgitation is  trivial. No aortic stenosis is present. Pulmonic Valve: The pulmonic valve was normal in structure. Pulmonic valve regurgitation is not visualized. No evidence of pulmonic stenosis. Aorta: The aortic root is normal in size and structure. Venous: The inferior vena cava is normal in size with greater than 50% respiratory variability, suggesting right atrial pressure of 3 mmHg. IAS/Shunts: No atrial level shunt detected by color flow Doppler. Agitated saline contrast was given intravenously to evaluate for intracardiac shunting. Agitated saline contrast bubble study was negative, with no evidence of any interatrial shunt. There  is no evidence of a patent foramen ovale. There is no evidence of an atrial septal defect.  TRICUSPID VALVE TR Peak grad:   14.6 mmHg TR Vmax:  191.00 cm/s Chilton Si MD Electronically signed by Chilton Si MD Signature Date/Time: 10/04/2019/4:03:07 PM    Final    IR ANGIO INTRA EXTRACRAN SEL INTERNAL CAROTID BILAT MOD SED  Result Date: 10/05/2019 de Glori Luis, MD     10/05/2019  2:39 PM Diagnostic cerebral angiogram Operator: Dr. Baldemar Lenis Indication: Basilar artery stenosis Access: Right distal radial artery Sedation: 100 mcg fentanyl; 2 mg midazolan Contrast: 55 mL Omnipaque 240 Findings: There is mild stenosis of the mid basilar artery with no flow limitation. Atherosclerotic changes of the bilateral intracranial ICAs with approximately 54% stenosis at the paraclinoid level on the left. No significant stenosis on the right. Blood loss: minimal. Complications: None. Patient tolerated the procedure well and is stable.   IR ANGIO VERTEBRAL SEL VERTEBRAL UNI R MOD SED  Result Date: 10/05/2019 de Glori Luis, MD     10/05/2019  2:39 PM Diagnostic cerebral angiogram Operator: Dr. Baldemar Lenis Indication: Basilar artery stenosis Access: Right distal radial artery Sedation: 100 mcg fentanyl; 2 mg midazolan  Contrast: 55 mL Omnipaque 240 Findings: There is mild stenosis of the mid basilar artery with no flow limitation. Atherosclerotic changes of the bilateral intracranial ICAs with approximately 54% stenosis at the paraclinoid level on the left. No significant stenosis on the right. Blood loss: minimal. Complications: None. Patient tolerated the procedure well and is stable.    PHYSICAL EXAM  Temp:  [98.3 F (36.8 C)-98.5 F (36.9 C)] 98.5 F (36.9 C) (02/24 0733) Pulse Rate:  [61-95] 73 (02/24 1356) Resp:  [15-26] 17 (02/24 1356) BP: (105-190)/(48-90) 129/70 (02/24 1356) SpO2:  [97 %-100 %] 100 % (02/24 1356)  General - Well nourished, well developed, in no apparent distress.  Ophthalmologic - fundi not visualized due to noncooperation.  Cardiovascular - Regular rhythm and rate.  Mental Status -  Level of arousal and orientation to time, place, and person were intact. Language including expression, naming, repetition, comprehension was assessed and found intact. Fund of Knowledge was assessed and was intact.  Cranial Nerves II - XII - II - Visual field intact OU. III, IV, VI - Extraocular movements intact. V - Facial sensation intact bilaterally. VII - Facial movement intact bilaterally. VIII - Hearing & vestibular intact bilaterally. X - Palate elevates symmetrically. XI - Chin turning & shoulder shrug intact bilaterally. XII - Tongue protrusion intact.  Motor Strength - The patient's strength was normal in all extremities and pronator drift was absent.  Bulk was normal and fasciculations were absent.   Motor Tone - Muscle tone was assessed at the neck and appendages and was normal.  Reflexes - The patient's reflexes were symmetrical in all extremities and she had no pathological reflexes.  Sensory - Light touch, temperature/pinprick were assessed and were symmetrical.    Coordination - mild dysmetria at right FTN and HTS.  Tremor was absent.  Gait and Station -  deferred.   ASSESSMENT/PLAN Ms. Amy Raymond is a 54 y.o. female with history of diabetes, thyroid disease, migraines and admission for cardioembolic infarct in Jan presenting with increasing falls.   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 Chronic BA stenosis w/ thready flow L VA. Severe L cavernous ICA stenosis. R cavernous ICA 60%.   MRI  Acute pontine infarct. Mild small vessel disease.   TEE EF 60 to 65%, no  PFO  TCD bubble no window  Recommend 30 day cardioNet monitoring as outpatient to rule out A. fib  LDL 117  HgbA1c 8.7  B12 = 468  Hypercoagulable labs pending   P2Y12 184  Lovenox 40 mg sq daily for VTE prophylaxis  aspirin 81 mg daily and clopidogrel 75 mg daily prior to admission, now on aspirin 325 mg daily and clopidogrel 75 mg daily.   Therapy recommendations:  OP OT, OP PT  Disposition:  pending   Basal artery stenosis  CTA head and neck x2 showing moderate stenosis of mid BA  Current pontine stroke likely due to large vessel disease from [place] stenosis  Cerebral angiogram mild BA stenosis w/ flow. L paraclinoid 54% stenosis.  No intervention needed at this time, continue medical management  Hx Presvious Stroke/TIA  09/11/2019 - episode of aphasia, vision loss and loss of consciousness.  EEG negative.  MRI tiny punctate foci of diffusion abnormality involving the parieto-occipital regions bilaterally as well as chronic left thalamic lacunar infarct. Unable to get TCD window.  CT head and neck hypoplastic left VA, moderate stenosis of mid BA, multifocal atherosclerosis bilateral carotid siphons. DVT negative.  EF 60 to 65%.  LDL 117 and A1c 8.7.  UDS negative.  Put on aspirin and plavix for 3 months given intracranial atherosclerosis.  On Lipitor 40. Recommend 30-day CardioNet monitoring as  outpatient.  Hypertension  Stable . Gradually normalize in 2-3 days . Long-term BP goal normotensive  Hyperlipidemia  Home meds:  lipitor 40  Now on lipitor 80  LDL 117, goal < 70  Continue statin at discharge  Diabetes type II Uncontrolled  HgbA1c 8.7, goal < 7.0  CBGs  SSI  Close PCP follow-up for better DM control  Anemia with reactive thrombocytosis  Hemoglobin 10.7-10.1  Platelet [phone]  Iron deficiency anemia given low MCV and MCH  CBC monitoring  Management as per primary team  Other Stroke Risk Factors  Overweight, Body mass index is 29.14 kg/m., recommend weight loss, diet and exercise as appropriate   Hx stroke/TIA (see above)  Migraines  Other Active Problems  Hypothyroidism. TSH 0.087. free T4 2.64  Hypokalemia potassium 3.2  Hypomagnesemia   Hospital day # 2  Neurology will sign off. Please call with questions. Pt will follow up with stroke clinic NP at [facility] in about 4 weeks. Thanks for the consult.  Rosalin Hawking, MD PhD Stroke Neurology 10/05/2019 2:47 PM    To contact Stroke Continuity provider, please refer to [email or web address] After hours, contact General Neurology

## 2019-10-05 NOTE — Consult Note (Signed)
Chief Complaint: Patient was seen in consultation today for basilar artery stenosis/diagnostic cerebral arteriogram.  Referring Physician(s): Marvel Plan  Supervising Physician: Baldemar Lenis  Patient Status: [facility] - In-pt  History of Present Illness: Amy Raymond is a 54 y.o. 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 Of note, she was recently admitted to [facility] from 09/10/2019 to 09/12/2019 for management of acute CVA (tiny punctuate foci of diffusion abnormality involving the parieto-occipital regions bilaterally). She was discharged home in stable condition 09/12/2019 on DAPT (Plavix and Aspirin). She presented to [facility] ED 10/03/2019 with complaints of generalized weakness and falls, with concern that she was having another CVA. In ED, MR brain revealed an acute pontine infarct, and CTA head/neck revealed basilar artery stenosis. She was admitted for further management. Neurology was consulted who recommended NIR consult for possible diagnostic cerebral arteriogram to evaluate basilar artery stenosis.  CTA head/neck 10/03/2019: 1. Low-density in the ventral pons that is new from January 2021. History suggests acute infarct but this would have an atypical central location. Recommend brain MRI 2. No emergent vascular finding. 3. 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 4. Severe left cavernous ICA stenosis. 60% narrowing of the right cavernous ICA.  MR brain 10/03/2019: 1. Acute pontine infarct. 2. Mild chronic small vessel ischemic disease.  NIR consulted by Dr. Roda Shutters for possible image-guided diagnostic cerebral arteriogram. Patient awake and alert laying in bed with no complaints at this time. States that her "off balance issues" have resolved at this time. Denies fever, chills, chest pain, dyspnea, abdominal pain, or headache.  Currently  taking Plavix 75 mg once daily and Aspirin 325 mg once daily.   Past Medical History:  Diagnosis Date  . CVA (cerebral vascular accident) (HCC)    09/11/19; 10/03/19  . Diabetes mellitus without complication (HCC)   . Dyslipidemia   . Hypertension   . Hypothyroidism (acquired)     Past Surgical History:  Procedure Laterality Date  . TUBAL LIGATION      Allergies: Patient has no known allergies.  Medications: Prior to Admission medications   Medication Sig Start Date End Date Taking? Authorizing Provider  aspirin 81 MG EC tablet Take 1 tablet (81 mg total) by mouth daily. Take aspirin with Plavix for 3 months.  Then stop the aspirin and continue Plavix indefinitely. 09/13/19  Yes Alwyn Ren, MD  atorvastatin (LIPITOR) 40 MG tablet Take 1 tablet (40 mg total) by mouth daily at 6 PM. 09/12/19  Yes Alwyn Ren, MD  cholecalciferol (VITAMIN D3) 25 MCG (1000 UNIT) tablet Take 1,000 Units by mouth daily.   Yes [provider]  clopidogrel (PLAVIX) 75 MG tablet Take 1 tablet (75 mg total) by mouth daily. 09/13/19  Yes Alwyn Ren, MD  ferrous sulfate 325 (65 FE) MG tablet Take 325 mg by mouth daily with breakfast.   Yes [provider]  glipiZIDE (GLUCOTROL) 10 MG tablet Take 10 mg by mouth 2 (two) times daily. 08/22/19  Yes [provider]  levothyroxine (SYNTHROID) 175 MCG tablet Take 1 tablet (175 mcg total) by mouth daily. 09/13/19  Yes Alwyn Ren, MD  Linagliptin-Metformin HCl (JENTADUETO) 2.12-998 MG TABS Take 1 tablet by mouth 2 (two) times daily.     Yes [provider]  lisinopril (ZESTRIL) 10 MG tablet Take 10 mg by mouth daily. 09/28/19  Yes [provider]  ondansetron (ZOFRAN) 4 MG tablet Take 1 tablet (4 mg total) by mouth every 6 (six) hours as needed for nausea. 09/12/19  Yes Alwyn Ren, MD     Family History  Problem Relation Age of Onset  . Asthma Mother   . Thrombocytopenia Mother   .  Dementia Father   . Stroke Neg Hx     Social History   Socioeconomic History  . Marital status: Single    Spouse name: Not on file  . Number of children: Not on file  . Years of education: Not on file  . Highest education level: Not on file  Occupational History  . Occupation: retired  Tobacco Use  . Smoking status: Never Smoker  . Smokeless tobacco: Never Used  Substance and Sexual Activity  . Alcohol use: No  . Drug use: No  . Sexual activity: Not on file  Other Topics Concern  . Not on file  Social History Narrative  . Not on file   Social Determinants of Health   Financial Resource Strain:   . Difficulty of Paying Living Expenses: Not on file  Food Insecurity:   . Worried About Programme researcher, broadcasting/film/video in the Last Year: Not on file  . Ran Out of Food in the Last Year: Not on file  Transportation Needs:   . Lack of Transportation (Medical): Not on file  . Lack of Transportation (Non-Medical): Not on file  Physical Activity:   . Days of Exercise per Week: Not on file  . Minutes of Exercise per Session: Not on file  Stress:   . Feeling of Stress : Not on file  Social Connections:   . Frequency of Communication with Friends and Family: Not on file  . Frequency of Social Gatherings with Friends and Family: Not on file  . Attends Religious Services: Not on file  . Active Member of Clubs or Organizations: Not on file  . Attends Banker Meetings: Not on file  . Marital Status: Not on file     Review of Systems: A 12 point ROS discussed and pertinent positives are indicated in the HPI above.  All other systems are negative.  Review of Systems  Constitutional: Negative for chills and fever.  Respiratory: Negative for shortness of breath and wheezing.   Cardiovascular: Negative for chest pain and palpitations.  Gastrointestinal: Negative for abdominal pain.  Neurological: Negative for speech difficulty, weakness and headaches.  Psychiatric/Behavioral:  Negative for behavioral problems and confusion.    Vital Signs: BP (!) 154/61 (BP Location: Left Arm)   Pulse 72   Temp 98.5 F (36.9 C) (Oral)   Resp 18   Ht 5\' 4"  (1.626 m)   Wt 169 lb 12.1 oz (77 kg)   SpO2 100%   BMI 29.14 kg/m   Physical Exam Vitals and nursing note reviewed.  Constitutional:      General: She is not in acute distress.    Appearance: Normal appearance.  Cardiovascular:     Rate and Rhythm: Normal rate and regular rhythm.     Heart sounds: Normal heart sounds. No murmur.  Pulmonary:     Effort: Pulmonary effort is normal. No respiratory distress.     Breath sounds: Normal breath sounds. No wheezing.  Skin:    General: Skin is warm and dry.  Neurological:     Mental Status: She is alert and oriented to person, place, and time.  Psychiatric:        Mood and Affect:  Mood normal.        Behavior: Behavior normal.      MD Evaluation Airway: WNL Heart: WNL Heart  comments: systolic murmur Abdomen: WNL Chest/ Lungs: WNL ASA  Classification: 3 Mallampati/Airway Score: Two   Imaging: EEG  Result Date: 09/11/2019 Lora Havens, MD     09/11/2019 11:39 AM Patient Name: Amy Raymond MRN: [id number] Epilepsy Attending: Lora Havens Referring Physician/Provider: Dr Amie Portland Date: 09/11/2019 Duration: 23.34 mins Patient history: 54 y.o. female with past medical history significant for diabetes mellitus, thyroid disease and migraines with visual aura presents to the emergency room with sudden onset inability to get words out followed by dizziness and facial numbness.  She had a previous episode of bilateral vision loss. Then had an episode of staring spell. EEG to assess for seizure. Level of alertness: Awake AEDs during EEG study: None Technical aspects: This EEG study was done with scalp electrodes positioned according to the 10-20 International system of electrode placement. Electrical activity was acquired at a sampling rate of 500Hz  and reviewed  with a high frequency filter of 70Hz  and a low frequency filter of 1Hz . EEG data were recorded continuously and digitally stored. DESCRIPTION: The posterior dominant rhythm consists of 9-10 Hz activity of moderate voltage (25-35 uV) seen predominantly in posterior head regions, symmetric and reactive to eye opening and eye closing. Physiologic photic driving was seen during photic stimulation. Hyperventilation was not performed due to acute CVA. IMPRESSION: This study is within normal limits. No seizures or epileptiform discharges were seen throughout the recording. Lora Havens   CT Angio Head W or Wo Contrast  Result Date: 10/03/2019 CLINICAL DATA:  Found unresponsive. EXAM: CT ANGIOGRAPHY HEAD AND NECK TECHNIQUE: Multidetector CT imaging of the head and neck was performed using the standard protocol during bolus administration of intravenous contrast. Multiplanar CT image reconstructions and MIPs were obtained to evaluate the vascular anatomy. Carotid stenosis measurements (when applicable) are obtained utilizing NASCET criteria, using the distal internal carotid diameter as the denominator. CONTRAST:  81mL OMNIPAQUE IOHEXOL 350 MG/ML SOLN COMPARISON:  09/10/2019 FINDINGS: CT HEAD FINDINGS Brain: Low-density in the ventral midline pons, new. No acute hemorrhage, hydrocephalus, or masslike finding. Vascular: See below Skull: Negative Sinuses: Clear Orbits: Negative Review of the MIP images confirms the above findings CTA NECK FINDINGS Aortic arch: 2 vessel arch.  No acute finding Right carotid system: Atheromatous wall thickening of the common carotid and likely of the bulb. No flow limiting stenosis or ulceration. Left carotid system: Low-density atheromatous wall thickening of the common carotid and likely of the bulb. No ulceration flow limiting stenosis Vertebral arteries: No proximal subclavian stenosis. The non dominant left vertebral artery shows thready flow with no meaningful patency at the level  of the dura. Right vertebral artery is smoothly contoured and patent, partially obscured by intravenous contrast at the V1 segment. Skeleton: Cervical disc degeneration. Other neck: No acute or aggressive finding. Upper chest: Negative Review of the MIP images confirms the above findings CTA HEAD FINDINGS Anterior circulation: Atherosclerotic plaque at the carotid siphons with left more than right stenosis. At least 75% narrowing is seen at the posterior and anterior genu on the left. On the right at the posterior genu there is 60% stenosis. No branch occlusion or beading. Negative for aneurysm. Posterior circulation: No meaningful flow in the left vertebral artery, chronic. The right V4 segment is diffusely patent. Chronic moderate narrowing of the basilar distally. No branch occlusion or beading. Negative for  aneurysm Venous sinuses: Patent Anatomic variants: Negative Review of the MIP images confirms the above findings IMPRESSION: 1. Low-density in the ventral pons that is new from January 2021. History suggests acute infarct but this would have an atypical central location. Recommend brain MRI 2. No emergent vascular finding. 3. 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 4. Severe left cavernous ICA stenosis. 60% narrowing of the right cavernous ICA. Electronically Signed   By: Marnee Spring M.D.   On: 10/03/2019 06:12   CT Angio Head W or Wo Contrast  Result Date: 09/11/2019 CLINICAL DATA:  Initial evaluation for acute headache, syncope. EXAM: CT ANGIOGRAPHY HEAD AND NECK TECHNIQUE: Multidetector CT imaging of the head and neck was performed using the standard protocol during bolus administration of intravenous contrast. Multiplanar CT image reconstructions and MIPs were obtained to evaluate the vascular anatomy. Carotid stenosis measurements (when applicable) are obtained utilizing NASCET criteria, using the distal internal carotid diameter as the  denominator. CONTRAST:  32mL OMNIPAQUE IOHEXOL 350 MG/ML SOLN COMPARISON:  Prior noncontrast head CT from earlier the same day. FINDINGS: CTA NECK FINDINGS Aortic arch: Visualized aortic arch of normal caliber with normal branch pattern. No hemodynamically significant stenosis seen about the origin of the great vessels. Visualized subclavian arteries widely patent. Right carotid system: Right common and internal carotid arteries widely patent without stenosis, dissection, or occlusion. Left carotid system: Mild eccentric soft plaque within the mid left CCA with no more than mild stenosis. Left common and internal carotid arteries otherwise widely patent without stenosis, dissection or occlusion. Vertebral arteries: Both vertebral arteries arise from the subclavian arteries. Right vertebral artery dominant and widely patent within the neck. Left vertebral artery diffusely hypoplastic and largely occluded at its origin. Attenuated and irregular distal reconstitution at the left V2 segment, with irregular attenuated flow distally within the neck. Left vertebral is grossly patent as it courses into the skull base. Skeleton: No acute osseous abnormality. No discrete or worrisome osseous lesions. Mild cervical spondylosis at C3-4 through C5-6. Other neck: No other acute soft tissue abnormality within the neck. No mass lesion or adenopathy. Upper chest: Multifocal patchy parenchymal opacity seen within the peripheral upper lobes bilaterally, right greater than left, concerning for multifocal pneumonia. Visualized upper chest demonstrates no other acute finding. Review of the MIP images confirms the above findings CTA HEAD FINDINGS Anterior circulation: Petrous segments widely patent. Multifocal atheromatous plaque throughout the cavernous/supraclinoid ICAs bilaterally. Associated up to moderate approximate 50% stenosis at the cavernous right ICA (series 13, image 429). On the left, there is resultant moderate to severe  multifocal stenoses (series 13, image 428). A1 segments patent bilaterally. Normal anterior communicating artery. Anterior cerebral arteries patent to their distal aspects without stenosis. No M1 stenosis or occlusion. Normal MCA bifurcations. Distal MCA branches well perfused and symmetric. Posterior circulation: Dominant right vertebral artery patent to the vertebrobasilar junction without stenosis. Patent right PICA. Left vertebral artery attenuated but patent at the skull base, but subsequently occludes, and remains occluded to the vertebrobasilar junction. Left PICA not seen. Moderate diffuse stenosis noted at the mid basilar artery (series 13, image 430). Superior cerebral arteries patent bilaterally. Both PCAs primarily supplied via the basilar and are well perfused to their distal aspects. Small right posterior communicating artery noted. Venous sinuses: Patent. 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 No appreciable intracranial aneurysm. Review of the MIP images confirms the above findings IMPRESSION: 1. Negative CTA for emergent  large vessel occlusion. 2. 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 Dominant right vertebral artery remains widely patent. 3. Additional moderate diffuse atheromatous narrowing of the mid basilar artery. 4. Multifocal atheromatous change throughout the carotid siphons, with associated moderate to severe multifocal narrowing on the left and more moderate narrowing on the right. Both carotid artery systems remain widely patent within the neck. 5. Multifocal parenchymal opacities within the visualized lungs, consistent with multifocal pneumonia. Electronically Signed   By: Rise Mu M.D.   On: 09/11/2019 00:15   CT Head Wo Contrast  Result Date: 09/10/2019 CLINICAL DATA:  Found  unresponsive. EXAM: CT HEAD WITHOUT CONTRAST TECHNIQUE: Contiguous axial images were obtained from the base of the skull through the vertex without intravenous contrast. COMPARISON:  May 31, 2012 FINDINGS: Brain: No evidence of acute infarction, hemorrhage, hydrocephalus, extra-axial collection or mass lesion/mass effect. Vascular: No hyperdense vessel or unexpected calcification. Skull: Normal. Negative for fracture or focal lesion. Sinuses/Orbits: No acute finding. Other: None. IMPRESSION: No acute intracranial pathology. Electronically Signed   By: Aram Candela M.D.   On: 09/10/2019 22:28   CT Angio Neck W and/or Wo Contrast  Result Date: 10/03/2019 CLINICAL DATA:  Found unresponsive. EXAM: CT ANGIOGRAPHY HEAD AND NECK TECHNIQUE: Multidetector CT imaging of the head and neck was performed using the standard protocol during bolus administration of intravenous contrast. Multiplanar CT image reconstructions and MIPs were obtained to evaluate the vascular anatomy. Carotid stenosis measurements (when applicable) are obtained utilizing NASCET criteria, using the distal internal carotid diameter as the denominator. CONTRAST:  13mL OMNIPAQUE IOHEXOL 350 MG/ML SOLN COMPARISON:  09/10/2019 FINDINGS: CT HEAD FINDINGS Brain: Low-density in the ventral midline pons, new. No acute hemorrhage, hydrocephalus, or masslike finding. Vascular: See below Skull: Negative Sinuses: Clear Orbits: Negative Review of the MIP images confirms the above findings CTA NECK FINDINGS Aortic arch: 2 vessel arch.  No acute finding Right carotid system: Atheromatous wall thickening of the common carotid and likely of the bulb. No flow limiting stenosis or ulceration. Left carotid system: Low-density atheromatous wall thickening of the common carotid and likely of the bulb. No ulceration flow limiting stenosis Vertebral arteries: No proximal subclavian stenosis. The non dominant left vertebral artery shows thready flow with no meaningful  patency at the level of the dura. Right vertebral artery is smoothly contoured and patent, partially obscured by intravenous contrast at the V1 segment. Skeleton: Cervical disc degeneration. Other neck: No acute or aggressive finding. Upper chest: Negative Review of the MIP images confirms the above findings CTA HEAD FINDINGS Anterior circulation: Atherosclerotic plaque at the carotid siphons with left more than right stenosis. At least 75% narrowing is seen at the posterior and anterior genu on the left. On the right at the posterior genu there is 60% stenosis. No branch occlusion or beading. Negative for aneurysm. Posterior circulation: No meaningful flow in the left vertebral artery, chronic. The right V4 segment is diffusely patent. Chronic moderate narrowing of the basilar distally. No branch occlusion or beading. Negative for aneurysm Venous sinuses: Patent Anatomic variants: Negative Review of the MIP images confirms the above findings IMPRESSION: 1. Low-density in the ventral pons that is new from January 2021. History suggests acute infarct but this would have an atypical central location. Recommend brain MRI 2. No emergent vascular finding. 3. 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 4. Severe left  cavernous ICA stenosis. 60% narrowing of the right cavernous ICA. Electronically Signed   By: Marnee Spring M.D.   On: 10/03/2019 06:12   CT Angio Neck W and/or Wo Contrast  Result Date: 09/11/2019 CLINICAL DATA:  Initial evaluation for acute headache, syncope. EXAM: CT ANGIOGRAPHY HEAD AND NECK TECHNIQUE: Multidetector CT imaging of the head and neck was performed using the standard protocol during bolus administration of intravenous contrast. Multiplanar CT image reconstructions and MIPs were obtained to evaluate the vascular anatomy. Carotid stenosis measurements (when applicable) are obtained utilizing NASCET criteria, using the distal internal  carotid diameter as the denominator. CONTRAST:  75mL OMNIPAQUE IOHEXOL 350 MG/ML SOLN COMPARISON:  Prior noncontrast head CT from earlier the same day. FINDINGS: CTA NECK FINDINGS Aortic arch: Visualized aortic arch of normal caliber with normal branch pattern. No hemodynamically significant stenosis seen about the origin of the great vessels. Visualized subclavian arteries widely patent. Right carotid system: Right common and internal carotid arteries widely patent without stenosis, dissection, or occlusion. Left carotid system: Mild eccentric soft plaque within the mid left CCA with no more than mild stenosis. Left common and internal carotid arteries otherwise widely patent without stenosis, dissection or occlusion. Vertebral arteries: Both vertebral arteries arise from the subclavian arteries. Right vertebral artery dominant and widely patent within the neck. Left vertebral artery diffusely hypoplastic and largely occluded at its origin. Attenuated and irregular distal reconstitution at the left V2 segment, with irregular attenuated flow distally within the neck. Left vertebral is grossly patent as it courses into the skull base. Skeleton: No acute osseous abnormality. No discrete or worrisome osseous lesions. Mild cervical spondylosis at C3-4 through C5-6. Other neck: No other acute soft tissue abnormality within the neck. No mass lesion or adenopathy. Upper chest: Multifocal patchy parenchymal opacity seen within the peripheral upper lobes bilaterally, right greater than left, concerning for multifocal pneumonia. Visualized upper chest demonstrates no other acute finding. Review of the MIP images confirms the above findings CTA HEAD FINDINGS Anterior circulation: Petrous segments widely patent. Multifocal atheromatous plaque throughout the cavernous/supraclinoid ICAs bilaterally. Associated up to moderate approximate 50% stenosis at the cavernous right ICA (series 13, image 429). On the left, there is  resultant moderate to severe multifocal stenoses (series 13, image 428). A1 segments patent bilaterally. Normal anterior communicating artery. Anterior cerebral arteries patent to their distal aspects without stenosis. No M1 stenosis or occlusion. Normal MCA bifurcations. Distal MCA branches well perfused and symmetric. Posterior circulation: Dominant right vertebral artery patent to the vertebrobasilar junction without stenosis. Patent right PICA. Left vertebral artery attenuated but patent at the skull base, but subsequently occludes, and remains occluded to the vertebrobasilar junction. Left PICA not seen. Moderate diffuse stenosis noted at the mid basilar artery (series 13, image 430). Superior cerebral arteries patent bilaterally. Both PCAs primarily supplied via the basilar and are well perfused to their distal aspects. Small right posterior communicating artery noted. Venous sinuses: Patent. 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 No appreciable intracranial aneurysm. Review of the MIP images confirms the above findings IMPRESSION: 1. Negative CTA for emergent large vessel occlusion. 2. 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 Dominant right vertebral artery remains widely patent. 3. Additional moderate diffuse atheromatous narrowing of the mid basilar artery. 4. Multifocal atheromatous change throughout the carotid siphons, with associated moderate to severe multifocal narrowing on the left and more moderate narrowing  on the right. Both carotid artery systems remain widely patent within the neck. 5. Multifocal parenchymal opacities within the visualized lungs, consistent with multifocal pneumonia. Electronically Signed   By: Rise MuBenjamin  McClintock M.D.   On: 09/11/2019 00:15   CT CHEST WO CONTRAST  Result Date:  09/11/2019 CLINICAL DATA:  Pneumonia EXAM: CT CHEST WITHOUT CONTRAST TECHNIQUE: Multidetector CT imaging of the chest was performed following the standard protocol without IV contrast. COMPARISON:  Radiography same day FINDINGS: Cardiovascular: Heart size is normal. There is some coronary artery calcification. There is some aortic atherosclerotic calcification. No sign of aneurysm. No pericardial fluid. Mediastinum/Nodes: No mass or lymphadenopathy seen on this noncontrast study. Lungs/Pleura: Widely dispersed patchy pulmonary infiltrates with a somewhat peripheral prominent distribution, typical of infiltrates seen with coronavirus infection. No lobar consolidation or collapse. No pleural effusion. No cavitation. Upper Abdomen: Multiple bilateral nonobstructing renal calculi. Probable gallstones. Musculoskeletal: Negative IMPRESSION: Widely dispersed patchy pulmonary infiltrates typical of those seen in coronavirus infection. No lobar consolidation or collapse. Multiple nonobstructing renal calculi. Probable gallstones. Aortic atherosclerosis.  Coronary artery calcification. Electronically Signed   By: Paulina FusiMark  Shogry M.D.   On: 09/11/2019 08:44   MR BRAIN WO CONTRAST  Result Date: 10/03/2019 CLINICAL DATA:  Recurrent falls. Weakness. Abnormality in the pons on CT. EXAM: MRI HEAD WITHOUT CONTRAST TECHNIQUE: Multiplanar, multiecho pulse sequences of the brain and surrounding structures were obtained without intravenous contrast. COMPARISON:  Head CT/CTA 10/03/2019 and MRI 09/11/2019 FINDINGS: Brain: There is a 15 x 12 mm acute infarct in the ventral pons located within and to the left of the midline. Punctate foci of diffusion weighted signal abnormality in the posterior cerebral hemispheres on the prior MRI have resolved. Small foci of T2 hyperintensity in the cerebral white matter bilaterally are unchanged and nonspecific but compatible with mildly age advanced chronic small vessel ischemic disease. A chronic  lacunar infarct is again noted in the left thalamus. The ventricles and sulci are within normal limits for age. No intracranial hemorrhage, mass, midline shift, or extra-axial fluid collection is identified. Vascular: Unchanged abnormal appearance of the distal left vertebral artery with only thready intermittent flow shown on CTA. Skull and upper cervical spine: Unremarkable bone marrow signal. Sinuses/Orbits: Unremarkable orbits. Paranasal sinuses and mastoid air cells are clear. Other: None. IMPRESSION: 1. Acute pontine infarct. 2. Mild chronic small vessel ischemic disease. Electronically Signed   By: Sebastian AcheAllen  Grady M.D.   On: 10/03/2019 07:54   MR BRAIN WO CONTRAST  Result Date: 09/11/2019 CLINICAL DATA:  Initial evaluation for acute headache, bilateral visual loss, syncope. EXAM: MRI HEAD WITHOUT CONTRAST TECHNIQUE: Multiplanar, multiecho pulse sequences of the brain and surrounding structures were obtained without intravenous contrast. COMPARISON:  Prior CT and CTA from 09/10/2019. FINDINGS: Brain: Generalized age-related cerebral atrophy. Patchy T2/FLAIR hyperintensities seen within the periventricular, deep, and subcortical white matter both cerebral hemispheres, nonspecific, but most like related chronic microvascular ischemic disease, mild in nature. Superimposed remote lacunar infarct present within the left thalamus. Subtle patchy diffusion abnormality seen involving the cortical and subcortical aspect of the bilateral parieto-occipital regions (series 5, image 72, 66 on axial DWI sequence, series 7, image 37 on coronal DWI sequence, suspicious for tiny acute ischemic infarcts. ADC correlate difficult to visualize given small size. No associated hemorrhage. No other evidence for acute or subacute ischemia. Gray-white matter differentiation otherwise maintained. No foci of susceptibility artifact to suggest acute or chronic intracranial hemorrhage. No mass lesion, midline shift or mass effect. No  hydrocephalus. No extra-axial fluid  collection. Pituitary gland suprasellar region normal. Midline structures intact. Vascular: Loss of normal flow void within the hypoplastic left vertebral artery, consistent with previously identified occlusion. Major intracranial vascular flow voids otherwise maintained. Skull and upper cervical spine: Craniocervical junction within normal limits. Bone marrow signal intensity normal. No scalp soft tissue abnormality. Sinuses/Orbits: Globes and orbital soft tissues within normal limits. Mild scattered mucosal thickening noted within the ethmoidal air cells. Paranasal sinuses are otherwise clear. No mastoid effusion. Inner ear structures grossly normal. Other: None. IMPRESSION: 1. Few tiny punctate foci of diffusion abnormality involving the parieto-occipital regions bilaterally as above, suspicious for tiny acute ischemic infarcts given patient's symptoms. No associated hemorrhage or mass effect. 2. Underlying age-related cerebral atrophy with chronic microvascular ischemic disease, with additional chronic left thalamic lacunar infarct. Electronically Signed   By: Rise Mu M.D.   On: 09/11/2019 02:17   CT ABDOMEN PELVIS W CONTRAST  Result Date: 09/12/2019 CLINICAL DATA:  Flank pain. EXAM: CT ABDOMEN AND PELVIS WITH CONTRAST TECHNIQUE: Multidetector CT imaging of the abdomen and pelvis was performed using the standard protocol following bolus administration of intravenous contrast. CONTRAST:  OMNIPAQUE IOHEXOL 300 MG/ML  SOLN COMPARISON:  None. FINDINGS: Lower chest: Numerous small patchy infiltrates are seen scattered throughout the bilateral lung bases. Hepatobiliary: No focal liver abnormality is seen. Mild diffuse fatty infiltration of the liver is noted. A well-defined 1.9 cm x 1.4 cm area of low-attenuation is seen within the lumen of an otherwise normal-appearing gallbladder (axial CT image 34, CT series number 3). Pancreas: Unremarkable. No pancreatic  ductal dilatation or surrounding inflammatory changes. Spleen: Normal in size without focal abnormality. Adrenals/Urinary Tract: Adrenal glands are unremarkable. Kidneys are normal, without renal calculi, focal lesion, or hydronephrosis. Bladder is unremarkable. Stomach/Bowel: Stomach is within normal limits. Appendix appears normal. No evidence of bowel wall thickening, distention, or inflammatory changes. Vascular/Lymphatic: Mild aortic atherosclerosis. No enlarged abdominal or pelvic lymph nodes. Reproductive: Uterus and bilateral adnexa are unremarkable. Other: A small amount air is seen within the subcutaneous fat along the anterior aspect of the lower pelvic wall on the left. Musculoskeletal: No acute or significant osseous findings. IMPRESSION: 1. Fatty liver. 2. Cholelithiasis, without evidence of acute cholecystitis. 3. Mild to moderate severity bibasilar infiltrates. Electronically Signed   By: Aram Candela M.D.   On: 09/12/2019 20:50   DG Chest Port 1 View  Result Date: 09/11/2019 CLINICAL DATA:  Pulmonary opacities on recent neck CT. COVID exposure. EXAM: PORTABLE CHEST 1 VIEW COMPARISON:  CT neck 09/10/2019; chest radiograph 05/31/2012 FINDINGS: Monitoring leads overlie the patient. Stable cardiac and mediastinal contours. Patchy bilateral airspace opacities are demonstrated. No pleural effusion or pneumothorax. IMPRESSION: Patchy bilateral airspace opacities most compatible with history of COVID-19. Electronically Signed   By: Annia Belt M.D.   On: 09/11/2019 04:12   ECHO TEE  Result Date: 10/04/2019    TRANSESOPHOGEAL ECHO REPORT   Patient Name:   JARROD BODKINS Date of Exam: 10/04/2019 Medical Rec #:  [id number]       Height:       64.0 in Accession #:    [id number]      Weight:       169.8 lb Date of Birth:  27-Jul-1966       BSA:          1.825 m Patient Age:    53 years        BP:           188/62 mmHg Patient  Gender: F               HR:           87 bpm. Exam Location:  Inpatient  Procedure: 2D Echo, Transesophageal Echo and Saline Contrast Bubble Study Indications:     stroke  History:         Patient has prior history of Echocardiogram examinations, most                  recent 09/11/2019.  Sonographer:     Delcie Roch Referring Phys:  [id number] Manson Passey Diagnosing Phys: Chilton Si MD PROCEDURE: The transesophogeal probe was passed without difficulty through the esophogus of the patient. Local oropharyngeal anesthetic was provided with Cetacaine. Sedation performed by performing physician. Patients was under conscious sedation during this procedure. Anesthetic administered: of Fentanyl, 5.0mg  of Versed. The patient's vital signs; including heart rate, blood pressure, and oxygen saturation; remained stable throughout the procedure. The patient developed no complications during the procedure. IMPRESSIONS  1. Left ventricular ejection fraction, by estimation, is 60 to 65%. The left ventricle has normal function. The left ventricle has no regional wall motion abnormalities. Left ventricular diastolic function could not be evaluated.  2. Right ventricular systolic function is normal. The right ventricular size is normal. There is normal pulmonary artery systolic pressure.  3. No left atrial/left atrial appendage thrombus was detected.  4. The mitral valve is normal in structure and function. Trivial mitral valve regurgitation. No evidence of mitral stenosis.  5. The aortic valve is tricuspid. Aortic valve regurgitation is trivial. No aortic stenosis is present.  6. The inferior vena cava is normal in size with greater than 50% respiratory variability, suggesting right atrial pressure of 3 mmHg.  7. Agitated saline contrast bubble study was negative, with no evidence of any interatrial shunt. Conclusion(s)/Recommendation(s): Normal biventricular function without evidence of hemodynamically significant valvular heart disease. FINDINGS  Left Ventricle: Left ventricular  ejection fraction, by estimation, is 60 to 65%. The left ventricle has normal function. The left ventricle has no regional wall motion abnormalities. The left ventricular internal cavity size was normal in size. There is  no left ventricular hypertrophy. Right Ventricle: The right ventricular size is normal. No increase in right ventricular wall thickness. Right ventricular systolic function is normal. There is normal pulmonary artery systolic pressure. The tricuspid regurgitant velocity is 1.91 m/s, and  with an assumed right atrial pressure of 0 mmHg, the estimated right ventricular systolic pressure is 14.6 mmHg. Left Atrium: Left atrial size was normal in size. No left atrial/left atrial appendage thrombus was detected. Right Atrium: Right atrial size was normal in size. Pericardium: There is no evidence of pericardial effusion. Mitral Valve: The mitral valve is normal in structure and function. Normal mobility of the mitral valve leaflets. Trivial mitral valve regurgitation. No evidence of mitral valve stenosis. Tricuspid Valve: The tricuspid valve is normal in structure. Tricuspid valve regurgitation is mild . No evidence of tricuspid stenosis. Aortic Valve: The aortic valve is tricuspid. Aortic valve regurgitation is trivial. No aortic stenosis is present. Pulmonic Valve: The pulmonic valve was normal in structure. Pulmonic valve regurgitation is not visualized. No evidence of pulmonic stenosis. Aorta: The aortic root is normal in size and structure. Venous: The inferior vena cava is normal in size with greater than 50% respiratory variability, suggesting right atrial pressure of 3 mmHg. IAS/Shunts: No atrial level shunt detected by color flow Doppler. Agitated saline contrast was given intravenously to evaluate for  intracardiac shunting. Agitated saline contrast bubble study was negative, with no evidence of any interatrial shunt. There  is no evidence of a patent foramen ovale. There is no evidence of an  atrial septal defect.  TRICUSPID VALVE TR Peak grad:   14.6 mmHg TR Vmax:        191.00 cm/s Chilton Si MD Electronically signed by Chilton Si MD Signature Date/Time: 10/04/2019/4:03:07 PM    Final    ECHOCARDIOGRAM COMPLETE BUBBLE STUDY  Result Date: 09/11/2019   ECHOCARDIOGRAM REPORT   Patient Name:   Amy Raymond Date of Exam: 09/11/2019 Medical Rec #:  [id number]       Height:       62.0 in Accession #:    [id number]      Weight:       178.1 lb Date of Birth:  1966-04-21       BSA:          1.82 m Patient Age:    53 years        BP:           131/74 mmHg Patient Gender: F               HR:           94 bpm. Exam Location:  Inpatient Portions of this table do not appear on this page. Procedure: 2D Echo Indications:    PFO  History:        Patient has no prior history of Echocardiogram examinations.                 Risk Factors:Diabetes.  Sonographer:    Celene Skeen RDCS (AE) Referring Phys: [id number] MIRCEA G CRISTESCU IMPRESSIONS  1. Left ventricular ejection fraction, by visual estimation, is 60 to 65%. The left ventricle has normal function. There is mildly increased left ventricular hypertrophy.  2. The left ventricle has no regional wall motion abnormalities.  3. Global right ventricle has normal systolic function.The right ventricular size is normal. No increase in right ventricular wall thickness.  4. Left atrial size was normal.  5. Right atrial size was normal.  6. The mitral valve is abnormal. Trivial mitral valve regurgitation.  7. The tricuspid valve is normal in structure.  8. The tricuspid valve is normal in structure. Tricuspid valve regurgitation is trivial.  9. The aortic valve is tricuspid. Aortic valve regurgitation is not visualized. Mild aortic valve sclerosis without stenosis. 10. Pulmonic regurgitation is mild. 11. The pulmonic valve was normal in structure. Pulmonic valve regurgitation is mild. 12. The inferior vena cava is normal in size with greater than 50% respiratory  variability, suggesting right atrial pressure of 3 mmHg. FINDINGS  Left Ventricle: Left ventricular ejection fraction, by visual estimation, is 60 to 65%. The left ventricle has normal function. The left ventricle has no regional wall motion abnormalities. The left ventricular internal cavity size was the left ventricle is normal in size. There is mildly increased left ventricular hypertrophy. Right Ventricle: The right ventricular size is normal. No increase in right ventricular wall thickness. Global RV systolic function is has normal systolic function. Left Atrium: Left atrial size was normal in size. Right Atrium: Right atrial size was normal in size Pericardium: There is no evidence of pericardial effusion. Mitral Valve: The mitral valve is abnormal. There is mild thickening of the mitral valve leaflet(s). Trivial mitral valve regurgitation. Tricuspid Valve: The tricuspid valve is normal in structure. Tricuspid valve regurgitation is trivial. Aortic Valve: The aortic  valve is tricuspid. Aortic valve regurgitation is not visualized. Mild aortic valve sclerosis is present, with no evidence of aortic valve stenosis. Pulmonic Valve: The pulmonic valve was normal in structure. Pulmonic valve regurgitation is mild. Pulmonic regurgitation is mild. Aorta: The aortic root is normal in size and structure. Venous: The inferior vena cava is normal in size with greater than 50% respiratory variability, suggesting right atrial pressure of 3 mmHg. IAS/Shunts: No atrial level shunt detected by color flow Doppler.  LEFT VENTRICLE PLAX 2D LVIDd:         2.50 cm  Diastology LVIDs:         1.80 cm  LV e' lateral:   10.70 cm/s LV PW:         1.20 cm  LV E/e' lateral: 5.9 LV IVS:        1.30 cm LVOT diam:     2.00 cm LV SV:         13 ml LV SV Index:   6.59 LVOT Area:     3.14 cm  RIGHT VENTRICLE RV S prime:     12.70 cm/s TAPSE (M-mode): 1.9 cm LEFT ATRIUM             Index       RIGHT ATRIUM          Index LA diam:        3.00 cm  1.65 cm/m  RA Area:     7.57 cm LA Vol (A2C):   22.5 ml 12.36 ml/m RA Volume:   11.50 ml 6.32 ml/m LA Vol (A4C):   18.2 ml 10.00 ml/m LA Biplane Vol: 21.1 ml 11.59 ml/m  AORTIC VALVE LVOT Vmax:   69.20 cm/s LVOT Vmean:  55.400 cm/s LVOT VTI:    0.120 m  AORTA Ao Root diam: 3.30 cm MITRAL VALVE MV Area (PHT): 1.75 cm             SHUNTS MV PHT:        125.57 msec          Systemic VTI:  0.12 m MV Decel Time: 433 msec             Systemic Diam: 2.00 cm MV E velocity: 63.00 cm/s 103 cm/s MV A velocity: 96.40 cm/s 70.3 cm/s MV E/A ratio:  0.65       1.5  Dietrich Pates MD Electronically signed by Dietrich Pates MD Signature Date/Time: 09/11/2019/4:23:28 PM    Final    VAS [place] LOWER EXTREMITY VENOUS (DVT)  Result Date: 09/12/2019  Lower Venous Study Indications: Stroke.  Risk Factors: DM. Limitations: Poor ultrasound/tissue interface. Comparison Study: No prior exam. Performing Technologist: Kennedy Bucker ARDMS, RVT  Examination Guidelines: A complete evaluation includes B-mode imaging, spectral Doppler, color Doppler, and power Doppler as needed of all accessible portions of each vessel. Bilateral testing is considered an integral part of a complete examination. Limited examinations for reoccurring indications may be performed as noted.  +---------+---------------+---------+-----------+----------+--------------+ RIGHT    CompressibilityPhasicitySpontaneityPropertiesThrombus Aging +---------+---------------+---------+-----------+----------+--------------+ CFV      Full           Yes      Yes                                 +---------+---------------+---------+-----------+----------+--------------+ SFJ      Full                                                        +---------+---------------+---------+-----------+----------+--------------+  FV Prox  Full                                                        +---------+---------------+---------+-----------+----------+--------------+ FV Mid    Full                                                        +---------+---------------+---------+-----------+----------+--------------+ FV DistalFull                                                        +---------+---------------+---------+-----------+----------+--------------+ PFV      Full                                                        +---------+---------------+---------+-----------+----------+--------------+ POP      Full           Yes      Yes                                 +---------+---------------+---------+-----------+----------+--------------+ PTV      Full                                                        +---------+---------------+---------+-----------+----------+--------------+ PERO     Full                                                        +---------+---------------+---------+-----------+----------+--------------+ Hypoechoic, oval structure with echogenic center seen in right groin measuring 3.8 x 1.7 x 0.8 cm.  +---------+---------------+---------+-----------+----------+--------------+ LEFT     CompressibilityPhasicitySpontaneityPropertiesThrombus Aging +---------+---------------+---------+-----------+----------+--------------+ CFV      Full           Yes      Yes                                 +---------+---------------+---------+-----------+----------+--------------+ SFJ      Full                                                        +---------+---------------+---------+-----------+----------+--------------+ FV Prox  Full                                                        +---------+---------------+---------+-----------+----------+--------------+  FV Mid   Full                                                        +---------+---------------+---------+-----------+----------+--------------+ FV DistalFull                                                         +---------+---------------+---------+-----------+----------+--------------+ PFV      Full                                                        +---------+---------------+---------+-----------+----------+--------------+ POP      Full           Yes      Yes                                 +---------+---------------+---------+-----------+----------+--------------+ PTV      Full                                                        +---------+---------------+---------+-----------+----------+--------------+ PERO     Full                                                        +---------+---------------+---------+-----------+----------+--------------+ Poorly visualized bilteral calf veins due to tissue properties.    Summary: Right: There is no evidence of deep vein thrombosis in the lower extremity. No cystic structure found in the popliteal fossa. Hypoechoic, oval structure with echogenic center seen in right groin measuring 3.8 x 1.7 x 0.8 cm. Left: There is no evidence of deep vein thrombosis in the lower extremity. No cystic structure found in the popliteal fossa.  *See table(s) above for measurements and observations. Electronically signed by Fabienne Brunsharles Fields MD on 09/12/2019 at 5:11:30 PM.    Final    US Abdomen Limited RUQ  Result Date: 09/11/2019 CLINICAL DATA:  Transaminitis. EXAM: ULTRASOUND ABDOMEN LIMITED RIGHT UPPER QUADRANT COMPARISON:  None. FINDINGS: Gallbladder: Single mobile 1.6 cm gallstone is present. No evidence of gallbladder wall thickening or pericholecystic fluid. Negative sonographic Murphy sign. Common bile duct: Diameter: 3.4 mm. Liver: Mild increased echogenicity without focal mass. Portal vein is patent on color Doppler imaging with normal direction of blood flow towards the liver. Other: None. IMPRESSION: 1. Single 1.6 cm mobile gallstone. No additional findings to suggest acute cholecystitis. 2.  Suggestion of mild hepatic steatosis.  No focal mass.  Electronically Signed   By: Elberta Fortisaniel  Boyle M.D.   On: 09/11/2019 09:16    Labs:  CBC: Recent Labs    09/10/19 2150 09/11/19 0659 10/03/19 0445  WBC 8.5 8.5  8.4  HGB 11.0* 10.7* 10.1*  HCT 34.3* 32.6* 32.2*  PLT 462* 607* 524*    COAGS: Recent Labs    09/10/19 2150 10/03/19 0921  INR 1.0 1.1  APTT 28 31    BMP: Recent Labs    09/10/19 2150 09/11/19 0659 10/03/19 0445  NA 137 141 140  K 5.3* 4.0 3.3*  CL 100 103 101  CO2 23 26 24   GLUCOSE 209* 177* 180*  BUN 10 6 9   CALCIUM 9.0 9.4 9.3  CREATININE 0.79 0.69 0.79  GFRNONAA >60 >60 >60  GFRAA >60 >60 >60    LIVER FUNCTION TESTS: Recent Labs    09/10/19 2150 10/03/19 0445  BILITOT 1.7* 0.6  AST 139* 23  ALT 105* 21  ALKPHOS 292* 137*  PROT 8.0 7.5  ALBUMIN 3.4* 3.1*     Assessment and Plan:  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 Plan for image-guided diagnostic cerebral arteriogram today in IR. Patient is NPO. Afebrile. Lovenox held per IR protocol, ok to proceed with Plavix/Aspirin use per Dr. Quay Burow. INR 1.1 10/03/2019.  Risks and benefits of diagnostic cerebral arteriogram were discussed with the patient including, but not limited to bleeding, infection, vascular injury, stroke, or contrast induced renal failure. This interventional procedure involves the use of X-rays and because of the nature of the planned procedure, it is possible that we will have prolonged use of X-ray fluoroscopy. Potential radiation risks to you include (but are not limited to) the following: - A slightly elevated risk for cancer  several years later in life. This risk is typically less than 0.5% percent. This risk is low in comparison to the normal incidence of human cancer, which is 33% for women and 50% for men according to the [employer]. - Radiation induced injury can include skin redness, resembling a rash, tissue breakdown / ulcers and hair loss (which can be temporary  or permanent).  The likelihood of either of these occurring depends on the difficulty of the procedure and whether you are sensitive to radiation due to previous procedures, disease, or genetic conditions.  IF your procedure requires a prolonged use of radiation, you will be notified and given written instructions for further action.  It is your responsibility to monitor the irradiated area for the 2 weeks following the procedure and to notify your physician if you are concerned that you have suffered a radiation induced injury.   All of the patient's questions were answered, patient is agreeable to proceed. Consent signed and in chart.   Thank you for this interesting consult.  I greatly enjoyed meeting JOANY KHATIB and look forward to participating in their care.  A copy of this report was sent to the requesting provider on this date.  Electronically Signed: Elwin Mocha, PA-C 10/05/2019, 8:47 AM   I spent a total of 40 Minutes in face to face in clinical consultation, greater than 50% of which was counseling/coordinating care for basilar artery stenosis/diagnostic cerebral arteriogram.

## 2019-10-05 NOTE — Progress Notes (Signed)
TR band removed, transparent dressing in place

## 2019-10-05 NOTE — Procedures (Addendum)
INDICATION: Stroke. Basilar artery stenosis.  EXAM: Diagnostic cerebral angiogram  COMPARISON: CT angiogram head and neck October 03, 2019  ANESTHESIA/SEDATION: Versed 2 mg IV; Fentanyl 100 mcg IV Moderate Sedation Time: 57 minutes. The patient was continuously monitored during the procedure by the interventional radiology nurse under my direct supervision.  CONTRAST: 55 mL Omnipaque 240  FLUOROSCOPY TIME: Fluoroscopy Time: 8 minutes 6 seconds (493 mGy).  COMPLICATIONS: None immediate.  TECHNIQUE: Informed written consent was obtained from the patient after a thorough discussion of the procedural risks, benefits and alternatives. All questions were addressed. Maximal Sterile Barrier Technique was utilized including caps, mask, sterile gowns, sterile gloves, sterile drape, hand hygiene and skin antiseptic. A timeout was performed prior to the initiation of the procedure. Local anesthesia with 1 mg of lidocaine 1% and 200 mcg of nitroglycerin was performed in the right snuffbox area. Under grayscale ultrasound guidance, access was gained to the distal right radial artery using modified Seldinger technique and a micropuncture kit. A 5 French sheath was then placed in the right radial artery. Slow intra arterial infusion of 300 mcg of nitroglycerin, 2.5 mg of verapamil and 5000 units of heparin was performed via sheath side port. A road map of the right radial artery was performed with frontal view. Under the fluoroscopy, a 5 French Simmons 2 glide catheter was navigated over a 0.035 inch Terumo Glidewire into the right subclavian artery. A roadmap of the subclavian artery was obtained. Under fluoroscopy, the catheter was advanced into the right vertebral artery. [place] and lateral views of the head were obtained followed by magnified waters and [place] views, centered on the basilar artery. The catheter was pulled back and then advanced into the right common carotid artery. Frontal and lateral views  of the neck were obtained. Under biplane roadmap, the catheter was advanced into the right internal carotid artery. Frontal and lateral angiograms of the head were obtained. The catheter was pulled back and then advanced into the left common carotid artery. Frontal and lateral views of the neck were obtained. The catheter was advanced into the left internal carotid artery under biplane roadmap. Frontal, lateral and magnified oblique views of the head were obtained, centered on the intracranial left ICA. The catheter was subsequently withdrawn. The right radial sheath was removed and inflatable obtained was placed over the access site.  FINDINGS: Grayscale ultrasound: The distal right radial artery has normal caliber, adequate for vascular access. Right radial artery roadmap: The right radial artery is normal caliber. No significant anatomical variation noted. Right vertebral artery angiogram: There is mild narrowing (less than 50%) stenosis at the origin of the right vertebral artery. The intracranial right vertebral artery has normal course and caliber. Mild stenosis of the mid basilar artery (less than 50%) without flow limitation. Contrast reflux into the hypoplastic left vertebral artery V4 segment is noted with opacification of the left PICA. No opacification proximal to the PICA seen. The bilateral PICA, SCA, AICA and PCA are normal in course and caliber. No aneurysm, AVM or dural AV fistula identified. Major veins and dural sinuses are patent. Right common carotid artery angiogram: Mild atherosclerotic changes are noted in the right carotid bifurcation, without hemodynamically significant stenosis. Right internal carotid artery angiogram: Luminal irregularity of the cavernous segment of the right ICA is noted, consistent with atherosclerotic disease, with mild stenosis at the posterior genu. A prominent posterior communicating artery is noted. The right ACA and MCA have brisk contrast opacification  with luminal caliber smooth and tapering. No  aneurysm, AVM or dural AV fistula identified. Major veins and dural sinuses are patent. Left common carotid artery angiogram: Atherosclerotic changes are noted in the left carotid bifurcation, without hemodynamically significant stenosis. Left internal carotid artery angiogram: Luminal irregularity is seen in the distal petrous and cavernous left ICA, consistent with atherosclerotic disease, most significant at the paraclinoid segment, where there is approximately 54% stenosis. An infundibular dilatation is noted at the origin of the left posterior communicating artery. The left ACA and MCA have brisk contrast opacification with luminal caliber smooth and tapering. No aneurysm, AVM or dural AV fistula identified. The major veins and dural sinuses are patent.  PROCEDURE: No intervention performed  IMPRESSION: 1. 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 2. 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 3. 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  PLAN: Case discussed with Dr. Erlinda Hong immediately after the angiogram. Patient is to remain on best medical management with no stenting indicated at this time.

## 2019-10-06 ENCOUNTER — Encounter (HOSPITAL_COMMUNITY): Payer: Self-pay | Admitting: Neuroradiology

## 2019-10-06 LAB — ANTINUCLEAR ANTIBODIES, IFA: ANA Ab, IFA: NEGATIVE

## 2019-10-06 LAB — CARDIOLIPIN ANTIBODIES, IGG, IGM, IGA
Anticardiolipin IgA: <9 APL U/mL (ref 0–11)
Anticardiolipin IgG: <9 GPL U/mL (ref 0–14)
Anticardiolipin IgM: 15 MPL U/mL — ABNORMAL HIGH (ref 0–12)

## 2019-10-06 LAB — GLUCOSE, CAPILLARY: Glucose-Capillary: 72 mg/dL (ref 70–99)

## 2019-10-06 LAB — BASIC METABOLIC PANEL
Anion gap: 11 (ref 5–15)
BUN: <5 mg/dL — ABNORMAL LOW (ref 6–20)
CO2: 24 mmol/L (ref 22–32)
Calcium: 8.9 mg/dL (ref 8.9–10.3)
Chloride: 107 mmol/L (ref 98–111)
Creatinine, Ser: 0.69 mg/dL (ref 0.44–1.00)
GFR calc Af Amer: >60 mL/min (ref >60)
GFR calc non Af Amer: >60 mL/min (ref >60)
Glucose, Bld: 98 mg/dL (ref 70–99)
Potassium: 3.7 mmol/L (ref 3.5–5.1)
Sodium: 142 mmol/L (ref 135–145)

## 2019-10-06 LAB — HOMOCYSTEINE: Homocysteine: 6.3 umol/L (ref 0.0–14.5)

## 2019-10-06 LAB — LUPUS ANTICOAGULANT PANEL
DRVVT: 38.7 s (ref 0.0–47.0)
PTT Lupus Anticoagulant: 35.6 s (ref 0.0–51.9)

## 2019-10-06 LAB — BETA-2-GLYCOPROTEIN I ABS, IGG/M/A
Beta-2 Glyco I IgG: <9 GPI IgG units (ref 0–20)
Beta-2-Glycoprotein I IgA: <9 GPI IgA units (ref 0–25)
Beta-2-Glycoprotein I IgM: <9 GPI IgM units (ref 0–32)

## 2019-10-06 LAB — MAGNESIUM: Magnesium: 2 mg/dL (ref 1.7–2.4)

## 2019-10-06 MED ORDER — ATORVASTATIN CALCIUM 80 MG PO TABS: 80.0000 mg | ORAL_TABLET | Freq: Every day | ORAL | 2 refills | Status: AC

## 2019-10-06 MED ORDER — ASPIRIN 325 MG PO TBEC: 325.0000 mg | DELAYED_RELEASE_TABLET | Freq: Every day | ORAL | 2 refills | Status: DC

## 2019-10-06 MED ORDER — LEVOTHYROXINE SODIUM 100 MCG PO TABS: 100.0000 ug | ORAL_TABLET | Freq: Every day | ORAL | 2 refills | Status: AC

## 2019-10-06 NOTE — Progress Notes (Signed)
Physical Therapy Treatment Patient Details Name: Amy Raymond MRN: [id number] DOB: 1966/02/07 Today's Date: 10/06/2019    History of Present Illness  Amy Raymond is a 54 y.o. female with medical history significant of DM; HTN; HLD; and CVA 1/30 presenting with weakness, falls.  She reports that she fell yesterday.  She isn't able to get her balance right, seems to shift side to side with walking.  She was last admitted from 1/30-2/1 for an acute CVA.  She was discharged on ASA and Plavix. MRI revealed acute pontine CVA. Awaiting TEE.     PT Comments    Focus of session on stair training and safety with weak R LE.  Pt continues to benefit from out patient PT to address weakness and coordination impairments.  Will d/c home to mothers house today.     Follow Up Recommendations  Outpatient PT     Equipment Recommendations  None recommended by PT    Recommendations for Other Services       Precautions / Restrictions Precautions Precautions: Fall Precaution Comments: fell before presenting to ED Restrictions Weight Bearing Restrictions: No    Mobility  Bed Mobility               General bed mobility comments: Pt seated in recliner.  Transfers Overall transfer level: Needs assistance Equipment used: None Transfers: Sit to/from Stand Sit to Stand: Modified independent (Device/Increase time)         General transfer comment: Able to slide her flip flops on in standing.  Ambulation/Gait Ambulation/Gait assistance: Supervision Gait Distance (Feet): 200 Feet Assistive device: None Gait Pattern/deviations: Step-through pattern;Drifts right/left;Shuffle;Decreased dorsiflexion - right Gait velocity: decreased, able to increase with vc's   General Gait Details: Continue to drift R during gt training.  Pt with shuffling of R foot with poor foot clearance. Cues for R heel strike and foot clearance.   Stairs Stairs: Yes Stairs assistance: Min guard Stair  Management: Two rails;Forwards;Step to pattern Number of Stairs: 6 General stair comments: Cues for sequencing to regress to step to pattern due to R quad weakness.   Wheelchair Mobility    Modified Rankin (Stroke Patients Only) Modified Rankin (Stroke Patients Only) Pre-Morbid Rankin Score: Slight disability Modified Rankin: Moderate disability     Balance Overall balance assessment: Needs assistance Sitting-balance support: No upper extremity supported;Feet supported Sitting balance-Leahy Scale: Good       Standing balance-Leahy Scale: Good Standing balance comment: limitations evident, pt able to gain balance but taking increased time and concentration                            Cognition Arousal/Alertness: Awake/alert Behavior During Therapy: WFL for tasks assessed/performed Overall Cognitive Status: Within Functional Limits for tasks assessed                                        Exercises      General Comments        Pertinent Vitals/Pain Pain Assessment: No/denies pain Faces Pain Scale: No hurt    Home Living                      Prior Function            PT Goals (current goals can now be found in the care plan section) Acute Rehab PT Goals  PT Goal Formulation: With patient Additional Goals Additional Goal #1: Pt to score >19 on DGI Progress towards PT goals: Progressing toward goals    Frequency    Min 4X/week      PT Plan Current plan remains appropriate    Co-evaluation              AM-PAC PT "6 Clicks" Mobility   Outcome Measure  Help needed turning from your back to your side while in a flat bed without using bedrails?: None Help needed moving from lying on your back to sitting on the side of a flat bed without using bedrails?: None Help needed moving to and from a bed to a chair (including a wheelchair)?: None Help needed standing up from a chair using your arms (e.g., wheelchair or  bedside chair)?: None Help needed to walk in hospital room?: None Help needed climbing 3-5 steps with a railing? : A Little 6 Click Score: 23    End of Session Equipment Utilized During Treatment: Gait belt Activity Tolerance: Patient tolerated treatment well Patient left: in bed;with call bell/phone within reach Nurse Communication: Mobility status PT Visit Diagnosis: Other abnormalities of gait and mobility (R26.89)     Time: 1050-1104 PT Time Calculation (min) (ACUTE ONLY): 14 min  Charges:  $Gait Training: 8-22 mins                     Bonney Leitz , PTA Acute Rehabilitation Services Pager [phone] Office [phone]     Timoteo Carreiro Artis Delay 10/06/2019, 11:16 AM

## 2019-10-06 NOTE — Discharge Summary (Signed)
Physician Discharge Summary  Amy Raymond ZOX:[id number] DOB: 01/26/1966 DOA: 10/03/2019  PCP: [place], Julie M, NP  Admit date: 10/03/2019 Discharge date: 10/06/2019  Admitted From: Home Disposition:  Home   Recommendations for Outpatient Follow-up:  1. Follow up with PCP in 1 week 2. Follow up with Neurology in 4 weeks  3. Recommend 30 day cardioNet monitoring as outpatient to rule out A Fib  4. Recheck TSH and free T4 as outpatient in 6-8 weeks. Dose of synthroid decreased during hospitalization.   Discharge Condition: Stable CODE STATUS: Full Diet recommendation: Heart healthy    Brief/Interim Summary: DAJIAH KOOI is a 54 year old female with past medical history significant for type 2 diabetes, hypertension, hyperlipidemia, previous stroke who presented with weakness and fall.  She was admitted about 3 weeks ago for acute CVA, discharged at that time on aspirin and Plavix.  In the emergency room, MRI revealed acute pontine CVA and CTA head and neck revealed basilar artery stenosis.  Patient was admitted with neurology consult. She underwent cerebral arteriogram with IR 2/24 to evaluate for her basilar artery stenosis.   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 Atherosclerotic changes of the bilateral intracranial ICAs with approximately 54% stenosis at the paraclinoid level on the left. No significant stenosis on the right. -Recommend  30-day CardioNet monitoring as an outpatient to rule out A. Fib -Aspirin 325, Plavix, Lipitor -OP PT referral placed at time of discharge   Essential hypertension -Allow permissive hypertension in setting of acute stroke -Resume home meds   Hyperlipidemia -Continue Lipitor   Type 2 diabetes, poorly controlled with hyperglycemia -Continue sliding scale insulin  Hypothyroidism -Free T4 elevated 2.64, TSH low 0.087  -Continue Synthroid, reduced dose   Discharge Instructions  Discharge Instructions    Ambulatory referral to Neurology   Complete by: As directed    Follow up with stroke clinic NP (Jessica Vanschaick or Darrol Angel, if both not available, consider Manson Allan, or [facility]) at [facility] in about 4 weeks. Thanks.   Ambulatory referral to Occupational Therapy   Complete by: As directed    Ambulatory referral to Physical Therapy   Complete by: As directed    Ambulatory referral to Speech Therapy   Complete by: As directed    Call MD for:  difficulty breathing, headache or visual disturbances   Complete by: As directed    Call MD for:  extreme fatigue   Complete by: As directed    Call MD for:  persistant dizziness or light-headedness   Complete by: As directed    Call MD for:  persistant nausea and vomiting   Complete by: As directed    Call MD for:  severe uncontrolled pain   Complete by: As directed    Call MD for:  temperature >100.4   Complete by: As directed    Diet - low sodium heart healthy   Complete by: As directed    Discharge instructions   Complete by: As directed    You were cared for by  a hospitalist during your hospital stay. If you have any questions about your discharge medications or the care you received while you were in the hospital after you are discharged, you can call the unit and ask to speak with the hospitalist on call if the hospitalist that took care of you is not available. Once you are discharged, your primary care physician will  handle any further medical issues. Please note that NO REFILLS for any discharge medications will be authorized once you are discharged, as it is imperative that you return to your primary care physician (or establish a relationship with a primary care physician if you do not have one) for your aftercare needs so that they can reassess your need for medications and monitor your lab values.   Increase activity slowly   Complete by: As directed      Allergies as of 10/06/2019   No Known Allergies     Medication List    TAKE these medications   aspirin 325 MG EC tablet Take 1 tablet (325 mg total) by mouth daily. Start taking on: October 07, 2019 What changed:   medication strength  how much to take  additional instructions   atorvastatin 80 MG tablet Commonly known as: LIPITOR Take 1 tablet (80 mg total) by mouth daily at 6 PM. What changed:   medication strength  how much to take   cholecalciferol 25 MCG (1000 UNIT) tablet Commonly known as: VITAMIN D3 Take 1,000 Units by mouth daily.   clopidogrel 75 MG tablet Commonly known as: PLAVIX Take 1 tablet (75 mg total) by mouth daily.   ferrous sulfate 325 (65 FE) MG tablet Take 325 mg by mouth daily with breakfast.   glipiZIDE 10 MG tablet Commonly known as: GLUCOTROL Take 10 mg by mouth 2 (two) times daily.   Jentadueto 2.12-998 MG Tabs Generic drug: linaGLIPtin-metFORMIN HCl Take 1 tablet by mouth 2 (two) times daily.   levothyroxine 100 MCG tablet Commonly known as: SYNTHROID Take 1 tablet (100 mcg total) by mouth daily before breakfast. What changed:   medication strength  how much to take  when to take this   lisinopril 10 MG tablet Commonly known as: ZESTRIL Take 10 mg by mouth daily.   ondansetron 4 MG tablet Commonly known as: ZOFRAN Take 1 tablet (4 mg total) by mouth every 6 (six) hours as needed for nausea.      Follow-up Information    [facility].   Specialty: Rehabilitation Why: The outpatient therapy will contact you for the first appointment Contact information: [address] [id number] mc [place] [address] [phone]       [facility]. Schedule an appointment as soon as possible for a visit in 4 week(s).   Specialty: Neurology Contact information: [address] [place] [address] [phone]       [place], Julie M, NP. Schedule an appointment as soon as possible for a visit in 1 week(s).   Specialty: Nurse Practitioner Contact information: [address] [place] [address] [place] [address] [phone]          No Known Allergies  Consultations:  Neurology  IR    Procedures/Studies: EEG  Result Date: 09/11/2019 Charlsie Quest, MD     09/11/2019 11:39 AM Patient Name: NEVAH DALAL MRN: [id number] Epilepsy Attending: Charlsie Quest Referring Physician/Provider: Dr Milon Dikes Date: 09/11/2019 Duration: 23.34 mins Patient history: 54 y.o. female with past medical  history significant for diabetes mellitus, thyroid disease and migraines with visual aura presents to the emergency room with sudden onset inability to get words out followed by dizziness and facial numbness.  She had a previous episode of bilateral vision loss. Then had an episode of staring spell. EEG to assess for seizure. Level of alertness: Awake AEDs during EEG study: None Technical aspects: This EEG study was done with scalp electrodes positioned according to the 10-20 International system of electrode placement. Electrical activity was acquired at a sampling rate of 500Hz  and reviewed with a high frequency filter of 70Hz  and a low frequency filter of 1Hz . EEG data were recorded continuously and digitally stored. DESCRIPTION: The posterior dominant rhythm consists of 9-10 Hz activity of moderate voltage (25-35 uV) seen predominantly in posterior head  regions, symmetric and reactive to eye opening and eye closing. Physiologic photic driving was seen during photic stimulation. Hyperventilation was not performed due to acute CVA. IMPRESSION: This study is within normal limits. No seizures or epileptiform discharges were seen throughout the recording. Charlsie Quest   CT Angio Head W or Wo Contrast  Result Date: 10/03/2019 CLINICAL DATA:  Found unresponsive. EXAM: CT ANGIOGRAPHY HEAD AND NECK TECHNIQUE: Multidetector CT imaging of the head and neck was performed using the standard protocol during bolus administration of intravenous contrast. Multiplanar CT image reconstructions and MIPs were obtained to evaluate the vascular anatomy. Carotid stenosis measurements (when applicable) are obtained utilizing NASCET criteria, using the distal internal carotid diameter as the denominator. CONTRAST:  75mL OMNIPAQUE IOHEXOL 350 MG/ML SOLN COMPARISON:  09/10/2019 FINDINGS: CT HEAD FINDINGS Brain: Low-density in the ventral midline pons, new. No acute hemorrhage, hydrocephalus, or masslike finding. Vascular: See below Skull: Negative Sinuses: Clear Orbits: Negative Review of the MIP images confirms the above findings CTA NECK FINDINGS Aortic arch: 2 vessel arch.  No acute finding Right carotid system: Atheromatous wall thickening of the common carotid and likely of the bulb. No flow limiting stenosis or ulceration. Left carotid system: Low-density atheromatous wall thickening of the common carotid and likely of the bulb. No ulceration flow limiting stenosis Vertebral arteries: No proximal subclavian stenosis. The non dominant left vertebral artery shows thready flow with no meaningful patency at the level of the dura. Right vertebral artery is smoothly contoured and patent, partially obscured by intravenous contrast at the V1 segment. Skeleton: Cervical disc degeneration. Other neck: No acute or aggressive finding. Upper chest: Negative Review of the MIP images confirms  the above findings CTA HEAD FINDINGS Anterior circulation: Atherosclerotic plaque at the carotid siphons with left more than right stenosis. At least 75% narrowing is seen at the posterior and anterior genu on the left. On the right at the posterior genu there is 60% stenosis. No branch occlusion or beading. Negative for aneurysm. Posterior circulation: No meaningful flow in the left vertebral artery, chronic. The right V4 segment is diffusely patent. Chronic moderate narrowing of the basilar distally. No branch occlusion or beading. Negative for aneurysm Venous sinuses: Patent Anatomic variants: Negative Review of the MIP images confirms the above findings IMPRESSION: 1. Low-density in the ventral pons that is new from January 2021. History suggests acute infarct but this would have an atypical central location. Recommend brain MRI 2. No emergent vascular finding. 3. 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 4. Severe left cavernous ICA stenosis. 60% narrowing of the right cavernous ICA. Electronically Signed   By: Marnee Spring M.D.   On:  10/03/2019 06:12   CT Angio Head W or Wo Contrast  Result Date: 09/11/2019 CLINICAL DATA:  Initial evaluation for acute headache, syncope. EXAM: CT ANGIOGRAPHY HEAD AND NECK TECHNIQUE: Multidetector CT imaging of the head and neck was performed using the standard protocol during bolus administration of intravenous contrast. Multiplanar CT image reconstructions and MIPs were obtained to evaluate the vascular anatomy. Carotid stenosis measurements (when applicable) are obtained utilizing NASCET criteria, using the distal internal carotid diameter as the denominator. CONTRAST:  75mL OMNIPAQUE IOHEXOL 350 MG/ML SOLN COMPARISON:  Prior noncontrast head CT from earlier the same day. FINDINGS: CTA NECK FINDINGS Aortic arch: Visualized aortic arch of normal caliber with normal branch pattern. No hemodynamically significant  stenosis seen about the origin of the great vessels. Visualized subclavian arteries widely patent. Right carotid system: Right common and internal carotid arteries widely patent without stenosis, dissection, or occlusion. Left carotid system: Mild eccentric soft plaque within the mid left CCA with no more than mild stenosis. Left common and internal carotid arteries otherwise widely patent without stenosis, dissection or occlusion. Vertebral arteries: Both vertebral arteries arise from the subclavian arteries. Right vertebral artery dominant and widely patent within the neck. Left vertebral artery diffusely hypoplastic and largely occluded at its origin. Attenuated and irregular distal reconstitution at the left V2 segment, with irregular attenuated flow distally within the neck. Left vertebral is grossly patent as it courses into the skull base. Skeleton: No acute osseous abnormality. No discrete or worrisome osseous lesions. Mild cervical spondylosis at C3-4 through C5-6. Other neck: No other acute soft tissue abnormality within the neck. No mass lesion or adenopathy. Upper chest: Multifocal patchy parenchymal opacity seen within the peripheral upper lobes bilaterally, right greater than left, concerning for multifocal pneumonia. Visualized upper chest demonstrates no other acute finding. Review of the MIP images confirms the above findings CTA HEAD FINDINGS Anterior circulation: Petrous segments widely patent. Multifocal atheromatous plaque throughout the cavernous/supraclinoid ICAs bilaterally. Associated up to moderate approximate 50% stenosis at the cavernous right ICA (series 13, image 429). On the left, there is resultant moderate to severe multifocal stenoses (series 13, image 428). A1 segments patent bilaterally. Normal anterior communicating artery. Anterior cerebral arteries patent to their distal aspects without stenosis. No M1 stenosis or occlusion. Normal MCA bifurcations. Distal MCA branches well  perfused and symmetric. Posterior circulation: Dominant right vertebral artery patent to the vertebrobasilar junction without stenosis. Patent right PICA. Left vertebral artery attenuated but patent at the skull base, but subsequently occludes, and remains occluded to the vertebrobasilar junction. Left PICA not seen. Moderate diffuse stenosis noted at the mid basilar artery (series 13, image 430). Superior cerebral arteries patent bilaterally. Both PCAs primarily supplied via the basilar and are well perfused to their distal aspects. Small right posterior communicating artery noted. Venous sinuses: Patent. 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 No appreciable intracranial aneurysm. Review of the MIP images confirms the above findings IMPRESSION: 1. Negative CTA for emergent large vessel occlusion. 2. 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 Dominant right vertebral artery remains widely patent. 3. Additional moderate diffuse atheromatous narrowing of the mid basilar artery. 4. Multifocal atheromatous change throughout the carotid siphons, with associated moderate to severe multifocal narrowing on the left and more moderate narrowing on the right. Both carotid artery systems remain widely patent within the neck. 5. Multifocal parenchymal opacities within the visualized lungs, consistent  with multifocal pneumonia. Electronically Signed   By: Rise Mu M.D.   On: 09/11/2019 00:15   CT Head Wo Contrast  Result Date: 09/10/2019 CLINICAL DATA:  Found unresponsive. EXAM: CT HEAD WITHOUT CONTRAST TECHNIQUE: Contiguous axial images were obtained from the base of the skull through the vertex without intravenous contrast. COMPARISON:  May 31, 2012 FINDINGS: Brain: No evidence of acute infarction, hemorrhage, hydrocephalus,  extra-axial collection or mass lesion/mass effect. Vascular: No hyperdense vessel or unexpected calcification. Skull: Normal. Negative for fracture or focal lesion. Sinuses/Orbits: No acute finding. Other: None. IMPRESSION: No acute intracranial pathology. Electronically Signed   By: Aram Candela M.D.   On: 09/10/2019 22:28   CT Angio Neck W and/or Wo Contrast  Result Date: 10/03/2019 CLINICAL DATA:  Found unresponsive. EXAM: CT ANGIOGRAPHY HEAD AND NECK TECHNIQUE: Multidetector CT imaging of the head and neck was performed using the standard protocol during bolus administration of intravenous contrast. Multiplanar CT image reconstructions and MIPs were obtained to evaluate the vascular anatomy. Carotid stenosis measurements (when applicable) are obtained utilizing NASCET criteria, using the distal internal carotid diameter as the denominator. CONTRAST:  43mL OMNIPAQUE IOHEXOL 350 MG/ML SOLN COMPARISON:  09/10/2019 FINDINGS: CT HEAD FINDINGS Brain: Low-density in the ventral midline pons, new. No acute hemorrhage, hydrocephalus, or masslike finding. Vascular: See below Skull: Negative Sinuses: Clear Orbits: Negative Review of the MIP images confirms the above findings CTA NECK FINDINGS Aortic arch: 2 vessel arch.  No acute finding Right carotid system: Atheromatous wall thickening of the common carotid and likely of the bulb. No flow limiting stenosis or ulceration. Left carotid system: Low-density atheromatous wall thickening of the common carotid and likely of the bulb. No ulceration flow limiting stenosis Vertebral arteries: No proximal subclavian stenosis. The non dominant left vertebral artery shows thready flow with no meaningful patency at the level of the dura. Right vertebral artery is smoothly contoured and patent, partially obscured by intravenous contrast at the V1 segment. Skeleton: Cervical disc degeneration. Other neck: No acute or aggressive finding. Upper chest: Negative Review of the MIP  images confirms the above findings CTA HEAD FINDINGS Anterior circulation: Atherosclerotic plaque at the carotid siphons with left more than right stenosis. At least 75% narrowing is seen at the posterior and anterior genu on the left. On the right at the posterior genu there is 60% stenosis. No branch occlusion or beading. Negative for aneurysm. Posterior circulation: No meaningful flow in the left vertebral artery, chronic. The right V4 segment is diffusely patent. Chronic moderate narrowing of the basilar distally. No branch occlusion or beading. Negative for aneurysm Venous sinuses: Patent Anatomic variants: Negative Review of the MIP images confirms the above findings IMPRESSION: 1. Low-density in the ventral pons that is new from January 2021. History suggests acute infarct but this would have an atypical central location. Recommend brain MRI 2. No emergent vascular finding. 3. 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 4. Severe left cavernous ICA stenosis. 60% narrowing of the right cavernous ICA. Electronically Signed   By: Marnee Spring M.D.   On: 10/03/2019 06:12   CT Angio Neck W and/or Wo Contrast  Result Date: 09/11/2019 CLINICAL DATA:  Initial evaluation for acute headache, syncope. EXAM: CT ANGIOGRAPHY HEAD AND NECK TECHNIQUE: Multidetector CT imaging of the head and neck was performed using the standard protocol during bolus administration of intravenous contrast. Multiplanar CT image reconstructions and MIPs were obtained to evaluate the vascular anatomy. Carotid stenosis measurements (  when applicable) are obtained utilizing NASCET criteria, using the distal internal carotid diameter as the denominator. CONTRAST:  75mL OMNIPAQUE IOHEXOL 350 MG/ML SOLN COMPARISON:  Prior noncontrast head CT from earlier the same day. FINDINGS: CTA NECK FINDINGS Aortic arch: Visualized aortic arch of normal caliber with normal branch pattern. No hemodynamically  significant stenosis seen about the origin of the great vessels. Visualized subclavian arteries widely patent. Right carotid system: Right common and internal carotid arteries widely patent without stenosis, dissection, or occlusion. Left carotid system: Mild eccentric soft plaque within the mid left CCA with no more than mild stenosis. Left common and internal carotid arteries otherwise widely patent without stenosis, dissection or occlusion. Vertebral arteries: Both vertebral arteries arise from the subclavian arteries. Right vertebral artery dominant and widely patent within the neck. Left vertebral artery diffusely hypoplastic and largely occluded at its origin. Attenuated and irregular distal reconstitution at the left V2 segment, with irregular attenuated flow distally within the neck. Left vertebral is grossly patent as it courses into the skull base. Skeleton: No acute osseous abnormality. No discrete or worrisome osseous lesions. Mild cervical spondylosis at C3-4 through C5-6. Other neck: No other acute soft tissue abnormality within the neck. No mass lesion or adenopathy. Upper chest: Multifocal patchy parenchymal opacity seen within the peripheral upper lobes bilaterally, right greater than left, concerning for multifocal pneumonia. Visualized upper chest demonstrates no other acute finding. Review of the MIP images confirms the above findings CTA HEAD FINDINGS Anterior circulation: Petrous segments widely patent. Multifocal atheromatous plaque throughout the cavernous/supraclinoid ICAs bilaterally. Associated up to moderate approximate 50% stenosis at the cavernous right ICA (series 13, image 429). On the left, there is resultant moderate to severe multifocal stenoses (series 13, image 428). A1 segments patent bilaterally. Normal anterior communicating artery. Anterior cerebral arteries patent to their distal aspects without stenosis. No M1 stenosis or occlusion. Normal MCA bifurcations. Distal MCA  branches well perfused and symmetric. Posterior circulation: Dominant right vertebral artery patent to the vertebrobasilar junction without stenosis. Patent right PICA. Left vertebral artery attenuated but patent at the skull base, but subsequently occludes, and remains occluded to the vertebrobasilar junction. Left PICA not seen. Moderate diffuse stenosis noted at the mid basilar artery (series 13, image 430). Superior cerebral arteries patent bilaterally. Both PCAs primarily supplied via the basilar and are well perfused to their distal aspects. Small right posterior communicating artery noted. Venous sinuses: Patent. 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 No appreciable intracranial aneurysm. Review of the MIP images confirms the above findings IMPRESSION: 1. Negative CTA for emergent large vessel occlusion. 2. 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 Dominant right vertebral artery remains widely patent. 3. Additional moderate diffuse atheromatous narrowing of the mid basilar artery. 4. Multifocal atheromatous change throughout the carotid siphons, with associated moderate to severe multifocal narrowing on the left and more moderate narrowing on the right. Both carotid artery systems remain widely patent within the neck. 5. Multifocal parenchymal opacities within the visualized lungs, consistent with multifocal pneumonia. Electronically Signed   By: Rise Mu M.D.   On: 09/11/2019 00:15   CT CHEST WO CONTRAST  Result Date: 09/11/2019 CLINICAL DATA:  Pneumonia EXAM: CT CHEST WITHOUT CONTRAST TECHNIQUE: Multidetector CT imaging of the chest was performed following the standard protocol without IV contrast. COMPARISON:  Radiography same day FINDINGS: Cardiovascular: Heart size is normal. There is some coronary  artery calcification. There is some aortic atherosclerotic calcification. No sign of aneurysm. No pericardial fluid. Mediastinum/Nodes: No mass or lymphadenopathy seen on this noncontrast study. Lungs/Pleura: Widely dispersed patchy pulmonary infiltrates with a somewhat peripheral prominent distribution, typical of infiltrates seen with coronavirus infection. No lobar consolidation or collapse. No pleural effusion. No cavitation. Upper Abdomen: Multiple bilateral nonobstructing renal calculi. Probable gallstones. Musculoskeletal: Negative IMPRESSION: Widely dispersed patchy pulmonary infiltrates typical of those seen in coronavirus infection. No lobar consolidation or collapse. Multiple nonobstructing renal calculi. Probable gallstones. Aortic atherosclerosis.  Coronary artery calcification. Electronically Signed   By: Paulina Fusi M.D.   On: 09/11/2019 08:44   MR BRAIN WO CONTRAST  Result Date: 10/03/2019 CLINICAL DATA:  Recurrent falls. Weakness. Abnormality in the pons on CT. EXAM: MRI HEAD WITHOUT CONTRAST TECHNIQUE: Multiplanar, multiecho pulse sequences of the brain and surrounding structures were obtained without intravenous contrast. COMPARISON:  Head CT/CTA 10/03/2019 and MRI 09/11/2019 FINDINGS: Brain: There is a 15 x 12 mm acute infarct in the ventral pons located within and to the left of the midline. Punctate foci of diffusion weighted signal abnormality in the posterior cerebral hemispheres on the prior MRI have resolved. Small foci of T2 hyperintensity in the cerebral white matter bilaterally are unchanged and nonspecific but compatible with mildly age advanced chronic small vessel ischemic disease. A chronic lacunar infarct is again noted in the left thalamus. The ventricles and sulci are within normal limits for age. No intracranial hemorrhage, mass, midline shift, or extra-axial fluid collection is identified. Vascular: Unchanged abnormal appearance of the distal left vertebral artery with only  thready intermittent flow shown on CTA. Skull and upper cervical spine: Unremarkable bone marrow signal. Sinuses/Orbits: Unremarkable orbits. Paranasal sinuses and mastoid air cells are clear. Other: None. IMPRESSION: 1. Acute pontine infarct. 2. Mild chronic small vessel ischemic disease. Electronically Signed   By: Sebastian Ache M.D.   On: 10/03/2019 07:54   MR BRAIN WO CONTRAST  Result Date: 09/11/2019 CLINICAL DATA:  Initial evaluation for acute headache, bilateral visual loss, syncope. EXAM: MRI HEAD WITHOUT CONTRAST TECHNIQUE: Multiplanar, multiecho pulse sequences of the brain and surrounding structures were obtained without intravenous contrast. COMPARISON:  Prior CT and CTA from 09/10/2019. FINDINGS: Brain: Generalized age-related cerebral atrophy. Patchy T2/FLAIR hyperintensities seen within the periventricular, deep, and subcortical white matter both cerebral hemispheres, nonspecific, but most like related chronic microvascular ischemic disease, mild in nature. Superimposed remote lacunar infarct present within the left thalamus. Subtle patchy diffusion abnormality seen involving the cortical and subcortical aspect of the bilateral parieto-occipital regions (series 5, image 72, 66 on axial DWI sequence, series 7, image 37 on coronal DWI sequence, suspicious for tiny acute ischemic infarcts. ADC correlate difficult to visualize given small size. No associated hemorrhage. No other evidence for acute or subacute ischemia. Gray-white matter differentiation otherwise maintained. No foci of susceptibility artifact to suggest acute or chronic intracranial hemorrhage. No mass lesion, midline shift or mass effect. No hydrocephalus. No extra-axial fluid collection. Pituitary gland suprasellar region normal. Midline structures intact. Vascular: Loss of normal flow void within the hypoplastic left vertebral artery, consistent with previously identified occlusion. Major intracranial vascular flow voids otherwise  maintained. Skull and upper cervical spine: Craniocervical junction within normal limits. Bone marrow signal intensity normal. No scalp soft tissue abnormality. Sinuses/Orbits: Globes and orbital soft tissues within normal limits. Mild scattered mucosal thickening noted within the ethmoidal air cells. Paranasal sinuses are otherwise clear. No mastoid effusion. Inner ear structures grossly normal. Other: None. IMPRESSION: 1. Few  tiny punctate foci of diffusion abnormality involving the parieto-occipital regions bilaterally as above, suspicious for tiny acute ischemic infarcts given patient's symptoms. No associated hemorrhage or mass effect. 2. Underlying age-related cerebral atrophy with chronic microvascular ischemic disease, with additional chronic left thalamic lacunar infarct. Electronically Signed   By: Rise MuBenjamin  McClintock M.D.   On: 09/11/2019 02:17   CT ABDOMEN PELVIS W CONTRAST  Result Date: 09/12/2019 CLINICAL DATA:  Flank pain. EXAM: CT ABDOMEN AND PELVIS WITH CONTRAST TECHNIQUE: Multidetector CT imaging of the abdomen and pelvis was performed using the standard protocol following bolus administration of intravenous contrast. CONTRAST:  100mL OMNIPAQUE IOHEXOL 300 MG/ML  SOLN COMPARISON:  None. FINDINGS: Lower chest: Numerous small patchy infiltrates are seen scattered throughout the bilateral lung bases. Hepatobiliary: No focal liver abnormality is seen. Mild diffuse fatty infiltration of the liver is noted. A well-defined 1.9 cm x 1.4 cm area of low-attenuation is seen within the lumen of an otherwise normal-appearing gallbladder (axial CT image 34, CT series number 3). Pancreas: Unremarkable. No pancreatic ductal dilatation or surrounding inflammatory changes. Spleen: Normal in size without focal abnormality. Adrenals/Urinary Tract: Adrenal glands are unremarkable. Kidneys are normal, without renal calculi, focal lesion, or hydronephrosis. Bladder is unremarkable. Stomach/Bowel: Stomach is within  normal limits. Appendix appears normal. No evidence of bowel wall thickening, distention, or inflammatory changes. Vascular/Lymphatic: Mild aortic atherosclerosis. No enlarged abdominal or pelvic lymph nodes. Reproductive: Uterus and bilateral adnexa are unremarkable. Other: A small amount air is seen within the subcutaneous fat along the anterior aspect of the lower pelvic wall on the left. Musculoskeletal: No acute or significant osseous findings. IMPRESSION: 1. Fatty liver. 2. Cholelithiasis, without evidence of acute cholecystitis. 3. Mild to moderate severity bibasilar infiltrates. Electronically Signed   By: Aram Candelahaddeus  Houston M.D.   On: 09/12/2019 20:50   IR US Guide Vasc Access Right  Result Date: 10/05/2019 de Glori LuisMacedo Rodrigues, Katyucia, MD     10/05/2019  2:39 PM Diagnostic cerebral angiogram Operator: Dr. Baldemar LenisKatyucia de Macedo Rodrigues Indication: Basilar artery stenosis Access: Right distal radial artery Sedation: 100 mcg fentanyl; 2 mg midazolan Contrast: 55 mL Omnipaque 240 Findings: There is mild stenosis of the mid basilar artery with no flow limitation. Atherosclerotic changes of the bilateral intracranial ICAs with approximately 54% stenosis at the paraclinoid level on the left. No significant stenosis on the right. Blood loss: minimal. Complications: None. Patient tolerated the procedure well and is stable.   DG Chest Port 1 View  Result Date: 09/11/2019 CLINICAL DATA:  Pulmonary opacities on recent neck CT. COVID exposure. EXAM: PORTABLE CHEST 1 VIEW COMPARISON:  CT neck 09/10/2019; chest radiograph 05/31/2012 FINDINGS: Monitoring leads overlie the patient. Stable cardiac and mediastinal contours. Patchy bilateral airspace opacities are demonstrated. No pleural effusion or pneumothorax. IMPRESSION: Patchy bilateral airspace opacities most compatible with history of COVID-19. Electronically Signed   By: Annia Beltrew  Davis M.D.   On: 09/11/2019 04:12   ECHO TEE  Result Date: 10/04/2019     TRANSESOPHOGEAL ECHO REPORT   Patient Name:   Arabella MerlesLAURA A Creech Date of Exam: 10/04/2019 Medical Rec #:  [id number]       Height:       64.0 in Accession #:    [id number]      Weight:       169.8 lb Date of Birth:  Feb 20, 1966       BSA:          1.825 m Patient Age:    [age] years  BP:           188/62 mmHg Patient Gender: F               HR:           87 bpm. Exam Location:  Inpatient Procedure: 2D Echo, Transesophageal Echo and Saline Contrast Bubble Study Indications:     stroke  History:         Patient has prior history of Echocardiogram examinations, most                  recent 09/11/2019.  Sonographer:     Delcie Roch Referring Phys:  [id number] Manson Passey Diagnosing Phys: Chilton Si MD PROCEDURE: The transesophogeal probe was passed without difficulty through the esophogus of the patient. Local oropharyngeal anesthetic was provided with Cetacaine. Sedation performed by performing physician. Patients was under conscious sedation during this procedure. Anesthetic administered: of Fentanyl, 5.0mg  of Versed. The patient's vital signs; including heart rate, blood pressure, and oxygen saturation; remained stable throughout the procedure. The patient developed no complications during the procedure. IMPRESSIONS  1. Left ventricular ejection fraction, by estimation, is 60 to 65%. The left ventricle has normal function. The left ventricle has no regional wall motion abnormalities. Left ventricular diastolic function could not be evaluated.  2. Right ventricular systolic function is normal. The right ventricular size is normal. There is normal pulmonary artery systolic pressure.  3. No left atrial/left atrial appendage thrombus was detected.  4. The mitral valve is normal in structure and function. Trivial mitral valve regurgitation. No evidence of mitral stenosis.  5. The aortic valve is tricuspid. Aortic valve regurgitation is trivial. No aortic stenosis is present.  6. The inferior vena cava is  normal in size with greater than 50% respiratory variability, suggesting right atrial pressure of 3 mmHg.  7. Agitated saline contrast bubble study was negative, with no evidence of any interatrial shunt. Conclusion(s)/Recommendation(s): Normal biventricular function without evidence of hemodynamically significant valvular heart disease. FINDINGS  Left Ventricle: Left ventricular ejection fraction, by estimation, is 60 to 65%. The left ventricle has normal function. The left ventricle has no regional wall motion abnormalities. The left ventricular internal cavity size was normal in size. There is  no left ventricular hypertrophy. Right Ventricle: The right ventricular size is normal. No increase in right ventricular wall thickness. Right ventricular systolic function is normal. There is normal pulmonary artery systolic pressure. The tricuspid regurgitant velocity is 1.91 m/s, and  with an assumed right atrial pressure of 0 mmHg, the estimated right ventricular systolic pressure is 14.6 mmHg. Left Atrium: Left atrial size was normal in size. No left atrial/left atrial appendage thrombus was detected. Right Atrium: Right atrial size was normal in size. Pericardium: There is no evidence of pericardial effusion. Mitral Valve: The mitral valve is normal in structure and function. Normal mobility of the mitral valve leaflets. Trivial mitral valve regurgitation. No evidence of mitral valve stenosis. Tricuspid Valve: The tricuspid valve is normal in structure. Tricuspid valve regurgitation is mild . No evidence of tricuspid stenosis. Aortic Valve: The aortic valve is tricuspid. Aortic valve regurgitation is trivial. No aortic stenosis is present. Pulmonic Valve: The pulmonic valve was normal in structure. Pulmonic valve regurgitation is not visualized. No evidence of pulmonic stenosis. Aorta: The aortic root is normal in size and structure. Venous: The inferior vena cava is normal in size with greater than 50% respiratory  variability, suggesting right atrial pressure of 3 mmHg. IAS/Shunts: No atrial level shunt  detected by color flow Doppler. Agitated saline contrast was given intravenously to evaluate for intracardiac shunting. Agitated saline contrast bubble study was negative, with no evidence of any interatrial shunt. There  is no evidence of a patent foramen ovale. There is no evidence of an atrial septal defect.  TRICUSPID VALVE TR Peak grad:   14.6 mmHg TR Vmax:        191.00 cm/s Chilton Si MD Electronically signed by Chilton Si MD Signature Date/Time: 10/04/2019/4:03:07 PM    Final    ECHOCARDIOGRAM COMPLETE BUBBLE STUDY  Result Date: 09/11/2019   ECHOCARDIOGRAM REPORT   Patient Name:   JUDITHANN VILLAMAR Date of Exam: 09/11/2019 Medical Rec #:  [id number]       Height:       62.0 in Accession #:    [id number]      Weight:       178.1 lb Date of Birth:  [birth year]       BSA:          1.82 m Patient Age:    53 years        BP:           131/74 mmHg Patient Gender: F               HR:           94 bpm. Exam Location:  Inpatient Portions of this table do not appear on this page. Procedure: 2D Echo Indications:    PFO  History:        Patient has no prior history of Echocardiogram examinations.                 Risk Factors:Diabetes.  Sonographer:    Celene Skeen RDCS (AE) Referring Phys: [id number] MIRCEA G CRISTESCU IMPRESSIONS  1. Left ventricular ejection fraction, by visual estimation, is 60 to 65%. The left ventricle has normal function. There is mildly increased left ventricular hypertrophy.  2. The left ventricle has no regional wall motion abnormalities.  3. Global right ventricle has normal systolic function.The right ventricular size is normal. No increase in right ventricular wall thickness.  4. Left atrial size was normal.  5. Right atrial size was normal.  6. The mitral valve is abnormal. Trivial mitral valve regurgitation.  7. The tricuspid valve is normal in structure.  8. The tricuspid valve is normal in  structure. Tricuspid valve regurgitation is trivial.  9. The aortic valve is tricuspid. Aortic valve regurgitation is not visualized. Mild aortic valve sclerosis without stenosis. 10. Pulmonic regurgitation is mild. 11. The pulmonic valve was normal in structure. Pulmonic valve regurgitation is mild. 12. The inferior vena cava is normal in size with greater than 50% respiratory variability, suggesting right atrial pressure of 3 mmHg. FINDINGS  Left Ventricle: Left ventricular ejection fraction, by visual estimation, is 60 to 65%. The left ventricle has normal function. The left ventricle has no regional wall motion abnormalities. The left ventricular internal cavity size was the left ventricle is normal in size. There is mildly increased left ventricular hypertrophy. Right Ventricle: The right ventricular size is normal. No increase in right ventricular wall thickness. Global RV systolic function is has normal systolic function. Left Atrium: Left atrial size was normal in size. Right Atrium: Right atrial size was normal in size Pericardium: There is no evidence of pericardial effusion. Mitral Valve: The mitral valve is abnormal. There is mild thickening of the mitral valve leaflet(s). Trivial mitral valve regurgitation. Tricuspid Valve: The tricuspid valve  is normal in structure. Tricuspid valve regurgitation is trivial. Aortic Valve: The aortic valve is tricuspid. Aortic valve regurgitation is not visualized. Mild aortic valve sclerosis is present, with no evidence of aortic valve stenosis. Pulmonic Valve: The pulmonic valve was normal in structure. Pulmonic valve regurgitation is mild. Pulmonic regurgitation is mild. Aorta: The aortic root is normal in size and structure. Venous: The inferior vena cava is normal in size with greater than 50% respiratory variability, suggesting right atrial pressure of 3 mmHg. IAS/Shunts: No atrial level shunt detected by color flow Doppler.  LEFT VENTRICLE PLAX 2D LVIDd:          2.50 cm  Diastology LVIDs:         1.80 cm  LV e' lateral:   10.70 cm/s LV PW:         1.20 cm  LV E/e' lateral: 5.9 LV IVS:        1.30 cm LVOT diam:     2.00 cm LV SV:         13 ml LV SV Index:   6.59 LVOT Area:     3.14 cm  RIGHT VENTRICLE RV S prime:     12.70 cm/s TAPSE (M-mode): 1.9 cm LEFT ATRIUM             Index       RIGHT ATRIUM          Index LA diam:        3.00 cm 1.65 cm/m  RA Area:     7.57 cm LA Vol (A2C):   22.5 ml 12.36 ml/m RA Volume:   11.50 ml 6.32 ml/m LA Vol (A4C):   18.2 ml 10.00 ml/m LA Biplane Vol: 21.1 ml 11.59 ml/m  AORTIC VALVE LVOT Vmax:   69.20 cm/s LVOT Vmean:  55.400 cm/s LVOT VTI:    0.120 m  AORTA Ao Root diam: 3.30 cm MITRAL VALVE MV Area (PHT): 1.75 cm             SHUNTS MV PHT:        125.57 msec          Systemic VTI:  0.12 m MV Decel Time: 433 msec             Systemic Diam: 2.00 cm MV E velocity: 63.00 cm/s 103 cm/s MV A velocity: 96.40 cm/s 70.3 cm/s MV E/A ratio:  0.65       1.5  Dietrich Pates MD Electronically signed by Dietrich Pates MD Signature Date/Time: 09/11/2019/4:23:28 PM    Final    VAS [place] LOWER EXTREMITY VENOUS (DVT)  Result Date: 09/12/2019  Lower Venous Study Indications: Stroke.  Risk Factors: DM. Limitations: Poor ultrasound/tissue interface. Comparison Study: No prior exam. Performing Technologist: Kennedy Bucker ARDMS, RVT  Examination Guidelines: A complete evaluation includes B-mode imaging, spectral Doppler, color Doppler, and power Doppler as needed of all accessible portions of each vessel. Bilateral testing is considered an integral part of a complete examination. Limited examinations for reoccurring indications may be performed as noted.  +---------+---------------+---------+-----------+----------+--------------+ RIGHT    CompressibilityPhasicitySpontaneityPropertiesThrombus Aging +---------+---------------+---------+-----------+----------+--------------+ CFV      Full           Yes      Yes                                  +---------+---------------+---------+-----------+----------+--------------+ SFJ      Full                                                        +---------+---------------+---------+-----------+----------+--------------+  FV Prox  Full                                                        +---------+---------------+---------+-----------+----------+--------------+ FV Mid   Full                                                        +---------+---------------+---------+-----------+----------+--------------+ FV DistalFull                                                        +---------+---------------+---------+-----------+----------+--------------+ PFV      Full                                                        +---------+---------------+---------+-----------+----------+--------------+ POP      Full           Yes      Yes                                 +---------+---------------+---------+-----------+----------+--------------+ PTV      Full                                                        +---------+---------------+---------+-----------+----------+--------------+ PERO     Full                                                        +---------+---------------+---------+-----------+----------+--------------+ Hypoechoic, oval structure with echogenic center seen in right groin measuring 3.8 x 1.7 x 0.8 cm.  +---------+---------------+---------+-----------+----------+--------------+ LEFT     CompressibilityPhasicitySpontaneityPropertiesThrombus Aging +---------+---------------+---------+-----------+----------+--------------+ CFV      Full           Yes      Yes                                 +---------+---------------+---------+-----------+----------+--------------+ SFJ      Full                                                        +---------+---------------+---------+-----------+----------+--------------+ FV Prox  Full                                                         +---------+---------------+---------+-----------+----------+--------------+  FV Mid   Full                                                        +---------+---------------+---------+-----------+----------+--------------+ FV DistalFull                                                        +---------+---------------+---------+-----------+----------+--------------+ PFV      Full                                                        +---------+---------------+---------+-----------+----------+--------------+ POP      Full           Yes      Yes                                 +---------+---------------+---------+-----------+----------+--------------+ PTV      Full                                                        +---------+---------------+---------+-----------+----------+--------------+ PERO     Full                                                        +---------+---------------+---------+-----------+----------+--------------+ Poorly visualized bilteral calf veins due to tissue properties.    Summary: Right: There is no evidence of deep vein thrombosis in the lower extremity. No cystic structure found in the popliteal fossa. Hypoechoic, oval structure with echogenic center seen in right groin measuring 3.8 x 1.7 x 0.8 cm. Left: There is no evidence of deep vein thrombosis in the lower extremity. No cystic structure found in the popliteal fossa.  *See table(s) above for measurements and observations. Electronically signed by Fabienne Bruns MD on 09/12/2019 at 5:11:30 PM.    Final    US Abdomen Limited RUQ  Result Date: 09/11/2019 CLINICAL DATA:  Transaminitis. EXAM: ULTRASOUND ABDOMEN LIMITED RIGHT UPPER QUADRANT COMPARISON:  None. FINDINGS: Gallbladder: Single mobile 1.6 cm gallstone is present. No evidence of gallbladder wall thickening or pericholecystic fluid. Negative sonographic Murphy sign. Common bile duct:  Diameter: 3.4 mm. Liver: Mild increased echogenicity without focal mass. Portal vein is patent on color Doppler imaging with normal direction of blood flow towards the liver. Other: None. IMPRESSION: 1. Single 1.6 cm mobile gallstone. No additional findings to suggest acute cholecystitis. 2.  Suggestion of mild hepatic steatosis.  No focal mass. Electronically Signed   By: Elberta Fortis M.D.   On: 09/11/2019 09:16   IR ANGIO INTRA EXTRACRAN SEL INTERNAL CAROTID BILAT MOD SED  Result Date: 10/05/2019 de Glori Luis, MD  10/05/2019  2:39 PM Diagnostic cerebral angiogram Operator: Dr. Baldemar Lenis Indication: Basilar artery stenosis Access: Right distal radial artery Sedation: 100 mcg fentanyl; 2 mg midazolan Contrast: 55 mL Omnipaque 240 Findings: There is mild stenosis of the mid basilar artery with no flow limitation. Atherosclerotic changes of the bilateral intracranial ICAs with approximately 54% stenosis at the paraclinoid level on the left. No significant stenosis on the right. Blood loss: minimal. Complications: None. Patient tolerated the procedure well and is stable.   IR ANGIO VERTEBRAL SEL VERTEBRAL UNI R MOD SED  Result Date: 10/05/2019 de Glori Luis, MD     10/05/2019  2:39 PM Diagnostic cerebral angiogram Operator: Dr. Baldemar Lenis Indication: Basilar artery stenosis Access: Right distal radial artery Sedation: 100 mcg fentanyl; 2 mg midazolan Contrast: 55 mL Omnipaque 240 Findings: There is mild stenosis of the mid basilar artery with no flow limitation. Atherosclerotic changes of the bilateral intracranial ICAs with approximately 54% stenosis at the paraclinoid level on the left. No significant stenosis on the right. Blood loss: minimal. Complications: None. Patient tolerated the procedure well and is stable.        Discharge Exam: Vitals:   10/06/19 0359 10/06/19 0901  BP: (!) 152/57 (!) 142/128  Pulse: 66 81  Resp: 18  20  Temp: 98.3 F (36.8 C) 98.2 F (36.8 C)  SpO2: 100% 100%     General: Pt is alert, awake, not in acute distress Cardiovascular: RRR, S1/S2 +, no edema Respiratory: CTA bilaterally, no wheezing, no rhonchi, no respiratory distress, no conversational dyspnea  Abdominal: Soft, NT, ND, bowel sounds + Extremities: no edema, no cyanosis Psych: Normal mood and affect, stable judgement and insight     The results of significant diagnostics from this hospitalization (including imaging, microbiology, ancillary and laboratory) are listed below for reference.     Microbiology: Recent Results (from the past 240 hour(s))  SARS CORONAVIRUS 2 (TAT 6-24 HRS) Nasopharyngeal Nasopharyngeal Swab     Status: None   Collection Time: 10/03/19  9:45 AM   Specimen: Nasopharyngeal Swab  Result Value Ref Range Status   SARS Coronavirus 2 NEGATIVE NEGATIVE Final    Comment: (NOTE) SARS-CoV-2 target nucleic acids are NOT DETECTED. The SARS-CoV-2 RNA is generally detectable in upper and lower respiratory specimens during the acute phase of infection. Negative results do not preclude SARS-CoV-2 infection, do not rule out co-infections with other pathogens, and should not be used as the sole basis for treatment or other patient management decisions. Negative results must be combined with clinical observations, patient history, and epidemiological information. The expected result is Negative. Fact Sheet for Patients: HairSlick.no Fact Sheet for Healthcare Providers: quierodirigir.com This test is not yet approved or cleared by the [place] FDA and  has been authorized for detection and/or diagnosis of SARS-CoV-2 by FDA under an Emergency Use Authorization (EUA). This EUA will remain  in effect (meaning this test can be used) for the duration of the COVID-19 declaration under Section 56 4(b)(1) of the Act, 21 U.S.C. section 360bbb-3(b)(1),  unless the authorization is terminated or revoked sooner. Performed at [facility], [address] [place], [place] [address]      Labs: BNP (last 3 results) No results for input(s): BNP in the last 8760 hours. Basic Metabolic Panel: Recent Labs  Lab 10/03/19 0445 10/05/19 0827 10/06/19 0144  NA 140 140 142  K 3.3* 3.2* 3.7  CL 101 106 107  CO2 24 23 24  GLUCOSE 180* 155* 98  BUN 9 5* <5*  CREATININE 0.79 0.59 0.69  CALCIUM 9.3 8.9 8.9  MG  --  1.6* 2.0   Liver Function Tests: Recent Labs  Lab 10/03/19 0445  AST 23  ALT 21  ALKPHOS 137*  BILITOT 0.6  PROT 7.5  ALBUMIN 3.1*   No results for input(s): LIPASE, AMYLASE in the last 168 hours. No results for input(s): AMMONIA in the last 168 hours. CBC: Recent Labs  Lab 10/03/19 0445 10/04/19 0846  WBC 8.4  --   HGB 10.1*  --   HCT 32.2* 30.2*  MCV 76.7*  --   PLT 524*  --    Cardiac Enzymes: No results for input(s): CKTOTAL, CKMB, CKMBINDEX, TROPONINI in the last 168 hours. BNP: Invalid input(s): POCBNP CBG: Recent Labs  Lab 10/05/19 0824 10/05/19 1143 10/05/19 1628 10/05/19 2102 10/06/19 0626  GLUCAP 134* 80 106* 89 72   D-Dimer No results for input(s): DDIMER in the last 72 hours. Hgb A1c No results for input(s): HGBA1C in the last 72 hours. Lipid Profile No results for input(s): CHOL, HDL, LDLCALC, TRIG, CHOLHDL, LDLDIRECT in the last 72 hours. Thyroid function studies Recent Labs    10/04/19 0338  TSH 0.087*   Anemia work up Recent Labs    10/04/19 0846  VITAMINB12 468  TIBC 196*  IRON 32   Urinalysis    Component Value Date/Time   COLORURINE YELLOW 10/03/2019 0814   APPEARANCEUR CLEAR 10/03/2019 0814   LABSPEC <1.005 (L) 10/03/2019 0814   PHURINE 5.5 10/03/2019 0814   GLUCOSEU NEGATIVE 10/03/2019 0814   HGBUR NEGATIVE 10/03/2019 0814   BILIRUBINUR NEGATIVE 10/03/2019 0814   KETONESUR 15 (A) 10/03/2019 0814   PROTEINUR NEGATIVE 10/03/2019 0814   UROBILINOGEN 1.0  03/04/2007 1518   NITRITE NEGATIVE 10/03/2019 0814   LEUKOCYTESUR NEGATIVE 10/03/2019 0814   Sepsis Labs Invalid input(s): PROCALCITONIN,  WBC,  LACTICIDVEN Microbiology Recent Results (from the past 240 hour(s))  SARS CORONAVIRUS 2 (TAT 6-24 HRS) Nasopharyngeal Nasopharyngeal Swab     Status: None   Collection Time: 10/03/19  9:45 AM   Specimen: Nasopharyngeal Swab  Result Value Ref Range Status   SARS Coronavirus 2 NEGATIVE NEGATIVE Final    Comment: (NOTE) SARS-CoV-2 target nucleic acids are NOT DETECTED. The SARS-CoV-2 RNA is generally detectable in upper and lower respiratory specimens during the acute phase of infection. Negative results do not preclude SARS-CoV-2 infection, do not rule out co-infections with other pathogens, and should not be used as the sole basis for treatment or other patient management decisions. Negative results must be combined with clinical observations, patient history, and epidemiological information. The expected result is Negative. Fact Sheet for Patients: HairSlick.no Fact Sheet for Healthcare Providers: quierodirigir.com This test is not yet approved or cleared by the [place] FDA and  has been authorized for detection and/or diagnosis of SARS-CoV-2 by FDA under an Emergency Use Authorization (EUA). This EUA will remain  in effect (meaning this test can be used) for the duration of the COVID-19 declaration under Section 56 4(b)(1) of the Act, 21 U.S.C. section 360bbb-3(b)(1), unless the authorization is terminated or revoked sooner. Performed at [facility], [address], [place], [place] [address]      Patient was seen and examined on the day of discharge and was found to be in stable condition. Time coordinating discharge: 25 minutes including assessment and coordination of care, as well as examination of the patient.   SIGNED:  Victorino Dike  Alvino Chapel, DO [facility] 10/06/2019, 9:41 AM

## 2019-10-06 NOTE — Progress Notes (Signed)
Patient being discharged home, transportation provided by sister. Patient educated on discharge instructions, questions answered. Belongings returned to patient.

## 2019-10-06 NOTE — TOC Transition Note (Signed)
Transition of Care [facility]) - CM/SW Discharge Note   Patient Details  Name: RASHANA ANDREW MRN: [id number] Date of Birth: Mar 31, 1966  [facility]) CM/SW Contact:  Kermit Balo, RN Phone Number: 10/06/2019, 11:01 AM   Clinical Narrative:    Pt discharging to mothers home today. Pt to follow up with outpatient therapy and mother can provide transportation. Sister providing transport home.   Final next level of care: OP Rehab Barriers to Discharge: No Barriers Identified   Patient Goals and CMS Choice   CMS Medicare.gov Compare Post Acute Care list provided to:: Patient Choice offered to / list presented to : Patient  Discharge Placement                       Discharge Plan and Services   Discharge Planning Services: CM Consult                                 Social Determinants of Health (SDOH) Interventions     Readmission Risk Interventions No flowsheet data found.

## 2019-10-09 ENCOUNTER — Emergency Department (HOSPITAL_COMMUNITY): Payer: BLUE CROSS/BLUE SHIELD

## 2019-10-09 ENCOUNTER — Inpatient Hospital Stay (HOSPITAL_COMMUNITY)
Admission: EM | Admit: 2019-10-09 | Discharge: 2019-10-17 | DRG: 065 | Disposition: A | Discharge status: Rehabilitation Facility | Payer: BLUE CROSS/BLUE SHIELD | Attending: Student | Admitting: Student

## 2019-10-09 ENCOUNTER — Other Ambulatory Visit: Payer: Self-pay

## 2019-10-09 ENCOUNTER — Encounter (HOSPITAL_COMMUNITY): Payer: Self-pay | Admitting: Emergency Medicine

## 2019-10-09 DIAGNOSIS — R29706 NIHSS score 6: Secondary | ICD-10-CM | POA: Diagnosis present

## 2019-10-09 DIAGNOSIS — Z7982 Long term (current) use of aspirin: Secondary | ICD-10-CM

## 2019-10-09 DIAGNOSIS — I951 Orthostatic hypotension: Secondary | ICD-10-CM | POA: Diagnosis present

## 2019-10-09 DIAGNOSIS — R4701 Aphasia: Secondary | ICD-10-CM | POA: Diagnosis present

## 2019-10-09 DIAGNOSIS — Z794 Long term (current) use of insulin: Secondary | ICD-10-CM | POA: Diagnosis not present

## 2019-10-09 DIAGNOSIS — I69351 Hemiplegia and hemiparesis following cerebral infarction affecting right dominant side: Secondary | ICD-10-CM

## 2019-10-09 DIAGNOSIS — R4781 Slurred speech: Secondary | ICD-10-CM | POA: Diagnosis present

## 2019-10-09 DIAGNOSIS — E1165 Type 2 diabetes mellitus with hyperglycemia: Secondary | ICD-10-CM | POA: Diagnosis present

## 2019-10-09 DIAGNOSIS — I10 Essential (primary) hypertension: Secondary | ICD-10-CM | POA: Diagnosis present

## 2019-10-09 DIAGNOSIS — I651 Occlusion and stenosis of basilar artery: Secondary | ICD-10-CM | POA: Diagnosis present

## 2019-10-09 DIAGNOSIS — E1151 Type 2 diabetes mellitus with diabetic peripheral angiopathy without gangrene: Secondary | ICD-10-CM | POA: Diagnosis present

## 2019-10-09 DIAGNOSIS — Z20822 Contact with and (suspected) exposure to covid-19: Secondary | ICD-10-CM | POA: Diagnosis present

## 2019-10-09 DIAGNOSIS — I6523 Occlusion and stenosis of bilateral carotid arteries: Secondary | ICD-10-CM | POA: Diagnosis present

## 2019-10-09 DIAGNOSIS — Z79890 Hormone replacement therapy (postmenopausal): Secondary | ICD-10-CM

## 2019-10-09 DIAGNOSIS — Z7984 Long term (current) use of oral hypoglycemic drugs: Secondary | ICD-10-CM

## 2019-10-09 DIAGNOSIS — Z79899 Other long term (current) drug therapy: Secondary | ICD-10-CM | POA: Diagnosis not present

## 2019-10-09 DIAGNOSIS — E876 Hypokalemia: Secondary | ICD-10-CM | POA: Diagnosis present

## 2019-10-09 DIAGNOSIS — E039 Hypothyroidism, unspecified: Secondary | ICD-10-CM | POA: Diagnosis present

## 2019-10-09 DIAGNOSIS — R29810 Facial weakness: Secondary | ICD-10-CM | POA: Diagnosis present

## 2019-10-09 DIAGNOSIS — D509 Iron deficiency anemia, unspecified: Secondary | ICD-10-CM | POA: Diagnosis present

## 2019-10-09 DIAGNOSIS — I639 Cerebral infarction, unspecified: Secondary | ICD-10-CM | POA: Diagnosis not present

## 2019-10-09 DIAGNOSIS — Z7902 Long term (current) use of antithrombotics/antiplatelets: Secondary | ICD-10-CM

## 2019-10-09 DIAGNOSIS — R130 Aphagia: Secondary | ICD-10-CM | POA: Diagnosis present

## 2019-10-09 DIAGNOSIS — I6381 Other cerebral infarction due to occlusion or stenosis of small artery: Principal | ICD-10-CM | POA: Diagnosis present

## 2019-10-09 DIAGNOSIS — R296 Repeated falls: Secondary | ICD-10-CM | POA: Diagnosis present

## 2019-10-09 DIAGNOSIS — E119 Type 2 diabetes mellitus without complications: Secondary | ICD-10-CM

## 2019-10-09 DIAGNOSIS — I6319 Cerebral infarction due to embolism of other precerebral artery: Secondary | ICD-10-CM | POA: Diagnosis not present

## 2019-10-09 DIAGNOSIS — E785 Hyperlipidemia, unspecified: Secondary | ICD-10-CM | POA: Diagnosis present

## 2019-10-09 DIAGNOSIS — R531 Weakness: Secondary | ICD-10-CM | POA: Diagnosis present

## 2019-10-09 DIAGNOSIS — I6522 Occlusion and stenosis of left carotid artery: Secondary | ICD-10-CM | POA: Diagnosis not present

## 2019-10-09 LAB — CBC WITH DIFFERENTIAL/PLATELET
Abs Immature Granulocytes: 0.05 10*3/uL (ref 0.00–0.07)
Basophils Absolute: 0 10*3/uL (ref 0.0–0.1)
Basophils Relative: 0 %
Eosinophils Absolute: 0.1 10*3/uL (ref 0.0–0.5)
Eosinophils Relative: 1 %
HCT: 32.9 % — ABNORMAL LOW (ref 36.0–46.0)
Hemoglobin: 10.2 g/dL — ABNORMAL LOW (ref 12.0–15.0)
Immature Granulocytes: 1 %
Lymphocytes Relative: 20 %
Lymphs Abs: 2.2 10*3/uL (ref 0.7–4.0)
MCH: 23.7 pg — ABNORMAL LOW (ref 26.0–34.0)
MCHC: 31 g/dL (ref 30.0–36.0)
MCV: 76.3 fL — ABNORMAL LOW (ref 80.0–100.0)
Monocytes Absolute: 0.9 10*3/uL (ref 0.1–1.0)
Monocytes Relative: 8 %
Neutro Abs: 7.7 10*3/uL (ref 1.7–7.7)
Neutrophils Relative %: 70 %
Platelets: 582 10*3/uL — ABNORMAL HIGH (ref 150–400)
RBC: 4.31 MIL/uL (ref 3.87–5.11)
RDW: 13.9 % (ref 11.5–15.5)
WBC: 11 10*3/uL — ABNORMAL HIGH (ref 4.0–10.5)
nRBC: 0 % (ref 0.0–0.2)

## 2019-10-09 LAB — COMPREHENSIVE METABOLIC PANEL
ALT: 29 U/L (ref 0–44)
AST: 30 U/L (ref 15–41)
Albumin: 3.3 g/dL — ABNORMAL LOW (ref 3.5–5.0)
Alkaline Phosphatase: 131 U/L — ABNORMAL HIGH (ref 38–126)
Anion gap: 15 (ref 5–15)
BUN: 11 mg/dL (ref 6–20)
CO2: 21 mmol/L — ABNORMAL LOW (ref 22–32)
Calcium: 9.5 mg/dL (ref 8.9–10.3)
Chloride: 108 mmol/L (ref 98–111)
Creatinine, Ser: 0.75 mg/dL (ref 0.44–1.00)
GFR calc Af Amer: >60 mL/min (ref >60)
GFR calc non Af Amer: >60 mL/min (ref >60)
Glucose, Bld: 99 mg/dL (ref 70–99)
Potassium: 3.5 mmol/L (ref 3.5–5.1)
Sodium: 144 mmol/L (ref 135–145)
Total Bilirubin: 1 mg/dL (ref 0.3–1.2)
Total Protein: 7.4 g/dL (ref 6.5–8.1)

## 2019-10-09 LAB — GLUCOSE, CAPILLARY
Glucose-Capillary: 66 mg/dL — ABNORMAL LOW (ref 70–99)
Glucose-Capillary: 145 mg/dL — ABNORMAL HIGH (ref 70–99)

## 2019-10-09 LAB — PROTIME-INR
INR: 1.1 (ref 0.8–1.2)
Prothrombin Time: 14.3 seconds (ref 11.4–15.2)

## 2019-10-09 LAB — I-STAT CHEM 8, ED
BUN: 10 mg/dL (ref 6–20)
Calcium, Ion: 1.18 mmol/L (ref 1.15–1.40)
Chloride: 108 mmol/L (ref 98–111)
Creatinine, Ser: 0.5 mg/dL (ref 0.44–1.00)
Glucose, Bld: 101 mg/dL — ABNORMAL HIGH (ref 70–99)
HCT: 32 % — ABNORMAL LOW (ref 36.0–46.0)
Hemoglobin: 10.9 g/dL — ABNORMAL LOW (ref 12.0–15.0)
Potassium: 3.4 mmol/L — ABNORMAL LOW (ref 3.5–5.1)
Sodium: 144 mmol/L (ref 135–145)
TCO2: 24 mmol/L (ref 22–32)

## 2019-10-09 LAB — I-STAT BETA HCG BLOOD, ED (MC, WL, AP ONLY): I-stat hCG, quantitative: <5 m[IU]/mL (ref <5)

## 2019-10-09 LAB — APTT: aPTT: 32 seconds (ref 24–36)

## 2019-10-09 LAB — SARS CORONAVIRUS 2 (TAT 6-24 HRS): SARS Coronavirus 2: NEGATIVE

## 2019-10-09 MED ORDER — STROKE: EARLY STAGES OF RECOVERY BOOK
Freq: Once | Status: AC
  Filled 2019-10-09: qty 1

## 2019-10-09 MED ORDER — ACETAMINOPHEN 325 MG PO TABS
650.0000 mg | ORAL_TABLET | ORAL | Status: DC | PRN
  Administered 2019-10-15: 650 mg via ORAL
  Filled 2019-10-09: qty 2

## 2019-10-09 MED ORDER — ASPIRIN EC 325 MG PO TBEC
325.0000 mg | DELAYED_RELEASE_TABLET | Freq: Every day | ORAL | Status: DC
  Administered 2019-10-10 – 2019-10-17 (×8): 325 mg via ORAL
  Filled 2019-10-09 (×8): qty 1

## 2019-10-09 MED ORDER — POTASSIUM CHLORIDE 10 MEQ/100ML IV SOLN
10.0000 meq | INTRAVENOUS | Status: AC
  Administered 2019-10-09 (×2): 10 meq via INTRAVENOUS
  Filled 2019-10-09 (×2): qty 100

## 2019-10-09 MED ORDER — SENNOSIDES-DOCUSATE SODIUM 8.6-50 MG PO TABS: 1.0000 | ORAL_TABLET | Freq: Every evening | ORAL | Status: DC | PRN

## 2019-10-09 MED ORDER — WHITE PETROLATUM EX OINT: 1.0000 "application " | TOPICAL_OINTMENT | CUTANEOUS | Status: DC | PRN

## 2019-10-09 MED ORDER — IOHEXOL 350 MG/ML SOLN
75.0000 mL | Freq: Once | INTRAVENOUS | Status: AC | PRN
  Administered 2019-10-09: 75 mL via INTRAVENOUS

## 2019-10-09 MED ORDER — ACETAMINOPHEN 160 MG/5ML PO SOLN: 650.0000 mg | ORAL | Status: DC | PRN

## 2019-10-09 MED ORDER — WHITE PETROLATUM EX OINT
TOPICAL_OINTMENT | CUTANEOUS | Status: AC
  Administered 2019-10-09: 1
  Filled 2019-10-09: qty 28.35

## 2019-10-09 MED ORDER — VITAMIN D 25 MCG (1000 UNIT) PO TABS
1000.0000 [IU] | ORAL_TABLET | Freq: Every day | ORAL | Status: DC
  Administered 2019-10-10 – 2019-10-17 (×8): 1000 [IU] via ORAL
  Filled 2019-10-09 (×10): qty 1

## 2019-10-09 MED ORDER — INSULIN ASPART 100 UNIT/ML ~~LOC~~ SOLN
0.0000 [IU] | Freq: Three times a day (TID) | SUBCUTANEOUS | Status: DC
  Administered 2019-10-10 – 2019-10-11 (×2): 1 [IU] via SUBCUTANEOUS
  Administered 2019-10-11: 2 [IU] via SUBCUTANEOUS
  Administered 2019-10-12 – 2019-10-13 (×4): 1 [IU] via SUBCUTANEOUS
  Administered 2019-10-13: 3 [IU] via SUBCUTANEOUS
  Administered 2019-10-13 – 2019-10-14 (×4): 2 [IU] via SUBCUTANEOUS
  Administered 2019-10-15: 1 [IU] via SUBCUTANEOUS
  Administered 2019-10-15 – 2019-10-16 (×3): 2 [IU] via SUBCUTANEOUS

## 2019-10-09 MED ORDER — LABETALOL HCL 5 MG/ML IV SOLN: 5.0000 mg | INTRAVENOUS | Status: DC | PRN

## 2019-10-09 MED ORDER — SODIUM CHLORIDE 0.9% FLUSH: 3.0000 mL | Freq: Once | INTRAVENOUS | Status: DC

## 2019-10-09 MED ORDER — SODIUM CHLORIDE 0.9 % IV SOLN
INTRAVENOUS | Status: AC
  Administered 2019-10-09: 17:00:00 75 mL/h via INTRAVENOUS

## 2019-10-09 MED ORDER — FERROUS SULFATE 325 (65 FE) MG PO TABS
325.0000 mg | ORAL_TABLET | Freq: Every day | ORAL | Status: DC
  Administered 2019-10-10 – 2019-10-17 (×8): 325 mg via ORAL
  Filled 2019-10-09 (×8): qty 1

## 2019-10-09 MED ORDER — CLOPIDOGREL BISULFATE 75 MG PO TABS
75.0000 mg | ORAL_TABLET | Freq: Every day | ORAL | Status: DC
  Administered 2019-10-10 – 2019-10-17 (×8): 75 mg via ORAL
  Filled 2019-10-09 (×8): qty 1

## 2019-10-09 MED ORDER — ATORVASTATIN CALCIUM 80 MG PO TABS
80.0000 mg | ORAL_TABLET | Freq: Every day | ORAL | Status: DC
  Administered 2019-10-10 – 2019-10-16 (×7): 80 mg via ORAL
  Filled 2019-10-09 (×7): qty 1

## 2019-10-09 MED ORDER — LEVOTHYROXINE SODIUM 100 MCG PO TABS
100.0000 ug | ORAL_TABLET | Freq: Every day | ORAL | Status: DC
  Administered 2019-10-10 – 2019-10-17 (×8): 100 ug via ORAL
  Filled 2019-10-09 (×8): qty 1

## 2019-10-09 MED ORDER — HEPARIN SODIUM (PORCINE) 5000 UNIT/ML IJ SOLN
5000.0000 [IU] | Freq: Three times a day (TID) | INTRAMUSCULAR | Status: DC
  Administered 2019-10-09 – 2019-10-17 (×24): 5000 [IU] via SUBCUTANEOUS
  Filled 2019-10-09 (×23): qty 1

## 2019-10-09 MED ORDER — DEXTROSE 50 % IV SOLN
INTRAVENOUS | Status: AC
  Administered 2019-10-09: 18:00:00 50 mL
  Filled 2019-10-09: qty 50

## 2019-10-09 MED ORDER — ACETAMINOPHEN 650 MG RE SUPP: 650.0000 mg | RECTAL | Status: DC | PRN

## 2019-10-09 NOTE — ED Triage Notes (Signed)
Pt arrives to ED with [facility] as a Code Stroke with complaints of worsened slurred speech starting acutely at 1250 per patients son. Patient recently with acute pontine infarct. Per family patients speech has been slurred this week but this afternoon it has worsened to the point she cannot make words. Patient has no weakness, sensory or vision issues at this time.

## 2019-10-09 NOTE — ED Provider Notes (Signed)
[facility] EMERGENCY DEPARTMENT Provider Note   CSN: [id number] Arrival date & time: 10/09/19  1326     History Chief Complaint  Patient presents with  . Code Stroke    Amy Raymond is a 54 y.o. female with history of 2 recent CVAs, DM, HTN who presents as a code stroke. She was admitted for the first stroke on 1/30 and had complaints of headaches, aphasia, vision loss, dizziness, and a syncopal episode. MRI confirmed small vessel infarcts and narrowing of the basilar arteries. She was discharged on ASA and Plavix. She came back to the ED on 2/22 after she was reporting generalized weakness and falls. MRI again showed an acute pontine CVA. 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 She was discharged on 2/25. She returns today due to increased dysphagia and worsening facial droop. It started around 1250. Pt denies any other symptoms. No headache, vision changes, paresthesias, dizziness, worsening weakness.    HPI     Past Medical History:  Diagnosis Date  . CVA (cerebral vascular accident) (HCC)    09/11/19; 10/03/19  . Diabetes mellitus without complication (HCC)   . Dyslipidemia   . Hypertension   . Hypothyroidism (acquired)     Patient Active Problem List   Diagnosis Date Noted  . 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 Hypothyroidism (acquired)   . Hypertension   . Dyslipidemia   . Diabetes mellitus without complication (HCC)   . Intractable headache 09/11/2019  . Hypothyroidism 09/11/2019  . Type 2 diabetes mellitus without complication (HCC) 09/11/2019  . CVA (cerebral vascular accident) (HCC) 09/11/2019  . Elevated liver enzymes 09/11/2019    Past Surgical History:  Procedure Laterality Date  . BUBBLE STUDY  10/04/2019   Procedure: BUBBLE STUDY;  Surgeon: Chilton Si,  MD;  Location: [facility] ENDOSCOPY;  Service: Cardiovascular;;  . IR ANGIO INTRA EXTRACRAN SEL INTERNAL CAROTID BILAT MOD SED  10/05/2019  . IR ANGIO VERTEBRAL SEL VERTEBRAL UNI R MOD SED  10/05/2019  . IR US GUIDE VASC ACCESS RIGHT  10/05/2019  . TEE WITHOUT CARDIOVERSION N/A 10/04/2019   Procedure: TRANSESOPHAGEAL ECHOCARDIOGRAM (TEE);  Surgeon: Chilton Si, MD;  Location: [facility] ENDOSCOPY;  Service: Cardiovascular;  Laterality: N/A;  . TUBAL LIGATION       OB History   No obstetric history on file.     Family History  Problem Relation Age of Onset  . Asthma Mother   . Thrombocytopenia Mother   . Dementia Father   . Stroke Neg Hx     Social History   Tobacco Use  . Smoking status: Never Smoker  . Smokeless tobacco: Never Used  Substance Use Topics  . Alcohol use: No  . Drug use: No    Home Medications Prior to Admission medications   Medication Sig Start Date End Date Taking? Authorizing Provider  aspirin EC 325 MG EC tablet Take 1 tablet (325 mg total) by mouth daily. 10/07/19 01/05/20  Noralee Stain, DO  atorvastatin (LIPITOR) 80 MG tablet Take 1 tablet (80 mg total) by mouth daily at 6 PM. 10/06/19   Noralee Stain, DO  cholecalciferol (VITAMIN D3) 25 MCG (1000 UNIT) tablet Take 1,000 Units by mouth daily.    [provider]  clopidogrel (PLAVIX) 75 MG tablet Take 1 tablet (75 mg total) by mouth daily. 09/13/19   Alwyn Ren,  MD  ferrous sulfate 325 (65 FE) MG tablet Take 325 mg by mouth daily with breakfast.    [provider]  glipiZIDE (GLUCOTROL) 10 MG tablet Take 10 mg by mouth 2 (two) times daily. 08/22/19   [provider]  levothyroxine (SYNTHROID) 100 MCG tablet Take 1 tablet (100 mcg total) by mouth daily before breakfast. 10/06/19   Noralee Stain, DO  Linagliptin-Metformin HCl (JENTADUETO) 2.12-998 MG TABS Take 1 tablet by mouth 2 (two) times daily.      [provider]  lisinopril (ZESTRIL) 10 MG tablet Take 10 mg by mouth  daily. 09/28/19   [provider]  ondansetron (ZOFRAN) 4 MG tablet Take 1 tablet (4 mg total) by mouth every 6 (six) hours as needed for nausea. 09/12/19   Alwyn Ren, MD    Allergies    Patient has no known allergies.  Review of Systems   Review of Systems  Constitutional: Negative for fever.  Respiratory: Negative for shortness of breath.   Cardiovascular: Negative for chest pain.  Gastrointestinal: Negative for abdominal pain.  Neurological: Positive for facial asymmetry, speech difficulty and weakness (chronic, right sided). Negative for dizziness, tremors, syncope, numbness and headaches.  All other systems reviewed and are negative.   Physical Exam Updated Vital Signs BP 140/90 (BP Location: Left Arm)   Pulse 90   Temp 98.1 F (36.7 C) (Oral)   Resp 16   SpO2 99%   Physical Exam Vitals and nursing note reviewed.  Constitutional:      General: She is not in acute distress.    Appearance: Normal appearance. She is well-developed. She is not ill-appearing.  HENT:     Head: Normocephalic and atraumatic.  Eyes:     General: No scleral icterus.       Right eye: No discharge.        Left eye: No discharge.     Conjunctiva/sclera: Conjunctivae normal.     Pupils: Pupils are equal, round, and reactive to light.  Cardiovascular:     Rate and Rhythm: Normal rate and regular rhythm.  Pulmonary:     Effort: Pulmonary effort is normal. No respiratory distress.     Breath sounds: Normal breath sounds.  Abdominal:     General: There is no distension.     Palpations: Abdomen is soft.     Tenderness: There is no abdominal tenderness.  Musculoskeletal:     Cervical back: Normal range of motion.  Skin:    General: Skin is warm and dry.  Neurological:     Mental Status: She is alert and oriented to person, place, and time.     Comments: Mental Status:  Alert, oriented, thought content appropriate, able to give a coherent history. Dysphasia noted. Able to  follow 2 step commands without difficulty.  Cranial Nerves:  II:  Peripheral visual fields grossly normal, pupils equal, round, reactive to light III,IV, VI: ptosis not present, Horizontal nystagmus bilaterally V,VII: smile is asymmetric, facial light touch sensation equal VIII: hearing grossly normal to voice  X: uvula elevates symmetrically  XI: bilateral shoulder shrug symmetric and strong XII: midline tongue extension without fassiculations Motor:  Normal tone. 5/5 in LUE and bilateral lower extremities. 4/5 in RUE  Sensory: Pinprick and light touch normal in all extremities.  Deep Tendon Reflexes: 2+ and symmetric in the biceps and patella Cerebellar: Dysmetria on the right side Gait: not tested CV: distal pulses palpable throughout    Psychiatric:  Behavior: Behavior normal.     ED Results / Procedures / Treatments   Labs (all labs ordered are listed, but only abnormal results are displayed) Labs Reviewed  COMPREHENSIVE METABOLIC PANEL - Abnormal; Notable for the following components:      Result Value   CO2 21 (*)    Albumin 3.3 (*)    Alkaline Phosphatase 131 (*)    All other components within normal limits  I-STAT CHEM 8, ED - Abnormal; Notable for the following components:   Potassium 3.4 (*)    Glucose, Bld 101 (*)    Hemoglobin 10.9 (*)    HCT 32.0 (*)    All other components within normal limits  SARS CORONAVIRUS 2 (TAT 6-24 HRS)  PROTIME-INR  APTT  CBC WITH DIFFERENTIAL/PLATELET  CBG MONITORING, ED  I-STAT BETA HCG BLOOD, ED ([facility], [facility], [facility] ONLY)    EKG None  Radiology MR ANGIO HEAD WO CONTRAST  Result Date: 10/09/2019 CLINICAL DATA:  Worsening slurred speech. Recent pontine infarct. EXAM: MRI HEAD WITHOUT CONTRAST MRA HEAD WITHOUT CONTRAST TECHNIQUE: Multiplanar, multiecho pulse sequences of the brain and surrounding structures were obtained without intravenous contrast. Angiographic images of the head were obtained using MRA technique without  contrast. COMPARISON:  Head CT 10/09/2019, MRI 10/03/2019, and CTA 10/03/2019. Cerebral angiogram 10/05/2019. FINDINGS: MRI HEAD FINDINGS An axial T1 sequence was not performed. Brain: An acute infarct involving the central and left paracentral pons has mildly enlarged from the prior MRI. There are new single punctate acute infarcts in each cerebellar hemisphere, and there are numerous new punctate acute infarcts scattered throughout both cerebral hemispheres involving cortex and white matter of multiple different vascular distributions as well as involving the left thalamus. There is an unchanged single chronic microhemorrhage in the right temporal lobe. No mass, midline shift, or extra-axial fluid collection is identified. A background of mild chronic small vessel ischemia is again noted in the cerebral white matter. The ventricles and sulci are within normal limits for age. Vascular: Unchanged abnormal appearance of the distal left vertebral artery. Skull and upper cervical spine: Unremarkable bone marrow signal. Sinuses/Orbits: Unremarkable orbits. Paranasal sinuses and mastoid air cells are clear. Other: None. MRA HEAD FINDINGS The visualized distal right vertebral artery is widely patent and supplies the basilar. The proximal left V4 segment remains occluded. There is retrograde opacification of the distal left V4 segment supplying the left PICA. Patent AICAs and SCAs are seen bilaterally. The basilar artery is patent with a mild stenosis of its midportion as previously seen. There is a moderate-sized right posterior communicating artery. Both PCAs are patent without evidence of significant proximal stenosis. The internal carotid arteries are patent from skull base to carotid termini with similar appearance of bilateral ICA stenoses near the posterior genu, moderate to severe on the left and mild-to-moderate on the right. An infundibulum is noted at the left posterior communicating origin. ACAs and MCAs are  patent without evidence of proximal branch occlusion or significant proximal stenosis. No aneurysm is identified. IMPRESSION: 1. Mild enlargement of a pontine infarct since the 10/03/2019 MRI. 2. Numerous new punctate acute infarcts throughout both cerebral and cerebellar hemispheres. 3. Unchanged intracranial atherosclerosis including mild to moderate right and moderate to severe left ICA stenoses and a mild mid basilar artery stenosis. 4. 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 Electronically Signed   By: Logan Bores M.D.   On: 10/09/2019 15:21   MR BRAIN WO CONTRAST  Result Date: 10/09/2019 CLINICAL DATA:  Worsening slurred  speech. Recent pontine infarct. EXAM: MRI HEAD WITHOUT CONTRAST MRA HEAD WITHOUT CONTRAST TECHNIQUE: Multiplanar, multiecho pulse sequences of the brain and surrounding structures were obtained without intravenous contrast. Angiographic images of the head were obtained using MRA technique without contrast. COMPARISON:  Head CT 10/09/2019, MRI 10/03/2019, and CTA 10/03/2019. Cerebral angiogram 10/05/2019. FINDINGS: MRI HEAD FINDINGS An axial T1 sequence was not performed. Brain: An acute infarct involving the central and left paracentral pons has mildly enlarged from the prior MRI. There are new single punctate acute infarcts in each cerebellar hemisphere, and there are numerous new punctate acute infarcts scattered throughout both cerebral hemispheres involving cortex and white matter of multiple different vascular distributions as well as involving the left thalamus. There is an unchanged single chronic microhemorrhage in the right temporal lobe. No mass, midline shift, or extra-axial fluid collection is identified. A background of mild chronic small vessel ischemia is again noted in the cerebral white matter. The ventricles and sulci are within normal limits for age. Vascular: Unchanged abnormal appearance of the distal left vertebral artery. Skull and upper cervical  spine: Unremarkable bone marrow signal. Sinuses/Orbits: Unremarkable orbits. Paranasal sinuses and mastoid air cells are clear. Other: None. MRA HEAD FINDINGS The visualized distal right vertebral artery is widely patent and supplies the basilar. The proximal left V4 segment remains occluded. There is retrograde opacification of the distal left V4 segment supplying the left PICA. Patent AICAs and SCAs are seen bilaterally. The basilar artery is patent with a mild stenosis of its midportion as previously seen. There is a moderate-sized right posterior communicating artery. Both PCAs are patent without evidence of significant proximal stenosis. The internal carotid arteries are patent from skull base to carotid termini with similar appearance of bilateral ICA stenoses near the posterior genu, moderate to severe on the left and mild-to-moderate on the right. An infundibulum is noted at the left posterior communicating origin. ACAs and MCAs are patent without evidence of proximal branch occlusion or significant proximal stenosis. No aneurysm is identified. IMPRESSION: 1. Mild enlargement of a pontine infarct since the 10/03/2019 MRI. 2. Numerous new punctate acute infarcts throughout both cerebral and cerebellar hemispheres. 3. Unchanged intracranial atherosclerosis including mild to moderate right and moderate to severe left ICA stenoses and a mild mid basilar artery stenosis. 4. 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 Electronically Signed   By: Sebastian Ache M.D.   On: 10/09/2019 15:21   CT HEAD CODE STROKE WO CONTRAST  Result Date: 10/09/2019 CLINICAL DATA:  Code stroke.  Slurred speech.  Recent stroke. EXAM: CT HEAD WITHOUT CONTRAST TECHNIQUE: Contiguous axial images were obtained from the base of the skull through the vertex without intravenous contrast. COMPARISON:  MRI head 10/03/2019 FINDINGS: Brain: Hypodensity in the central and ventral pons similar to the prior MRI compatible with  subacute infarct. 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 Negative for new  infarct.  Negative for acute hemorrhage or mass. Vascular: Negative for hyperdense vessel Skull: Negative Sinuses/Orbits: Negative Other: None ASPECTS (Alberta Stroke Program Early CT Score) - Ganglionic level infarction (caudate, lentiform nuclei, internal capsule, insula, M1-M3 cortex): 7 - Supraganglionic infarction (M4-M6 cortex): 3 Total score (0-10 with 10 being normal): 10 IMPRESSION: 1. Recent infarct in the central pons unchanged. No new area of infarct or hemorrhage. 2. ASPECTS is 10 3. These results were called by telephone at the time of interpretation on 10/09/2019 at 1:47 pm to provider Wilford Corner , who verbally acknowledged these results. Electronically Signed  By: Marlan Palau M.D.   On: 10/09/2019 13:48    Procedures Procedures (including critical care time)  Medications Ordered in ED Medications  sodium chloride flush (NS) 0.9 % injection 3 mL (has no administration in time range)    ED Course  I have reviewed the triage vital signs and the nursing notes.  Pertinent labs & imaging results that were available during my care of the patient were reviewed by me and considered in my medical decision making (see chart for details).  Clinical Course as of Oct 08 1457  Sun Oct 09, 2019  3032 54 year old female with known vascular disease prior strokes including recently this last week here with new worsening speech.  Patient is a stroke activation on arrival.  Getting CT neurology consult lab work and anticipate admission for further work-up.   [MB]  1456 Discussed with Dr. Jerrell Belfast from neurology who said she has multiple strokes on her MRI and will need to come in for further work-up.   [MB]    Clinical Course User Index [MB] Terrilee Files, MD   54 year old female presents with worsening dysphagia and facial droop.  Patient has baseline right-sided deficits from prior strokes.   Patient came in as a code stroke.  Per neurology she is not a TPA candidate.  CT does not show anything new however stat MRI shows multiple acute punctate infarcts. Labs appear similar to baseline. Will admit for further management  Discussed with Dr. Chipper Herb who will admit for management of recurrent CVA  MDM Rules/Calculators/A&P                       Final Clinical Impression(s) / ED Diagnoses Final diagnoses:  Acute CVA (cerebrovascular accident) [facility])    Rx / DC Orders ED Discharge Orders    None       Bethel Born, PA-C 10/12/19 0708    Terrilee Files, MD 10/12/19 1444

## 2019-10-09 NOTE — H&P (Signed)
History and Physical    Amy Raymond:[id number] DOB: 1965/12/05 DOA: 10/09/2019  PCP: [place], Julie M, NP  Patient coming from: Home  I have personally briefly reviewed patient's old medical records in [facility]  Chief Complaint: Slurred speech  HPI: Amy Raymond is a 54 y.o. 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 Patient was discharged on 10/03/19 after second CVA in one month. She does have a residue right side weakness. Daughter noticed that on Friday, pt has had episodes of slurred speech and  today she was home and about 1250 her speech became slurred speech, confusion and dizzyness, also seemed have some trouble swallowing. Yesterday, symptoms worsened, for daughter noticed her mouth was foamy all day and continue to complains about blurry vision and trouble to speak.  ED Course: MRI showed multiple new strokes  Review of Systems: As per HPI otherwise 10 point review of systems negative.    Past Medical History:  Diagnosis Date  . CVA (cerebral vascular accident) (HCC)    09/11/19; 10/03/19  . Diabetes mellitus without complication (HCC)   . Dyslipidemia   . Hypertension   . Hypothyroidism (acquired)     Past Surgical History:  Procedure Laterality Date  . BUBBLE STUDY  10/04/2019   Procedure: BUBBLE STUDY;  Surgeon: Chilton Si, MD;  Location: [facility] ENDOSCOPY;  Service: Cardiovascular;;  . IR ANGIO INTRA EXTRACRAN SEL INTERNAL CAROTID BILAT MOD SED  10/05/2019  . IR ANGIO VERTEBRAL SEL VERTEBRAL UNI R MOD SED  10/05/2019  . IR US GUIDE VASC ACCESS RIGHT  10/05/2019  . TEE WITHOUT CARDIOVERSION N/A 10/04/2019   Procedure: TRANSESOPHAGEAL ECHOCARDIOGRAM (TEE);  Surgeon: Chilton Si, MD;  Location: [facility] ENDOSCOPY;  Service: Cardiovascular;  Laterality: N/A;  . TUBAL LIGATION       reports that she has never smoked. She has never used smokeless tobacco. She reports  that she does not drink alcohol or use drugs.  No Known Allergies  Family History  Problem Relation Age of Onset  . Asthma Mother   . Thrombocytopenia Mother   . Dementia Father   . Stroke Neg Hx     Prior to Admission medications   Medication Sig Start Date End Date Taking? Authorizing Provider  acetaminophen (TYLENOL) 500 MG tablet Take 500-1,000 mg by mouth every 6 (six) hours as needed for mild pain or headache.   Yes [provider]  aspirin EC 325 MG EC tablet Take 1 tablet (325 mg total) by mouth daily. 10/07/19 01/05/20 Yes Noralee Stain, DO  atorvastatin (LIPITOR) 40 MG tablet Take 40 mg by mouth daily.   Yes [provider]  cholecalciferol (VITAMIN D3) 25 MCG (1000 UNIT) tablet Take 1,000 Units by mouth daily.   Yes [provider]  clopidogrel (PLAVIX) 75 MG tablet Take 1 tablet (75 mg total) by mouth daily. 09/13/19  Yes Alwyn Ren, MD  glipiZIDE (GLUCOTROL) 10 MG tablet Take 10 mg by mouth 2 (two) times daily. 08/22/19  Yes [provider]  levothyroxine (SYNTHROID) 175 MCG tablet Take 175 mcg by mouth daily before breakfast.   Yes [provider]  Linagliptin-Metformin HCl (JENTADUETO) 2.12-998 MG TABS Take 1 tablet by mouth 2 (two) times daily.     Yes [provider]  ondansetron (ZOFRAN) 4 MG tablet Take 1 tablet (4 mg total) by mouth every 6 (six) hours as needed for nausea. 09/12/19  Yes Jerolyn Center,  Gardiner Ramus, MD  atorvastatin (LIPITOR) 80 MG tablet Take 1 tablet (80 mg total) by mouth daily at 6 PM. 10/06/19   Noralee Stain, DO  ferrous sulfate 325 (65 FE) MG tablet Take 325 mg by mouth daily with breakfast.    [provider]  levothyroxine (SYNTHROID) 100 MCG tablet Take 1 tablet (100 mcg total) by mouth daily before breakfast. 10/06/19   Noralee Stain, DO  lisinopril (ZESTRIL) 10 MG tablet Take 10 mg by mouth daily. 09/28/19   [provider]    Physical Exam: Vitals:   10/09/19 1500  10/09/19 1504 10/09/19 1515 10/09/19 1530  BP: 131/65  (!) 128/57 134/66  Pulse: 86  87 87  Resp: 19  14 14   Temp:  98.1 F (36.7 C)    TempSrc:  Oral    SpO2: 100%  100% 100%    Constitutional: NAD, calm, comfortable Vitals:   10/09/19 1500 10/09/19 1504 10/09/19 1515 10/09/19 1530  BP: 131/65  (!) 128/57 134/66  Pulse: 86  87 87  Resp: 19  14 14   Temp:  98.1 F (36.7 C)    TempSrc:  Oral    SpO2: 100%  100% 100%   Eyes: PERRL, lids and conjunctivae normal ENMT: Mucous membranes are moist. Posterior pharynx clear of any exudate or lesions.Normal dentition.  Neck: normal, supple, no masses, no thyromegaly Respiratory: clear to auscultation bilaterally, no wheezing, no crackles. Normal respiratory effort. No accessory muscle use.  Cardiovascular: Regular rate and rhythm, no murmurs / rubs / gallops. No extremity edema. 2+ pedal pulses. No carotid bruits.  Abdomen: no tenderness, no masses palpated. No hepatosplenomegaly. Bowel sounds positive.  Musculoskeletal: no clubbing / cyanosis. No joint deformity upper and lower extremities. Good ROM, no contractures. Normal muscle tone.  Skin: no rashes, lesions, ulcers. No induration Neurologic: Right sided weakness compared to left. No facial droop or tongue deviation noticed. Psychiatric: Normal judgment and insight. Alert and oriented x 3. Normal mood.     Labs on Admission: I have personally reviewed following labs and imaging studies  CBC: Recent Labs  Lab 10/03/19 0445 10/04/19 0846 10/09/19 1337  WBC 8.4  --   --   HGB 10.1*  --  10.9*  HCT 32.2* 30.2* 32.0*  MCV 76.7*  --   --   PLT 524*  --   --    Basic Metabolic Panel: Recent Labs  Lab 10/03/19 0445 10/05/19 0827 10/06/19 0144 10/09/19 1337 10/09/19 1357  NA 140 140 142 144 144  K 3.3* 3.2* 3.7 3.4* 3.5  CL 101 106 107 108 108  CO2 24 23 24   --  21*  GLUCOSE 180* 155* 98 101* 99  BUN 9 5* <5* 10 11  CREATININE 0.79 0.59 0.69 0.50 0.75  CALCIUM 9.3 8.9  8.9  --  9.5  MG  --  1.6* 2.0  --   --    GFR: Estimated Creatinine Clearance: 79.8 mL/min (by C-G formula based on SCr of 0.75 mg/dL). Liver Function Tests: Recent Labs  Lab 10/03/19 0445 10/09/19 1357  AST 23 30  ALT 21 29  ALKPHOS 137* 131*  BILITOT 0.6 1.0  PROT 7.5 7.4  ALBUMIN 3.1* 3.3*   No results for input(s): LIPASE, AMYLASE in the last 168 hours. No results for input(s): AMMONIA in the last 168 hours. Coagulation Profile: Recent Labs  Lab 10/03/19 0921 10/09/19 1357  INR 1.1 1.1   Cardiac Enzymes: No results for input(s): CKTOTAL, CKMB, CKMBINDEX, TROPONINI in  the last 168 hours. BNP (last 3 results) No results for input(s): PROBNP in the last 8760 hours. HbA1C: No results for input(s): HGBA1C in the last 72 hours. CBG: Recent Labs  Lab 10/05/19 0824 10/05/19 1143 10/05/19 1628 10/05/19 2102 10/06/19 0626  GLUCAP 134* 80 106* 89 72   Lipid Profile: No results for input(s): CHOL, HDL, LDLCALC, TRIG, CHOLHDL, LDLDIRECT in the last 72 hours. Thyroid Function Tests: No results for input(s): TSH, T4TOTAL, FREET4, T3FREE, THYROIDAB in the last 72 hours. Anemia Panel: No results for input(s): VITAMINB12, FOLATE, FERRITIN, TIBC, IRON, RETICCTPCT in the last 72 hours. Urine analysis:    Component Value Date/Time   COLORURINE YELLOW 10/03/2019 0814   APPEARANCEUR CLEAR 10/03/2019 0814   LABSPEC <1.005 (L) 10/03/2019 0814   PHURINE 5.5 10/03/2019 0814   GLUCOSEU NEGATIVE 10/03/2019 0814   HGBUR NEGATIVE 10/03/2019 0814   BILIRUBINUR NEGATIVE 10/03/2019 0814   KETONESUR 15 (A) 10/03/2019 0814   PROTEINUR NEGATIVE 10/03/2019 0814   UROBILINOGEN 1.0 03/04/2007 1518   NITRITE NEGATIVE 10/03/2019 0814   LEUKOCYTESUR NEGATIVE 10/03/2019 0814    Radiological Exams on Admission: MR ANGIO HEAD WO CONTRAST  Result Date: 10/09/2019 CLINICAL DATA:  Worsening slurred speech. Recent pontine infarct. EXAM: MRI HEAD WITHOUT CONTRAST MRA HEAD WITHOUT CONTRAST  TECHNIQUE: Multiplanar, multiecho pulse sequences of the brain and surrounding structures were obtained without intravenous contrast. Angiographic images of the head were obtained using MRA technique without contrast. COMPARISON:  Head CT 10/09/2019, MRI 10/03/2019, and CTA 10/03/2019. Cerebral angiogram 10/05/2019. FINDINGS: MRI HEAD FINDINGS An axial T1 sequence was not performed. Brain: An acute infarct involving the central and left paracentral pons has mildly enlarged from the prior MRI. There are new single punctate acute infarcts in each cerebellar hemisphere, and there are numerous new punctate acute infarcts scattered throughout both cerebral hemispheres involving cortex and white matter of multiple different vascular distributions as well as involving the left thalamus. There is an unchanged single chronic microhemorrhage in the right temporal lobe. No mass, midline shift, or extra-axial fluid collection is identified. A background of mild chronic small vessel ischemia is again noted in the cerebral white matter. The ventricles and sulci are within normal limits for age. Vascular: Unchanged abnormal appearance of the distal left vertebral artery. Skull and upper cervical spine: Unremarkable bone marrow signal. Sinuses/Orbits: Unremarkable orbits. Paranasal sinuses and mastoid air cells are clear. Other: None. MRA HEAD FINDINGS The visualized distal right vertebral artery is widely patent and supplies the basilar. The proximal left V4 segment remains occluded. There is retrograde opacification of the distal left V4 segment supplying the left PICA. Patent AICAs and SCAs are seen bilaterally. The basilar artery is patent with a mild stenosis of its midportion as previously seen. There is a moderate-sized right posterior communicating artery. Both PCAs are patent without evidence of significant proximal stenosis. The internal carotid arteries are patent from skull base to carotid termini with similar  appearance of bilateral ICA stenoses near the posterior genu, moderate to severe on the left and mild-to-moderate on the right. An infundibulum is noted at the left posterior communicating origin. ACAs and MCAs are patent without evidence of proximal branch occlusion or significant proximal stenosis. No aneurysm is identified. IMPRESSION: 1. Mild enlargement of a pontine infarct since the 10/03/2019 MRI. 2. Numerous new punctate acute infarcts throughout both cerebral and cerebellar hemispheres. 3. Unchanged intracranial atherosclerosis including mild to moderate right and moderate to severe left ICA stenoses and a mild mid basilar artery stenosis.  4. 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 Electronically Signed   By: Sebastian Ache M.D.   On: 10/09/2019 15:21   MR BRAIN WO CONTRAST  Result Date: 10/09/2019 CLINICAL DATA:  Worsening slurred speech. Recent pontine infarct. EXAM: MRI HEAD WITHOUT CONTRAST MRA HEAD WITHOUT CONTRAST TECHNIQUE: Multiplanar, multiecho pulse sequences of the brain and surrounding structures were obtained without intravenous contrast. Angiographic images of the head were obtained using MRA technique without contrast. COMPARISON:  Head CT 10/09/2019, MRI 10/03/2019, and CTA 10/03/2019. Cerebral angiogram 10/05/2019. FINDINGS: MRI HEAD FINDINGS An axial T1 sequence was not performed. Brain: An acute infarct involving the central and left paracentral pons has mildly enlarged from the prior MRI. There are new single punctate acute infarcts in each cerebellar hemisphere, and there are numerous new punctate acute infarcts scattered throughout both cerebral hemispheres involving cortex and white matter of multiple different vascular distributions as well as involving the left thalamus. There is an unchanged single chronic microhemorrhage in the right temporal lobe. No mass, midline shift, or extra-axial fluid collection is identified. A background of mild chronic small vessel  ischemia is again noted in the cerebral white matter. The ventricles and sulci are within normal limits for age. Vascular: Unchanged abnormal appearance of the distal left vertebral artery. Skull and upper cervical spine: Unremarkable bone marrow signal. Sinuses/Orbits: Unremarkable orbits. Paranasal sinuses and mastoid air cells are clear. Other: None. MRA HEAD FINDINGS The visualized distal right vertebral artery is widely patent and supplies the basilar. The proximal left V4 segment remains occluded. There is retrograde opacification of the distal left V4 segment supplying the left PICA. Patent AICAs and SCAs are seen bilaterally. The basilar artery is patent with a mild stenosis of its midportion as previously seen. There is a moderate-sized right posterior communicating artery. Both PCAs are patent without evidence of significant proximal stenosis. The internal carotid arteries are patent from skull base to carotid termini with similar appearance of bilateral ICA stenoses near the posterior genu, moderate to severe on the left and mild-to-moderate on the right. An infundibulum is noted at the left posterior communicating origin. ACAs and MCAs are patent without evidence of proximal branch occlusion or significant proximal stenosis. No aneurysm is identified. IMPRESSION: 1. Mild enlargement of a pontine infarct since the 10/03/2019 MRI. 2. Numerous new punctate acute infarcts throughout both cerebral and cerebellar hemispheres. 3. Unchanged intracranial atherosclerosis including mild to moderate right and moderate to severe left ICA stenoses and a mild mid basilar artery stenosis. 4. 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 Electronically Signed   By: Sebastian Ache M.D.   On: 10/09/2019 15:21   CT HEAD CODE STROKE WO CONTRAST  Result Date: 10/09/2019 CLINICAL DATA:  Code stroke.  Slurred speech.  Recent stroke. EXAM: CT HEAD WITHOUT CONTRAST TECHNIQUE: Contiguous axial images were obtained  from the base of the skull through the vertex without intravenous contrast. COMPARISON:  MRI head 10/03/2019 FINDINGS: Brain: Hypodensity in the central and ventral pons similar to the prior MRI compatible with subacute infarct. 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 Negative for new  infarct.  Negative for acute hemorrhage or mass. Vascular: Negative for hyperdense vessel Skull: Negative Sinuses/Orbits: Negative Other: None ASPECTS ([place] Stroke Program Early CT Score) - Ganglionic level infarction (caudate, lentiform nuclei, internal capsule, insula, M1-M3 cortex): 7 - Supraganglionic infarction (M4-M6 cortex): 3 Total score (0-10 with 10 being normal): 10 IMPRESSION: 1. Recent infarct in the central pons unchanged. No new  area of infarct or hemorrhage. 2. ASPECTS is 10 3. These results were called by telephone at the time of interpretation on 10/09/2019 at 1:47 pm to provider Rory Percy , who verbally acknowledged these results. Electronically Signed   By: Franchot Gallo M.D.   On: 10/09/2019 13:48    EKG: Independently reviewed. 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 Agreed with Neuro, may proceed with anticoagulation despite no direct evidence of a-fib (Supposed to get outpt Holter hooked up) Continue ASA and Plavix Speech evaluation PT OT Allow permissive HTN for 2-3 days PRN BP meds only TTE and TEE was done within the week, no PFO or atrium thromboses detected.  Aphagia with questionable dysphagia 2nd to stroke NPO Aspiration precautions Speech evaluation  Borderline hypokalemia Given we are suspecting a-fib, will replace and recheck, check Mg too.  IIDM Insulin for tighter glucose control for now.  HLD High dose statin  DVT prophylaxis: Heparin SubQ Code Status: Full Family Communication: Daughter at  bedside Disposition Plan: Home once more stable Consults called:Neuro Admission status: Tele admission   Lequita Halt MD [facility] Pager [phone]    10/09/2019, 4:25 PM

## 2019-10-09 NOTE — ED Notes (Signed)
Patient in for STAT MRI

## 2019-10-09 NOTE — Consult Note (Addendum)
NEURO HOSPITALIST  CONSULT   Requesting Physician: Dr. Charm Barges    Chief Complaint: slurred speech  History obtained from:  Patient  / Chart   HPI:                                                                                                                                         Amy Raymond is an 54 y.o. female  CVA ( 08/2019, 09/2019), HTN, HLD), DM who presented to [facility] ED as a code stroke for slurred speech.   Patient was just discharged 10/03/19 s/p CVA. Was discharged with right side deficits. today she was home and about 1250 her speech became slurred. EMS was called. Per patient and EMS speech is slurred, but has improved some on the way to the hospital. Patient on ASA 325 mg and Plavix daily.  ED course:  CTH: no hemorrhage MRI: mild enlargement of pontine infarct since 10/03/19. Several new punctate acute infarcts both cerebral and cerebellar hemispheres.   Chart review of past stroke:  MRI 10/03/19 acute Pontine infarct MRI: 1/21 punctate parieto-occipital bilaterally, chronic left thalamic lacunar infarct MRA: No LVO  Date last known well: 09/13/19 Time last known well: 1250 tPA Given: no; CVA 5 days ago Modified Rankin: Rankin Score=1 NIHSS:6   Past Medical History:  Diagnosis Date  . CVA (cerebral vascular accident) (HCC)    09/11/19; 10/03/19  . Diabetes mellitus without complication (HCC)   . Dyslipidemia   . Hypertension   . Hypothyroidism (acquired)     Past Surgical History:  Procedure Laterality Date  . BUBBLE STUDY  10/04/2019   Procedure: BUBBLE STUDY;  Surgeon: Chilton Si, MD;  Location: [facility] ENDOSCOPY;  Service: Cardiovascular;;  . IR ANGIO INTRA EXTRACRAN SEL INTERNAL CAROTID BILAT MOD SED  10/05/2019  . IR ANGIO VERTEBRAL SEL VERTEBRAL UNI R MOD SED  10/05/2019  . IR US GUIDE VASC ACCESS RIGHT  10/05/2019  . TEE WITHOUT CARDIOVERSION N/A 10/04/2019   Procedure: TRANSESOPHAGEAL ECHOCARDIOGRAM (TEE);   Surgeon: Chilton Si, MD;  Location: [facility] ENDOSCOPY;  Service: Cardiovascular;  Laterality: N/A;  . TUBAL LIGATION      Family History  Problem Relation Age of Onset  . Asthma Mother   . Thrombocytopenia Mother   . Dementia Father   . Stroke Neg Hx        Social History:  reports that she has never smoked. She has never used smokeless tobacco. She reports that she does not drink alcohol or use drugs.  Allergies: No Known Allergies  Medications:  Current Facility-Administered Medications  Medication Dose Route Frequency Provider Last Rate Last Admin  . sodium chloride flush (NS) 0.9 % injection 3 mL  3 mL Intravenous Once Terrilee Files, MD       Current Outpatient Medications  Medication Sig Dispense Refill  . aspirin EC 325 MG EC tablet Take 1 tablet (325 mg total) by mouth daily. 30 tablet 2  . atorvastatin (LIPITOR) 80 MG tablet Take 1 tablet (80 mg total) by mouth daily at 6 PM. 30 tablet 2  . cholecalciferol (VITAMIN D3) 25 MCG (1000 UNIT) tablet Take 1,000 Units by mouth daily.    . clopidogrel (PLAVIX) 75 MG tablet Take 1 tablet (75 mg total) by mouth daily. 30 tablet 3  . ferrous sulfate 325 (65 FE) MG tablet Take 325 mg by mouth daily with breakfast.    . glipiZIDE (GLUCOTROL) 10 MG tablet Take 10 mg by mouth 2 (two) times daily.    Marland Kitchen levothyroxine (SYNTHROID) 100 MCG tablet Take 1 tablet (100 mcg total) by mouth daily before breakfast. 30 tablet 2  . Linagliptin-Metformin HCl (JENTADUETO) 2.12-998 MG TABS Take 1 tablet by mouth 2 (two) times daily.      Marland Kitchen lisinopril (ZESTRIL) 10 MG tablet Take 10 mg by mouth daily.    . ondansetron (ZOFRAN) 4 MG tablet Take 1 tablet (4 mg total) by mouth every 6 (six) hours as needed for nausea. 20 tablet 0     ROS:                                                                                                                                        ROS was performed and is negative except as noted in HPI    General Examination:                                                                                                      There were no vitals taken for this visit.  Physical Exam  Constitutional: Appears well-developed and well-nourished.  Psych: Affect appropriate to situation Eyes: Normal external eye and conjunctiva. HENT: Normocephalic, no lesions, without obvious abnormality.   Musculoskeletal-no joint tenderness, deformity or swelling Cardiovascular: Normal rate and regular rhythm.  Respiratory: Effort normal, non-labored breathing saturations WNL GI: Soft.  No distension. There is no tenderness.  Skin: WDI  Neurological Examination Mental Status: Alert, oriented, thought content appropriate.  Speech fluent without evidence of aphasia.  Very Dysarthric Able to follow commands without difficulty. Cranial  Nerves: UD:JSHFWY fields grossly normal,  III,IV, VI: ptosis not present, extra-ocular motions intact bilaterally, pupils equal, round, reactive to light and accommodation V,VII: smile asymmetric, right facial droop facial light touch sensation normal bilaterally VIII: hearing normal bilaterally IX,X: uvula rises midline XI: bilateral shoulder shrug XII: midline tongue extension Motor: Right : Upper extremity   5/5  Left:     Upper extremity   5/5  Lower extremity   5/5   Lower extremity   5/5 Tone and bulk:normal tone throughout; no atrophy noted Sensory:  light touch intact throughout, bilaterally Deep Tendon Reflexes: 2+ and symmetric biceps and patella Cerebellar: Dysmetria present in all 4 limbs Gait: deferred   Lab Results: Basic Metabolic Panel: Recent Labs  Lab 10/03/19 0445 10/05/19 0827 10/06/19 0144 10/09/19 1337  NA 140 140 142 144  K 3.3* 3.2* 3.7 3.4*  CL 101 106 107 108  CO2 24 23 24   --   GLUCOSE 180* 155* 98 101*  BUN 9 5* <5* 10  CREATININE 0.79  0.59 0.69 0.50  CALCIUM 9.3 8.9 8.9  --   MG  --  1.6* 2.0  --     CBC: Recent Labs  Lab 10/03/19 0445 10/04/19 0846 10/09/19 1337  WBC 8.4  --   --   HGB 10.1*  --  10.9*  HCT 32.2* 30.2* 32.0*  MCV 76.7*  --   --   PLT 524*  --   --    CBG: Recent Labs  Lab 10/05/19 0824 10/05/19 1143 10/05/19 1628 10/05/19 2102 10/06/19 0626  GLUCAP 134* 80 106* 89 72    Imaging: CT HEAD CODE STROKE WO CONTRAST  Result Date: 10/09/2019 CLINICAL DATA:  Code stroke.  Slurred speech.  Recent stroke. EXAM: CT HEAD WITHOUT CONTRAST TECHNIQUE: Contiguous axial images were obtained from the base of the skull through the vertex without intravenous contrast. COMPARISON:  MRI head 10/03/2019 FINDINGS: Brain: Hypodensity in the central and ventral pons similar to the prior MRI compatible with subacute infarct. 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 Negative for new  infarct.  Negative for acute hemorrhage or mass. Vascular: Negative for hyperdense vessel Skull: Negative Sinuses/Orbits: Negative Other: None ASPECTS (Alberta Stroke Program Early CT Score) - Ganglionic level infarction (caudate, lentiform nuclei, internal capsule, insula, M1-M3 cortex): 7 - Supraganglionic infarction (M4-M6 cortex): 3 Total score (0-10 with 10 being normal): 10 IMPRESSION: 1. Recent infarct in the central pons unchanged. No new area of infarct or hemorrhage. 2. ASPECTS is 10 3. These results were called by telephone at the time of interpretation on 10/09/2019 at 1:47 pm to provider 10/11/2019 , who verbally acknowledged these results. Electronically Signed   By: Wilford Corner M.D.   On: 10/09/2019 13:48       10/11/2019, MSN, NP-C [facility] [phone]  10/09/2019, 1:41 PM    Assessment: 54 y.o. female with CVA in January and February 2021, HTN, HLD, DM who presented to [facility] ED as a code stroke for slurred speech. She was discharged 5 days ago after a pontine stroke with  residual right side deficits and some right side dysmetria. No facial droop noted. On presentation today, she has a right facial droop and dysmetria in all 4 extremities. CTH did not show a hemorrhage. MRI mildly enlarged pontine stroke, and numerous new punctate infarcts in bilateral cerebral and cerebellar hemispheres.  patient is not a candidate for TPA given pontine stroke 5 days ago.MRA brain without LVO, so not a  candidate for IR. MRI brain with extension of the pontine infarct since 2019-09-13 as well as new multiple scattered embolic strokes in both anterior and posterior circulations bilaterally. She has an extensive work-up including TEE, lower extremity Dopplers, CT MRI etc. but no real cause has been identified for stroke and she has had strokes in January, late February and new strokes now. 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  Stroke Risk Factors - diabetes mellitus, hyperlipidemia and hypertension    Recommendations: -Continue ASA 325 mg and Plavix 75 mg daily -TCD with bubbles -PT OT speech therapy (she is severely ataxic and dysarthric) -Frequent neurochecks  --please page stroke NP  Or  PA  Or MD from 8am -4 pm  as this patient from this time will be  followed by the stroke.   You can look them up on [email or web address]  [place]   Attending Neurohospitalist Addendum Patient seen and examined with APP/Resident. Agree with the history and physical as documented above. Agree with the plan as documented, which I helped formulate. I have independently reviewed the chart, obtained history, review of systems and examined the patient.I have personally reviewed pertinent head/neck/spine imaging (CT/MRI). Please feel free to call with any questions. --- Amie Portland, MD [facility] Pager: [phone]  If 7pm to 7am, please call on call as listed on AMION.

## 2019-10-10 LAB — MAGNESIUM: Magnesium: 1.8 mg/dL (ref 1.7–2.4)

## 2019-10-10 LAB — GLUCOSE, CAPILLARY
Glucose-Capillary: 79 mg/dL (ref 70–99)
Glucose-Capillary: 75 mg/dL (ref 70–99)
Glucose-Capillary: 89 mg/dL (ref 70–99)
Glucose-Capillary: 144 mg/dL — ABNORMAL HIGH (ref 70–99)
Glucose-Capillary: 81 mg/dL (ref 70–99)
Glucose-Capillary: 194 mg/dL — ABNORMAL HIGH (ref 70–99)

## 2019-10-10 LAB — BASIC METABOLIC PANEL
Anion gap: 13 (ref 5–15)
BUN: 9 mg/dL (ref 6–20)
CO2: 21 mmol/L — ABNORMAL LOW (ref 22–32)
Calcium: 9 mg/dL (ref 8.9–10.3)
Chloride: 109 mmol/L (ref 98–111)
Creatinine, Ser: 0.62 mg/dL (ref 0.44–1.00)
GFR calc Af Amer: >60 mL/min (ref >60)
GFR calc non Af Amer: >60 mL/min (ref >60)
Glucose, Bld: 105 mg/dL — ABNORMAL HIGH (ref 70–99)
Potassium: 3.4 mmol/L — ABNORMAL LOW (ref 3.5–5.1)
Sodium: 143 mmol/L (ref 135–145)

## 2019-10-10 MED ORDER — RESOURCE THICKENUP CLEAR PO POWD
ORAL | Status: DC | PRN
  Filled 2019-10-10: qty 125

## 2019-10-10 NOTE — Plan of Care (Signed)
  Problem: Education: Goal: Knowledge of secondary prevention will improve Outcome: Progressing   Problem: Education: Goal: Knowledge of patient specific risk factors addressed and post discharge goals established will improve Outcome: Progressing   Problem: Education: Goal: Knowledge of disease or condition will improve Outcome: Progressing   

## 2019-10-10 NOTE — Progress Notes (Signed)
Rehab Admissions Coordinator Note:  Per PT/OT recommendation, patient was screened by Stephania Fragmin for appropriateness for an Inpatient Acute Rehab Consult.  At this time, we are recommending Inpatient Rehab consult. I will place an order per our protocol.   Stephania Fragmin 10/10/2019, 4:54 PM  I can be reached at [phone].

## 2019-10-10 NOTE — Evaluation (Signed)
Physical Therapy Evaluation Patient Details Name: Amy Raymond MRN: [id number] DOB: 04/04/1966 Today's Date: 10/10/2019   History of Present Illness   Amy Raymond is a 54 y.o. 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   Clinical Impression  Pt admitted with above diagnosis. Pt presents with slurred speech and facial asymmetry as well as worsened control of RUE and RLE. Pt left hospital last week without needed assist or AD for ambulation, now mod A for ambulation without AD and min A with RW. Pt very motivated and would benefit from intense rehab with multidisciplinary approach.   Pt currently with functional limitations due to the deficits listed below (see PT Problem List). Pt will benefit from skilled PT to increase their independence and safety with mobility to allow discharge to the venue listed below.       Follow Up Recommendations CIR;Supervision/Assistance - 24 hour    Equipment Recommendations  Other (comment)(TBD)    Recommendations for Other Services Rehab consult     Precautions / Restrictions Precautions Precautions: Fall Precaution Comments: fell before presenting to ED Restrictions Weight Bearing Restrictions: No      Mobility  Bed Mobility Overal bed mobility: Needs Assistance Bed Mobility: Supine to Sit     Supine to sit: Min guard     General bed mobility comments: Min Guard A for safety  Transfers Overall transfer level: Needs assistance Equipment used: None Transfers: Sit to/from Stand Sit to Stand: Min assist         General transfer comment: Min A to gain balance in standing. Min A for power up from toilet  Ambulation/Gait Ambulation/Gait assistance: Min assist;Mod assist Gait Distance (Feet): 100 Feet Assistive device: Rolling walker (2 wheeled);None Gait Pattern/deviations: Ataxic;Staggering right Gait velocity: decreased Gait velocity  interpretation: 1.31 - 2.62 ft/sec, indicative of limited community ambulator General Gait Details: without RW pt required mod A due to poor coordination RLE with ataxic mvmts. With RW, pt was able to ambulate with min A but continues to have ataxic mvmts with R lean and occasional stagger to R  Stairs   Stairs assistance: Min guard Stair Management: Two rails;Forwards;Step to pattern   General stair comments: Cues for sequencing to regress to step to pattern due to R quad weakness.  Wheelchair Mobility    Modified Rankin (Stroke Patients Only) Modified Rankin (Stroke Patients Only) Pre-Morbid Rankin Score: Moderate disability Modified Rankin: Moderately severe disability     Balance Overall balance assessment: Needs assistance Sitting-balance support: No upper extremity supported;Feet supported Sitting balance-Leahy Scale: Fair Sitting balance - Comments: pt with posterior lean in sitting and needed vc's to prevent post LOB   Standing balance support: No upper extremity supported Standing balance-Leahy Scale: Poor Standing balance comment: Reliant on UE support and physical A                  Standardized Balance Assessment Standardized Balance Assessment : Berg Balance Test Berg Balance Test Sit to Stand: Able to stand without using hands and stabilize independently Standing Unsupported: Able to stand safely 2 minutes Sitting with Back Unsupported but Feet Supported on Floor or Stool: Able to sit safely and securely 2 minutes Stand to Sit: Sits safely with minimal use of hands Transfers: Able to transfer safely, minor use of hands Standing Unsupported with Eyes Closed: Able to stand 10 seconds safely Standing Ubsupported with Feet Together: Able to place feet together independently and  stand for 1 minute with supervision From Standing, Reach Forward with Outstretched Arm: Can reach confidently >25 cm (10") From Standing Position, Pick up Object from Floor: Able to  pick up shoe safely and easily From Standing Position, Turn to Look Behind Over each Shoulder: Looks behind from both sides and weight shifts well Turn 360 Degrees: Able to turn 360 degrees safely in 4 seconds or less Standing Unsupported, Alternately Place Feet on Step/Stool: Able to stand independently and complete 8 steps >20 seconds Standing Unsupported, One Foot in Front: Able to plae foot ahead of the other independently and hold 30 seconds Standing on One Leg: Able to lift leg independently and hold 5-10 seconds Total Score: 52 Dynamic Gait Index Level Surface: Normal Change in Gait Speed: Normal Gait with Horizontal Head Turns: Normal Gait with Vertical Head Turns: Normal Gait and Pivot Turn: Mild Impairment Step Over Obstacle: Normal Step Around Obstacles: Normal Steps: Normal Total Score: 23       Pertinent Vitals/Pain Pain Assessment: No/denies pain Faces Pain Scale: No hurt    Home Living Family/patient expects to be discharged to:: Private residence Living Arrangements: Parent Available Help at Discharge: Family;Available 24 hours/day Type of Home: House Home Access: Stairs to enter   [employer] of Steps: 3 Home Layout: One level Home Equipment: None Additional Comments: Was staying at mother's home prior to this admission.    Prior Function Level of Independence: Independent         Comments: she noticed her speech deficits with this CVA. After first CVA was living alone but not back to full energy level. After 2nd one, went home with her mother.      Hand Dominance   Dominant Hand: Right    Extremity/Trunk Assessment   Upper Extremity Assessment Upper Extremity Assessment: Defer to OT evaluation RUE Deficits / Details: Decreased grip strength. Poor finger opposition requiring significant time to complete. Able to perform AROM throughout. Limited shoulder ROM (0-80*) with compensatory hiking RUE Sensation: decreased proprioception RUE  Coordination: decreased fine motor;decreased gross motor    Lower Extremity Assessment Lower Extremity Assessment: RLE deficits/detail RLE Deficits / Details: pt tests WFL strength R and L but had some RLE apraxia with last CVA that is now worsened RLE Sensation: decreased proprioception RLE Coordination: decreased gross motor    Cervical / Trunk Assessment Cervical / Trunk Assessment: Other exceptions Cervical / Trunk Exceptions: poor trunk control noted in sitting and standing  Communication   Communication: Expressive difficulties(slurring)  Cognition Arousal/Alertness: Awake/alert Behavior During Therapy: WFL for tasks assessed/performed Overall Cognitive Status: Impaired/Different from baseline Area of Impairment: Following commands;Problem solving                       Following Commands: Follows one step commands with increased time;Follows one step commands consistently     Problem Solving: Requires verbal cues General Comments: Pt highly motivated to participate in therapy. Following simple commands with increased time. Cues for safety to "slow down" as pt moves quickly normally but now has decreased coordination and balance. R inattention noted as well      General Comments General comments (skin integrity, edema, etc.): pt's mother present end of session    Exercises Other Exercises Other Exercises: issued fine motor/coordination HEP and reviewed with pt    Assessment/Plan    PT Assessment Patient needs continued PT services  PT Problem List Decreased balance;Decreased mobility;Decreased coordination;Decreased cognition;Decreased knowledge of use of DME;Decreased safety awareness;Decreased knowledge of precautions  PT Treatment Interventions DME instruction;Gait training;Stair training;Functional mobility training;Therapeutic activities;Therapeutic exercise;Balance training;Patient/family education;Neuromuscular re-education;Cognitive remediation     PT Goals (Current goals can be found in the Care Plan section)  Acute Rehab PT Goals Patient Stated Goal: home, get energy back PT Goal Formulation: With patient Time For Goal Achievement: 10/24/19 Potential to Achieve Goals: Good    Frequency Min 4X/week   Barriers to discharge Decreased caregiver support      Co-evaluation PT/OT/SLP Co-Evaluation/Treatment: Yes Reason for Co-Treatment: Complexity of the patient's impairments (multi-system involvement);Necessary to address cognition/behavior during functional activity;For patient/therapist safety PT goals addressed during session: Mobility/safety with mobility;Balance;Proper use of DME         AM-PAC PT "6 Clicks" Mobility  Outcome Measure Help needed turning from your back to your side while in a flat bed without using bedrails?: None Help needed moving from lying on your back to sitting on the side of a flat bed without using bedrails?: None Help needed moving to and from a bed to a chair (including a wheelchair)?: A Little Help needed standing up from a chair using your arms (e.g., wheelchair or bedside chair)?: A Little Help needed to walk in hospital room?: A Lot Help needed climbing 3-5 steps with a railing? : A Lot 6 Click Score: 18    End of Session Equipment Utilized During Treatment: Gait belt Activity Tolerance: Patient tolerated treatment well Patient left: in chair;with chair alarm set;with call bell/phone within reach Nurse Communication: Mobility status PT Visit Diagnosis: Other abnormalities of gait and mobility (R26.89);Ataxic gait (R26.0)    Time: 6384-5364 PT Time Calculation (min) (ACUTE ONLY): 23 min   Charges:   PT Evaluation $PT Eval Moderate Complexity: 1 Mod          Lyanne Co, PT  Acute Rehab Services  Pager [phone] Office [phone]   Lawana Chambers Mackenzy Grumbine 10/10/2019, 2:13 PM

## 2019-10-10 NOTE — Evaluation (Signed)
Speech Language Pathology Evaluation Patient Details Name: Amy Raymond MRN: [id number] DOB: 12-18-1965 Today's Date: 10/10/2019 Time: 2119-4174 SLP Time Calculation (min) (ACUTE ONLY): 18 min  Problem List:  Patient Active Problem List   Diagnosis Date Noted  . Embolic stroke (HCC) 10/03/2019  . Hypothyroidism (acquired)   . Hypertension   . Dyslipidemia   . Diabetes mellitus without complication (HCC)   . Intractable headache 09/11/2019  . Hypothyroidism 09/11/2019  . Type 2 diabetes mellitus without complication (HCC) 09/11/2019  . Acute CVA (cerebrovascular accident) (HCC) 09/11/2019  . Elevated liver enzymes 09/11/2019   Past Medical History:  Past Medical History:  Diagnosis Date  . CVA (cerebral vascular accident) (HCC)    09/11/19; 10/03/19  . Diabetes mellitus without complication (HCC)   . Dyslipidemia   . Hypertension   . Hypothyroidism (acquired)    Past Surgical History:  Past Surgical History:  Procedure Laterality Date  . BUBBLE STUDY  10/04/2019   Procedure: BUBBLE STUDY;  Surgeon: Chilton Si, MD;  Location: [facility] ENDOSCOPY;  Service: Cardiovascular;;  . IR ANGIO INTRA EXTRACRAN SEL INTERNAL CAROTID BILAT MOD SED  10/05/2019  . IR ANGIO VERTEBRAL SEL VERTEBRAL UNI R MOD SED  10/05/2019  . IR US GUIDE VASC ACCESS RIGHT  10/05/2019  . TEE WITHOUT CARDIOVERSION N/A 10/04/2019   Procedure: TRANSESOPHAGEAL ECHOCARDIOGRAM (TEE);  Surgeon: Chilton Si, MD;  Location: [facility] ENDOSCOPY;  Service: Cardiovascular;  Laterality: N/A;  . TUBAL LIGATION     HPI:  Amy Raymond is a 54 y.o. 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 Patient was discharged on 10/03/19 after second CVA in one month. She does have a residue right side weakness. Daughter noticed that on Friday, pt has had episodes of slurred speech and  today she was home and about 1250 her speech became slurred speech,  confusion and dizziness, also seemed have some trouble swallowing. MRI reported: "1. Mild enlargement of a pontine infarct since the 10/03/2019 MRI. 2. Numerous new punctate acute infarcts throughout both cerebral and cerebellar hemispheres." Pt completed a BSE on 09/11/19 with recommendations for regular solids and thin liquids.    Assessment / Plan / Recommendation Clinical Impression  Pt was seen for a cognitive-linguistic evaluation in the setting of enlargement of a previous pontine infarct and numerous acute punctate infarcts throughout both cerebral and cerebellar hemispheres.  She was seen for a cognitive-linguistic evaluation on 10/03/19 and she presented with functional abilities at that time.  Pt exhibited mild deficits in functional problem solving, repetition, and complex yes/no questions.  Pt additionally exhibited moderate dysarthria with decreased speech intelligibility.  Her speech was approximately 80% intelligible at the word level, decreasing to approximately 70% at the phrase and sentence level.   Her dysarthria appears to have worsened since her last evaluation per chart review; however, pt stated that her speech was at baseline.  Recommend additional ST (outpatient vs home health) and assistance with IADLs (meds, finances, etc.) at time of discharge.  SLP will f/u acutely for cognitive-linguistic treatment.      SLP Assessment  SLP Recommendation/Assessment: Patient needs continued Speech Lanaguage Pathology Services SLP Visit Diagnosis: Cognitive communication deficit (R41.841);Dysarthria and anarthria (R47.1)    Follow Up Recommendations  Home health SLP;Outpatient SLP    Frequency and Duration min 2x/week  2 weeks      SLP Evaluation Cognition  Overall Cognitive Status: Impaired/Different from baseline Arousal/Alertness: Awake/alert Orientation Level: Oriented  X4 Attention: Sustained;Focused Focused Attention: Appears intact Sustained Attention: Appears intact Memory:  Appears intact Immediate Memory Recall: Sock;Blue;Bed Memory Recall Sock: Without Cue Memory Recall Blue: Without Cue Memory Recall Bed: Without Cue Awareness: Appears intact Problem Solving: Impaired Problem Solving Impairment: Verbal complex Safety/Judgment: Appears intact       Comprehension  Auditory Comprehension Overall Auditory Comprehension: Impaired Yes/No Questions: Impaired Basic Biographical Questions: 76-100% accurate Basic Immediate Environment Questions: 75-100% accurate Complex Questions: 50-74% accurate Commands: Within Functional Limits Conversation: Complex Reading Comprehension Reading Status: Not tested    Expression Expression Primary Mode of Expression: Verbal Verbal Expression Overall Verbal Expression: Impaired Initiation: No impairment Level of Generative/Spontaneous Verbalization: Conversation Repetition: Impaired Level of Impairment: Phrase level Naming: No impairment Pragmatics: No impairment Written Expression Dominant Hand: Right Written Expression: Not tested   Oral / Motor  Oral Motor/Sensory Function Overall Oral Motor/Sensory Function: Mild impairment Facial ROM: Reduced right;Reduced left Facial Symmetry: Abnormal symmetry right Lingual ROM: Within Functional Limits Lingual Symmetry: Abnormal symmetry right Motor Speech Overall Motor Speech: Impaired Respiration: Within functional limits Phonation: Normal Resonance: Within functional limits Articulation: Impaired Level of Impairment: Word Intelligibility: Intelligibility reduced Word: 75-100% accurate Phrase: 50-74% accurate Sentence: 50-74% accurate Conversation: 50-74% accurate   GO                   Colin Mulders., M.S., CCC-SLP Acute Rehabilitation Services Office: [phone]  Elvia Collum Aj [place] 10/10/2019, 10:13 AM

## 2019-10-10 NOTE — Progress Notes (Signed)
STROKE TEAM PROGRESS NOTE   INTERVAL HISTORY I personally reviewed history of presenting illness with the patient and mother, electronic medical records as well as imaging films in PACS.  She presented with increasing slurred speech which has persisted an MRI scan shows enlargement of recent pontine infarct from 10/03/2019 as well as several new punctate bilateral lacunar infarcts likely from small vessel disease. Vitals:   10/09/19 2320 10/10/19 0126 10/10/19 0401 10/10/19 0758  BP: 134/63  (!) 158/62 (!) 157/66  Pulse: 66 77 69 79  Resp: 20  18 18   Temp: 98.5 F (36.9 C)  98.4 F (36.9 C) 99.1 F (37.3 C)  TempSrc: Oral  Oral Oral  SpO2: 98% 100% 100% 99%  Weight:      Height:        CBC:  Recent Labs  Lab 10/09/19 1337 10/09/19 1633  WBC  --  11.0*  NEUTROABS  --  7.7  HGB 10.9* 10.2*  HCT 32.0* 32.9*  MCV  --  76.3*  PLT  --  582*    Basic Metabolic Panel:  Recent Labs  Lab 10/06/19 0144 10/09/19 1337 10/09/19 1357 10/10/19 0500  NA 142   < > 144 143  K 3.7   < > 3.5 3.4*  CL 107   < > 108 109  CO2 24  --  21* 21*  GLUCOSE 98   < > 99 105*  BUN <5*   < > 11 9  CREATININE 0.69   < > 0.75 0.62  CALCIUM 8.9  --  9.5 9.0  MG 2.0  --   --  1.8   < > = values in this interval not displayed.   Lipid Panel:     Component Value Date/Time   CHOL 172 09/12/2019 0211   TRIG 168 (H) 09/12/2019 0211   HDL 21 (L) 09/12/2019 0211   CHOLHDL 8.2 09/12/2019 0211   VLDL 34 09/12/2019 0211   LDLCALC 117 (H) 09/12/2019 0211   HgbA1c:  Lab Results  Component Value Date   HGBA1C 8.7 (H) 09/11/2019   Urine Drug Screen:     Component Value Date/Time   LABOPIA NONE DETECTED 10/03/2019 0814   COCAINSCRNUR NONE DETECTED 10/03/2019 0814   LABBENZ NONE DETECTED 10/03/2019 0814   AMPHETMU NONE DETECTED 10/03/2019 0814   THCU NONE DETECTED 10/03/2019 0814   LABBARB NONE DETECTED 10/03/2019 0814    Alcohol Level     Component Value Date/Time   ETH <10 10/03/2019 0445     IMAGING past 48 hours MR ANGIO HEAD WO CONTRAST  Result Date: 10/09/2019 CLINICAL DATA:  Worsening slurred speech. Recent pontine infarct. EXAM: MRI HEAD WITHOUT CONTRAST MRA HEAD WITHOUT CONTRAST TECHNIQUE: Multiplanar, multiecho pulse sequences of the brain and surrounding structures were obtained without intravenous contrast. Angiographic images of the head were obtained using MRA technique without contrast. COMPARISON:  Head CT 10/09/2019, MRI 10/03/2019, and CTA 10/03/2019. Cerebral angiogram 10/05/2019. FINDINGS: MRI HEAD FINDINGS An axial T1 sequence was not performed. Brain: An acute infarct involving the central and left paracentral pons has mildly enlarged from the prior MRI. There are new single punctate acute infarcts in each cerebellar hemisphere, and there are numerous new punctate acute infarcts scattered throughout both cerebral hemispheres involving cortex and white matter of multiple different vascular distributions as well as involving the left thalamus. There is an unchanged single chronic microhemorrhage in the right temporal lobe. No mass, midline shift, or extra-axial fluid collection is identified. A background of mild  chronic small vessel ischemia is again noted in the cerebral white matter. The ventricles and sulci are within normal limits for age. Vascular: Unchanged abnormal appearance of the distal left vertebral artery. Skull and upper cervical spine: Unremarkable bone marrow signal. Sinuses/Orbits: Unremarkable orbits. Paranasal sinuses and mastoid air cells are clear. Other: None. MRA HEAD FINDINGS The visualized distal right vertebral artery is widely patent and supplies the basilar. The proximal left V4 segment remains occluded. There is retrograde opacification of the distal left V4 segment supplying the left PICA. Patent AICAs and SCAs are seen bilaterally. The basilar artery is patent with a mild stenosis of its midportion as previously seen. There is a moderate-sized  right posterior communicating artery. Both PCAs are patent without evidence of significant proximal stenosis. The internal carotid arteries are patent from skull base to carotid termini with similar appearance of bilateral ICA stenoses near the posterior genu, moderate to severe on the left and mild-to-moderate on the right. An infundibulum is noted at the left posterior communicating origin. ACAs and MCAs are patent without evidence of proximal branch occlusion or significant proximal stenosis. No aneurysm is identified. IMPRESSION: 1. Mild enlargement of a pontine infarct since the 10/03/2019 MRI. 2. Numerous new punctate acute infarcts throughout both cerebral and cerebellar hemispheres. 3. Unchanged intracranial atherosclerosis including mild to moderate right and moderate to severe left ICA stenoses and a mild mid basilar artery stenosis. 4. 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 Electronically Signed   By: Sebastian Ache M.D.   On: 10/09/2019 15:21   MR BRAIN WO CONTRAST  Result Date: 10/09/2019 CLINICAL DATA:  Worsening slurred speech. Recent pontine infarct. EXAM: MRI HEAD WITHOUT CONTRAST MRA HEAD WITHOUT CONTRAST TECHNIQUE: Multiplanar, multiecho pulse sequences of the brain and surrounding structures were obtained without intravenous contrast. Angiographic images of the head were obtained using MRA technique without contrast. COMPARISON:  Head CT 10/09/2019, MRI 10/03/2019, and CTA 10/03/2019. Cerebral angiogram 10/05/2019. FINDINGS: MRI HEAD FINDINGS An axial T1 sequence was not performed. Brain: An acute infarct involving the central and left paracentral pons has mildly enlarged from the prior MRI. There are new single punctate acute infarcts in each cerebellar hemisphere, and there are numerous new punctate acute infarcts scattered throughout both cerebral hemispheres involving cortex and white matter of multiple different vascular distributions as well as involving the left  thalamus. There is an unchanged single chronic microhemorrhage in the right temporal lobe. No mass, midline shift, or extra-axial fluid collection is identified. A background of mild chronic small vessel ischemia is again noted in the cerebral white matter. The ventricles and sulci are within normal limits for age. Vascular: Unchanged abnormal appearance of the distal left vertebral artery. Skull and upper cervical spine: Unremarkable bone marrow signal. Sinuses/Orbits: Unremarkable orbits. Paranasal sinuses and mastoid air cells are clear. Other: None. MRA HEAD FINDINGS The visualized distal right vertebral artery is widely patent and supplies the basilar. The proximal left V4 segment remains occluded. There is retrograde opacification of the distal left V4 segment supplying the left PICA. Patent AICAs and SCAs are seen bilaterally. The basilar artery is patent with a mild stenosis of its midportion as previously seen. There is a moderate-sized right posterior communicating artery. Both PCAs are patent without evidence of significant proximal stenosis. The internal carotid arteries are patent from skull base to carotid termini with similar appearance of bilateral ICA stenoses near the posterior genu, moderate to severe on the left and mild-to-moderate on the right. An infundibulum is noted  at the left posterior communicating origin. ACAs and MCAs are patent without evidence of proximal branch occlusion or significant proximal stenosis. No aneurysm is identified. IMPRESSION: 1. Mild enlargement of a pontine infarct since the 10/03/2019 MRI. 2. Numerous new punctate acute infarcts throughout both cerebral and cerebellar hemispheres. 3. Unchanged intracranial atherosclerosis including mild to moderate right and moderate to severe left ICA stenoses and a mild mid basilar artery stenosis. 4. 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 Electronically Signed   By: Sebastian Ache M.D.   On: 10/09/2019 15:21    CT HEAD CODE STROKE WO CONTRAST  Result Date: 10/09/2019 CLINICAL DATA:  Code stroke.  Slurred speech.  Recent stroke. EXAM: CT HEAD WITHOUT CONTRAST TECHNIQUE: Contiguous axial images were obtained from the base of the skull through the vertex without intravenous contrast. COMPARISON:  MRI head 10/03/2019 FINDINGS: Brain: Hypodensity in the central and ventral pons similar to the prior MRI compatible with subacute infarct. 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 Negative for new  infarct.  Negative for acute hemorrhage or mass. Vascular: Negative for hyperdense vessel Skull: Negative Sinuses/Orbits: Negative Other: None ASPECTS (Alberta Stroke Program Early CT Score) - Ganglionic level infarction (caudate, lentiform nuclei, internal capsule, insula, M1-M3 cortex): 7 - Supraganglionic infarction (M4-M6 cortex): 3 Total score (0-10 with 10 being normal): 10 IMPRESSION: 1. Recent infarct in the central pons unchanged. No new area of infarct or hemorrhage. 2. ASPECTS is 10 3. These results were called by telephone at the time of interpretation on 10/09/2019 at 1:47 pm to provider Wilford Corner , who verbally acknowledged these results. Electronically Signed   By: Marlan Palau M.D.   On: 10/09/2019 13:48    PHYSICAL EXAM Pleasant middle-aged African-American lady not in distress. . Afebrile. Head is nontraumatic. Neck is supple without bruit.    Cardiac exam no murmur or gallop. Lungs are clear to auscultation. Distal pulses are well felt. Neurological Exam : She is awake alert oriented to time place and person.  Diminished attention, registration and recall.  Speech is dysarthric with pseudobulbar quality.  Right lower facial weakness.  Tongue midline.  Motor system exam reveals no upper or lower extremity drift but mild weakness of right grip and intrinsic hand muscles.  Orbits left over right upper extremity.  Diminished foot tapping on the right.  Deep tendon reflexes are brisk  bilaterally.  Sensation is intact.  Plantars are downgoing.  Gait not tested.   ASSESSMENT/PLAN Ms. Amy Raymond is a 54 y.o. female with history of stroke last week, HTN, HLD, DM presenting with slurred speech.   Stroke:  New punctate B cerebral and cerebellar infarcts in setting of central pontine infarct last week. New Infarcts embolic secondary to unknown source cryptogenic versus intracranial atherosclerosis pontine infarct felt to be secondary to small vessel disease when angio showed mild BA stenosis .   Code Stroke CT head recent central pontine infarct. ASPECTS 10.     MRI  Evolvement of central pontine infarct since 10/03/19. Numerous new punctate B cerebral and cerebellar infarcts.   MRA  Unchanged:  moderate to moderate R ICA stenosis, moderate to severe L ICA stenosis, mild mid BA stenosis, L VA occlusion   Heparin 5000 units sq tid for VTE prophylaxis  aspirin 325 mg daily and clopidogrel 75 mg daily prior to admission, now on aspirin 325 mg daily and clopidogrel 75 mg daily. Continue x 3 months then ASPIRIN alone   Therapy recommendations:  CIR  Disposition:  pending   Hx stroke/TIA  10/03/2019 - pontine infarct in setting of moderate to severe BA stenosis, infarct secondary to small vessel disease source. Cerebral angiogram mild BA stenosis w/ flow. L paraclinoid 54% stenosis.  09/11/2019 - episode of aphasia, vision loss and loss of consciousness.  EEG negative.  MRI tiny punctate foci of diffusion abnormality involving the parieto-occipital regions bilaterally as well as chronic left thalamic lacunar infarct. Unable to get TCD window.  CT head and neck hypoplastic left VA, moderate stenosis of mid BA, multifocal atherosclerosis bilateral carotid siphons. DVT negative.  EF 60 to 65%.  LDL 117 and A1c 8.7.  UDS negative.  Put on aspirin and plavix for 3 months given intracranial atherosclerosis.  On Lipitor 40. Recommend 30-day CardioNet monitoring as  outpatient.  Hypertension  Stable . Permissive hypertension (OK if < 220/120) but gradually normalize in 5-7 days . Long-term BP goal normotensive  Hyperlipidemia  Home meds:  Lipitor 80, resumed in hospital  LDL 117, goal < 70  Continue statin at discharge  Diabetes type II Uncontrolled  HgbA1c 8.7, goal < 7.0  Other Stroke Risk Factors  Migraines   Other Active Problems  Hypothyroidism   Hospital day # 1 Patient has presented with recurrent by cerebral cortical and subcortical infarcts exact etiology unclear but she does have intracranial atherosclerosis as well as small vessel disease.  She had a TEE during the recent admission for stroke which was negative and so far cardiac monitoring has not shown any paroxysmal A. fib.  Recommend dual antiplatelet therapy aspirin Plavix for 3 months and aggressive risk factor modification.  Physical occupational therapy and rehab consults.  Discussed with patient and mother and answered questions.  Discussed with Dr. Roger Shelter.  Greater than 50% time during this 35-minute visit were spent on counseling and coordination of care about her strokes and answering questions Antony Contras, MD To contact Stroke Continuity provider, please refer to [email or web address] After hours, contact General Neurology

## 2019-10-10 NOTE — Evaluation (Signed)
Occupational Therapy Evaluation Patient Details Name: Amy Raymond MRN: [id number] DOB: 02-Mar-1966 Today's Date: 10/10/2019    History of Present Illness  Amy Raymond is a 54 y.o. 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    Clinical Impression   PTA, pt was living with her mother (after recent dc on 2/28) and was performing BADLs. Pt currently requiring Min A for UB ADLs, Min-Max A for LB ADLs, and Min-Mod A for functional mobility. Pt is highly motivated and has good family support. Pt presenting with decreased balance, coordination, cognition, and safety. Pt will require further acute OT to facilitate safe dc. Due to pt's age, motivation, PLOF, and support, recommend dc to CIR for intensive OT to optimize safe return to home and PLOF.     Follow Up Recommendations  CIR    Equipment Recommendations  None recommended by OT    Recommendations for Other Services Rehab consult;PT consult;Speech consult     Precautions / Restrictions Precautions Precautions: Fall Precaution Comments: fell before presenting to ED Restrictions Weight Bearing Restrictions: No      Mobility Bed Mobility Overal bed mobility: Needs Assistance Bed Mobility: Supine to Sit     Supine to sit: Min guard     General bed mobility comments: Min Guard A for safety  Transfers Overall transfer level: Needs assistance Equipment used: None Transfers: Sit to/from Stand Sit to Stand: Min assist         General transfer comment: Min A to gain balance in standing. Min A for power up from toilet    Balance Overall balance assessment: Needs assistance Sitting-balance support: No upper extremity supported;Feet supported Sitting balance-Leahy Scale: Fair     Standing balance support: No upper extremity supported Standing balance-Leahy Scale: Poor Standing balance comment: Reliant on UE support and physical A                   Standardized Balance Assessment Standardized Balance Assessment : Berg Balance Test Berg Balance Test Sit to Stand: Able to stand without using hands and stabilize independently Standing Unsupported: Able to stand safely 2 minutes Sitting with Back Unsupported but Feet Supported on Floor or Stool: Able to sit safely and securely 2 minutes Stand to Sit: Sits safely with minimal use of hands Transfers: Able to transfer safely, minor use of hands Standing Unsupported with Eyes Closed: Able to stand 10 seconds safely Standing Ubsupported with Feet Together: Able to place feet together independently and stand for 1 minute with supervision From Standing, Reach Forward with Outstretched Arm: Can reach confidently >25 cm (10") From Standing Position, Pick up Object from Floor: Able to pick up shoe safely and easily From Standing Position, Turn to Look Behind Over each Shoulder: Looks behind from both sides and weight shifts well Turn 360 Degrees: Able to turn 360 degrees safely in 4 seconds or less Standing Unsupported, Alternately Place Feet on Step/Stool: Able to stand independently and complete 8 steps >20 seconds Standing Unsupported, One Foot in Front: Able to plae foot ahead of the other independently and hold 30 seconds Standing on One Leg: Able to lift leg independently and hold 5-10 seconds Total Score: 52 Dynamic Gait Index Level Surface: Normal Change in Gait Speed: Normal Gait with Horizontal Head Turns: Normal Gait with Vertical Head Turns: Normal Gait and Pivot Turn: Mild Impairment Step Over Obstacle: Normal Step Around Obstacles: Normal Steps: Normal Total Score: 23  ADL either performed or assessed with clinical judgement   ADL Overall ADL's : Needs assistance/impaired Eating/Feeding: Set up;Supervision/ safety;Sitting   Grooming: Wash/dry hands;Min guard;Standing Grooming Details (indicate cue type and reason): Cues for use of RUE as pt with  tendency to only use LUE. Decreased coorindation. Min Guard A for safety and balance Upper Body Bathing: Set up;Supervision/ safety;Sitting   Lower Body Bathing: Minimal assistance;Sit to/from stand   Upper Body Dressing : Minimal assistance;Sitting Upper Body Dressing Details (indicate cue type and reason): Min A for donning new gown like jacket Lower Body Dressing: Maximal assistance;Sit to/from stand Lower Body Dressing Details (indicate cue type and reason): Max A to adjust socks Toilet Transfer: Minimal assistance;Ambulation(simulated to recliner) Armed forces technical officer Details (indicate cue type and reason): Min A for balance and safety Toileting- Clothing Manipulation and Hygiene: Minimal assistance;Sit to/from stand Toileting - Clothing Manipulation Details (indicate cue type and reason): Min A for balance and to manage gown     Functional mobility during ADLs: Rolling walker;Minimal assistance;Moderate assistance(Min A with RW. Mod A without RW) General ADL Comments: Pt presenting with decreased balance, coorindation, and safety     Vision Baseline Vision/History: No visual deficits;Wears glasses Wears Glasses: At all times Patient Visual Report: No change from baseline Vision Assessment?: No apparent visual deficits Eye Alignment: Within Functional Limits Ocular Range of Motion: Within Functional Limits Alignment/Gaze Preference: Within Defined Limits     Perception     Praxis      Pertinent Vitals/Pain Pain Assessment: No/denies pain Faces Pain Scale: No hurt     Hand Dominance Right   Extremity/Trunk Assessment Upper Extremity Assessment Upper Extremity Assessment: RUE deficits/detail RUE Deficits / Details: Decreased grip strength. Poor finger opposition requiring significant time to complete. Able to perform AROM throughout. Limited shoulder ROM (0-80*) with compensatory hiking RUE Sensation: decreased proprioception RUE Coordination: decreased fine motor;decreased  gross motor   Lower Extremity Assessment Lower Extremity Assessment: Defer to PT evaluation RLE Deficits / Details: MMT R=L but on ambulation noted RLE apraxia, especially with descending stairs RLE Sensation: decreased proprioception RLE Coordination: decreased gross motor   Cervical / Trunk Assessment Cervical / Trunk Assessment: Other exceptions Cervical / Trunk Exceptions: Decreased coorindation and strength.    Communication Communication Communication: Expressive difficulties(Slurred speech)   Cognition Arousal/Alertness: Awake/alert Behavior During Therapy: WFL for tasks assessed/performed Overall Cognitive Status: Impaired/Different from baseline Area of Impairment: Following commands;Problem solving                       Following Commands: Follows one step commands with increased time;Follows one step commands consistently     Problem Solving: Requires verbal cues General Comments: Pt highly motivated to participate in therapy. Following simple commands with increased time. Cues for safety to "slow down" as pt moves quickly normally but now has decreased coorindation and balance   General Comments       Exercises Exercises: Other exercises Other Exercises Other Exercises: issued fine motor/coordination HEP and reviewed with pt    Shoulder Instructions      Home Living Family/patient expects to be discharged to:: Private residence Living Arrangements: Alone Available Help at Discharge: Family;Available 24 hours/day Type of Home: House Home Access: Stairs to enter [employer] of Steps: 3   Home Layout: One level     Bathroom Shower/Tub: Teacher, early years/pre: Standard     Home Equipment: None   Additional Comments: Was staying at mother's home prior to this admission.  Lives With: Family(Mother)    Prior Functioning/Environment Level of Independence: Independent        Comments: was drving prior to last CVA, reports  that she has not gotten her full energy back since that CVA, has not really wanted to leave her home despite her sons' urgings        OT Problem List: Impaired UE functional use;Decreased coordination;Decreased strength;Impaired balance (sitting and/or standing);Decreased activity tolerance;Decreased knowledge of precautions      OT Treatment/Interventions: Self-care/ADL training;Neuromuscular education;DME and/or AE instruction;Energy conservation;Therapeutic activities;Patient/family education;Balance training;Therapeutic exercise    OT Goals(Current goals can be found in the care plan section) Acute Rehab OT Goals Patient Stated Goal: home, get energy back OT Goal Formulation: With patient Time For Goal Achievement: 10/19/19 Potential to Achieve Goals: Good  OT Frequency: Min 2X/week   Barriers to D/C:            Co-evaluation              AM-PAC OT "6 Clicks" Daily Activity     Outcome Measure Help from another person eating meals?: None Help from another person taking care of personal grooming?: A Little Help from another person toileting, which includes using toliet, bedpan, or urinal?: A Little Help from another person bathing (including washing, rinsing, drying)?: A Lot Help from another person to put on and taking off regular upper body clothing?: A Little Help from another person to put on and taking off regular lower body clothing?: A Lot 6 Click Score: 17   End of Session Equipment Utilized During Treatment: Gait belt;Rolling walker Nurse Communication: Mobility status  Activity Tolerance: Patient tolerated treatment well Patient left: in chair;with call bell/phone within reach;with chair alarm set;with family/visitor present  OT Visit Diagnosis: Other symptoms and signs involving the nervous system (R29.898);Unsteadiness on feet (R26.81);Other abnormalities of gait and mobility (R26.89);Muscle weakness (generalized) (M62.81)                Time:  1062-6948 OT Time Calculation (min): 24 min Charges:  OT General Charges $OT Visit: 1 Visit OT Evaluation $OT Eval Moderate Complexity: 1 Mod  Sharissa Brierley MSOT, OTR/L Acute Rehab Pager: [phone] Office: [phone]  Theodoro Grist Jacquiline Zurcher 10/10/2019, 1:39 PM

## 2019-10-10 NOTE — Progress Notes (Signed)
Physical Therapy Treatment Patient Details Name: Amy Raymond MRN: [id number] DOB: 1966-04-08 Today's Date: 10/10/2019    History of Present Illness  Amy Raymond is a 54 y.o. 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     PT Comments    Pt was able to walk to the bathroom with min to mod hand held assist for balance, searching for furniture for support with her free hand and impulsively moving quickly despite balance deficits.  She was half out of the recliner chair when PT stepped into her room to assist.  Pt was able to report her need to go to the bathroom and preform peri care with the support of the grab bar and supervision.  She would need 24/7 physical assist at home and would benefit from intensive multi disciplinary rehab at discharge.  PT will continue to follow acutely for safe mobility progression.  Follow Up Recommendations  CIR;Supervision/Assistance - 24 hour     Equipment Recommendations  Rolling walker with 5" wheels    Recommendations for Other Services Rehab consult     Precautions / Restrictions Precautions Precautions: Fall Precaution Comments: fell before presenting to ED Restrictions Weight Bearing Restrictions: No    Mobility  Bed Mobility Overal bed mobility: Needs Assistance       Sit to supine: Supervision   General bed mobility comments: use of railing and momentum for leverage  Transfers Overall transfer level: Needs assistance Equipment used: 1 person hand held assist Transfers: Sit to/from Stand Sit to Stand: Min assist         General transfer comment: Min assist for balance during transitions.   Ambulation/Gait Ambulation/Gait assistance: Mod assist;Min assist Gait Distance (Feet): 15 Feet Assistive device: 1 person hand held assist Gait Pattern/deviations: Ataxic;Staggering right   General Gait Details: Up to mod assist during  gait to and from the bathroom with hand held assist.  Pt reaching with free hand for support from furniture.       Modified Rankin (Stroke Patients Only) Modified Rankin (Stroke Patients Only) Pre-Morbid Rankin Score: Moderate disability Modified Rankin: Moderately severe disability     Balance Overall balance assessment: Needs assistance Sitting-balance support: Feet supported;No upper extremity supported Sitting balance-Leahy Scale: Fair    Standing balance support: Bilateral upper extremity supported;Single extremity supported;No upper extremity supported Standing balance-Leahy Scale: Poor Standing balance comment: needs external support in standing.                             Cognition Arousal/Alertness: Awake/alert Behavior During Therapy: Impulsive Overall Cognitive Status: Impaired/Different from baseline Area of Impairment: Following commands;Problem solving                       Following Commands: Follows one step commands with increased time;Follows one step commands consistently     Problem Solving: Requires verbal cues General Comments: Pt is a bit impulsive seen half out of the chair needing to go to the bathroom when this PT entered the room.          General Comments General comments (skin integrity, edema, etc.): pt able to preform peri care with supervision and one hand supported on rail for balance.       Pertinent Vitals/Pain Pain Assessment: No/denies pain Faces Pain Scale: No hurt    Home Living Family/patient expects to be discharged to:: Private  residence Living Arrangements: Parent Available Help at Discharge: Family;Available 24 hours/day Type of Home: House Home Access: Stairs to enter   Home Layout: One level Home Equipment: None Additional Comments: Was staying at mother's home prior to this admission.    Prior Function Level of Independence: Independent      Comments: she noticed her speech deficits with  this CVA. After first CVA was living alone but not back to full energy level. After 2nd one, went home with her mother.    PT Goals (current goals can now be found in the care plan section) Acute Rehab PT Goals Patient Stated Goal: home, get energy back PT Goal Formulation: With patient Time For Goal Achievement: 10/24/19 Potential to Achieve Goals: Good Progress towards PT goals: Progressing toward goals    Frequency    Min 4X/week      PT Plan Current plan remains appropriate    Co-evaluation PT/OT/SLP Co-Evaluation/Treatment: Yes Reason for Co-Treatment: Complexity of the patient's impairments (multi-system involvement);Necessary to address cognition/behavior during functional activity;For patient/therapist safety PT goals addressed during session: Mobility/safety with mobility;Balance;Proper use of DME        AM-PAC PT "6 Clicks" Mobility   Outcome Measure  Help needed turning from your back to your side while in a flat bed without using bedrails?: None Help needed moving from lying on your back to sitting on the side of a flat bed without using bedrails?: None Help needed moving to and from a bed to a chair (including a wheelchair)?: A Little Help needed standing up from a chair using your arms (e.g., wheelchair or bedside chair)?: A Little Help needed to walk in hospital room?: A Little Help needed climbing 3-5 steps with a railing? : A Little 6 Click Score: 20    End of Session Equipment Utilized During Treatment: Gait belt Activity Tolerance: Patient tolerated treatment well Patient left: in bed;with call bell/phone within reach Nurse Communication: Mobility status PT Visit Diagnosis: Other abnormalities of gait and mobility (R26.89);Ataxic gait (R26.0)     Time: 4166-0630 PT Time Calculation (min) (ACUTE ONLY): 19 min  Charges:  $Therapeutic Activity: 8-22 mins          Corinna Capra, PT, DPT  Acute Rehabilitation [phone] pager #[phone]  office  @ [facility]: [phone]            10/10/2019, 4:59 PM

## 2019-10-10 NOTE — Evaluation (Signed)
Clinical/Bedside Swallow Evaluation Patient Details  Name: Amy Raymond MRN: [id number] Date of Birth: 28-Sep-1965  Today's Date: 10/10/2019 Time: SLP Start Time (ACUTE ONLY): 0850 SLP Stop Time (ACUTE ONLY): 0910 SLP Time Calculation (min) (ACUTE ONLY): 20 min  Past Medical History:  Past Medical History:  Diagnosis Date  . CVA (cerebral vascular accident) ([place])    09/11/19; 10/03/19  . Diabetes mellitus without complication ([place])   . Dyslipidemia   . Hypertension   . Hypothyroidism (acquired)    Past Surgical History:  Past Surgical History:  Procedure Laterality Date  . BUBBLE STUDY  10/04/2019   Procedure: BUBBLE STUDY;  Surgeon: Skeet Latch, MD;  Location: [facility];  Service: Cardiovascular;;  . IR ANGIO INTRA EXTRACRAN SEL INTERNAL CAROTID BILAT MOD SED  10/05/2019  . IR ANGIO VERTEBRAL SEL VERTEBRAL UNI R MOD SED  10/05/2019  . IR US GUIDE VASC ACCESS RIGHT  10/05/2019  . TEE WITHOUT CARDIOVERSION N/A 10/04/2019   Procedure: TRANSESOPHAGEAL ECHOCARDIOGRAM (TEE);  Surgeon: Skeet Latch, MD;  Location: [facility];  Service: Cardiovascular;  Laterality: N/A;  . TUBAL LIGATION     HPI:  Amy Raymond is a 54 y.o. 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 Patient was discharged on 10/03/19 after second CVA in one month. She does have a residue right side weakness. Daughter noticed that on Friday, pt has had episodes of slurred speech and  today she was home and about 1250 her speech became slurred speech, confusion and dizziness, also seemed have some trouble swallowing. MRI reported: "1. Mild enlargement of a pontine infarct since the 10/03/2019 MRI. 2. Numerous new punctate acute infarcts throughout both cerebral and cerebellar hemispheres." Pt completed a BSE on 09/11/19 with recommendations for regular solids and thin liquids.    Assessment / Plan / Recommendation Clinical  Impression  Pt was seen for a bedside swallow evaluation and she presents with oral dysphagia with suspected pharyngeal dysphagia.  Pt was observed to have mildly decreased secretion management upon SLP arrival with intermittent tongue pumping.  She denied difficulty swallow prior to this admission.  She consumed trials of ice chips, thin liquid, nectar-thick liquid, puree, and regular solids.  AP transport was prolonged with all trials and intermittent tongue pumping with anterior propulsion past her lips was observed.  Immediate throat clearing and/or coughing was observed with all thin liquid trials.  Mastication was moderately prolonged with regular solid trials, and an immediate throat clear was observed following swallow initiation.  No overt s/sx of aspiration were observed with nectar-thick liquid or puree trials.  Pt would benefit from an instrumental swallow study to further evaluate swallow function.  Recommend initiation of Dysphagia 1 (puree) solids and nectar-thick liquids with medications administered crushed in puree.  SLP will f/u per POC.   SLP Visit Diagnosis: Dysphagia, unspecified (R13.10)    Aspiration Risk  Mild aspiration risk    Diet Recommendation Dysphagia 1 (Puree);Nectar-thick liquid   Liquid Administration via: Cup;Straw Medication Administration: Crushed with puree Supervision: Patient able to self feed Compensations: Slow rate;Small sips/bites Postural Changes: Seated upright at 90 degrees    Other  Recommendations Oral Care Recommendations: Oral care BID Other Recommendations: Have oral suction available;Order thickener from pharmacy   Follow up Recommendations Home health SLP;Outpatient SLP      Frequency and Duration min 2x/week  2 weeks       Prognosis Prognosis for Safe Diet Advancement: Good  Barriers to Reach Goals: Cognitive deficits;Language deficits      Swallow Study   General Date of Onset: 10/09/19 HPI: Amy Raymond is a 54 y.o. 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 Patient was discharged on 10/03/19 after second CVA in one month. She does have a residue right side weakness. Daughter noticed that on Friday, pt has had episodes of slurred speech and  today she was home and about 1250 her speech became slurred speech, confusion and dizzyness, also seemed have some trouble swallowing. MRI reported: "1. Mild enlargement of a pontine infarct since the 10/03/2019 MRI. 2. Numerous new punctate acute infarcts throughout both cerebral and cerebellar hemispheres." Pt completed a BSE on 09/11/19 with recommendations for regular solids and thin liquids.  Type of Study: Bedside Swallow Evaluation Previous Swallow Assessment: See HPI  Diet Prior to this Study: NPO Temperature Spikes Noted: No Respiratory Status: Room air History of Recent Intubation: No Behavior/Cognition: Alert;Cooperative;Pleasant mood Oral Cavity Assessment: Within Functional Limits Oral Care Completed by SLP: No Oral Cavity - Dentition: Missing dentition Vision: Functional for self-feeding Self-Feeding Abilities: Able to feed self Patient Positioning: Upright in bed Baseline Vocal Quality: Normal Volitional Cough: Weak Volitional Swallow: Able to elicit    Oral/Motor/Sensory Function Overall Oral Motor/Sensory Function: Mild impairment Facial ROM: Reduced right;Reduced left Facial Symmetry: Abnormal symmetry right Lingual ROM: Within Functional Limits Lingual Symmetry: Abnormal symmetry right   Ice Chips Ice chips: Impaired Presentation: Spoon Oral Phase Functional Implications: Prolonged oral transit Pharyngeal Phase Impairments: Throat Clearing - Immediate   Thin Liquid Thin Liquid: Impaired Presentation: Spoon;Straw;Cup Pharyngeal  Phase Impairments: Throat Clearing - Immediate;Cough - Immediate    Nectar Thick Nectar Thick Liquid: Within functional  limits Presentation: Cup;Straw   Honey Thick Honey Thick Liquid: Not tested   Puree Puree: Impaired Presentation: Spoon Oral Phase Functional Implications: Prolonged oral transit   Solid     Solid: Impaired Presentation: Self Fed Oral Phase Impairments: Impaired mastication Oral Phase Functional Implications: Impaired mastication;Prolonged oral transit Pharyngeal Phase Impairments: Throat Clearing - Immediate     Villa Herb M.S., CCC-SLP Acute Rehabilitation Services Office: [phone]  Shanon Rosser Zaccai Chavarin 10/10/2019,10:00 AM

## 2019-10-10 NOTE — Progress Notes (Signed)
1        PROGRESS NOTE    Patient: Amy Raymond                            PCP: [place], Julie M, NP                    DOB: 02-Mar-1966            DOA: 10/09/2019 HDQ:[id number]             DOS: 10/10/2019, 11:33 AM   LOS: 1 day   Date of Service: The patient was seen and examined on 10/10/2019  Subjective:   The patient was seen and examined this morning, asymmetric facial droop not clear from previous stroke at this point speech mildly slurred, patient stating no new weakness in upper or lower or lower extremity. Nursing staff present at bedside along with speech therapy..   Brief Narrative:   Amy Raymond is a 54 y.o. 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 Patient was discharged on 10/03/19 after second CVA in one month. She does have a residue right side weakness. Daughter noticed that on Friday, pt has had episodes of slurred speech and  today she was home and about 1250 her speech became slurred speech, confusion and dizzyness, also seemed have some trouble swallowing. Yesterday, symptoms worsened, for daughter noticed her mouth was foamy all day and continue to complains about blurry vision and trouble to speak.  ED Course: MRI showed multiple new strokes  10/10/2019: Patient was seen and examined, laying in bed comfortably, mildly slurred speech, asymmetric facial droop, strength intact in upper lower extremity.  Awake alert no complaints.   Assessment & Plan:   Active Problems:   Acute CVA (cerebrovascular accident) (HCC)   Embolic stroke (HCC)    Recurrent embolic Stroke -Mild slurred speech, asymmetric facial droop, Strength mild asymmetry, but overall intact in upper lower extremity, -MRI of the head was reviewed, multiple new foci of a stroke was noted, likely embolic -Neurology following -Patient already on aspirin Plavix, likely would need chronic anticoagulation -awaiting  neurology further recommendation regarding anticoagulation - no direct evidence of a-fib (Supposed to get outpt Holter hooked up) Continue ASA and Plavix Speech evaluation PT OT Allow permissive HTN for 2-3 days PRN BP meds only TTE and TEE was done within the week, no PFO or atrium thromboses detected.  Aphagia with questionable dysphagia 2nd to stroke NPO Aspiration precautions Speech evaluation  Borderline hypokalemia Given we are suspecting a-fib, will replace and recheck, check Mg too.  IIDM Insulin for tighter glucose control for now.  HLD High dose statin  DVT prophylaxis: Heparin SubQ Code Status: Full Family Communication: Daughter at bedside Disposition Plan: Home once more stable Consults called:Neuro Admission status: Tele admission    Nutritional status:            Procedures:   No admission procedures for hospital encounter.    Antimicrobials:  Anti-infectives (From admission, onward)   None       Medication:  . aspirin  325 mg Oral Daily  . atorvastatin  80 mg Oral q1800  . cholecalciferol  1,000 Units Oral Daily  . clopidogrel  75 mg Oral Daily  . ferrous sulfate  325 mg Oral Q breakfast  . heparin  5,000 Units Subcutaneous Q8H  . insulin aspart  0-9 Units Subcutaneous TID WC  . levothyroxine  100 mcg Oral QAC breakfast  . sodium chloride flush  3 mL Intravenous Once    acetaminophen **OR** acetaminophen (TYLENOL) oral liquid 160 mg/5 mL **OR** acetaminophen, labetalol, Resource ThickenUp Clear, senna-docusate, white petrolatum   Objective:   Vitals:   10/09/19 2320 10/10/19 0126 10/10/19 0401 10/10/19 0758  BP: 134/63  (!) 158/62 (!) 157/66  Pulse: 66 77 69 79  Resp: 20  18 18   Temp: 98.5 F (36.9 C)  98.4 F (36.9 C) 99.1 F (37.3 C)  TempSrc: Oral  Oral Oral  SpO2: 98% 100% 100% 99%  Weight:      Height:        Intake/Output Summary (Last 24 hours) at 10/10/2019 1133 Last data filed at 10/10/2019 0800 Gross  per 24 hour  Intake 964.62 ml  Output 350 ml  Net 614.62 ml   Filed Weights   10/09/19 1651  Weight: 68.4 kg     Examination:   Physical Exam  Constitution:  Alert, cooperative, no distress,  Appears calm and comfortable  Asymmetric facial droop noted, mild slurred speech, cognition intact Psychiatric: Normal and stable mood and affect, cognition intact,   HEENT: Normocephalic, PERRL, otherwise with in Normal limits  Chest:Chest symmetric Cardio vascular:  S1/S2, RRR, No murmure, No Rubs or Gallops  pulmonary: Clear to auscultation bilaterally, respirations unlabored, negative wheezes / crackles Abdomen: Soft, non-tender, non-distended, bowel sounds,no masses, no organomegaly Muscular skeletal: Limited exam - in bed, able to move all 4 extremities, Normal strength,  Neuro: Left facial droop, CNII-XII intact.  Mild slurred speech, cognition intact, normal sensation, motor reduced right upper and lower extremity 4 out of 5 left side strength intact normal motor and sensation, reflexes intact positive for tongue deviation Extremities: No pitting edema lower extremities, +2 pulses  Skin: Dry, warm to touch, negative for any Rashes, No open wounds Wounds: None visible.     LABs:  CBC Latest Ref Rng & Units 10/09/2019 10/09/2019 10/04/2019  WBC 4.0 - 10.5 K/uL 11.0(H) - -  Hemoglobin 12.0 - 15.0 g/dL 10.2(L) 10.9(L) -  Hematocrit 36.0 - 46.0 % 32.9(L) 32.0(L) 30.2(L)  Platelets 150 - 400 K/uL 582(H) - -   CMP Latest Ref Rng & Units 10/10/2019 10/09/2019 10/09/2019  Glucose 70 - 99 mg/dL 105(H) 99 101(H)  BUN 6 - 20 mg/dL 9 11 10   Creatinine 0.44 - 1.00 mg/dL 0.62 0.75 0.50  Sodium 135 - 145 mmol/L 143 144 144  Potassium 3.5 - 5.1 mmol/L 3.4(L) 3.5 3.4(L)  Chloride 98 - 111 mmol/L 109 108 108  CO2 22 - 32 mmol/L 21(L) 21(L) -  Calcium 8.9 - 10.3 mg/dL 9.0 9.5 -  Total Protein 6.5 - 8.1 g/dL - 7.4 -  Total Bilirubin 0.3 - 1.2 mg/dL - 1.0 -  Alkaline Phos 38 - 126 U/L - 131(H) -  AST  15 - 41 U/L - 30 -  ALT 0 - 44 U/L - 29 -        SIGNED: Deatra James, MD, FACP, FHM. [facility],    If 7PM-7AM, please contact night-coverage [email or web address] 10/10/2019, 11:33 AM

## 2019-10-11 ENCOUNTER — Inpatient Hospital Stay (HOSPITAL_COMMUNITY): Payer: BLUE CROSS/BLUE SHIELD

## 2019-10-11 DIAGNOSIS — I6319 Cerebral infarction due to embolism of other precerebral artery: Secondary | ICD-10-CM

## 2019-10-11 LAB — BASIC METABOLIC PANEL
Anion gap: 11 (ref 5–15)
BUN: 7 mg/dL (ref 6–20)
CO2: 23 mmol/L (ref 22–32)
Calcium: 9.2 mg/dL (ref 8.9–10.3)
Chloride: 107 mmol/L (ref 98–111)
Creatinine, Ser: 0.61 mg/dL (ref 0.44–1.00)
GFR calc Af Amer: >60 mL/min (ref >60)
GFR calc non Af Amer: >60 mL/min (ref >60)
Glucose, Bld: 207 mg/dL — ABNORMAL HIGH (ref 70–99)
Potassium: 3.2 mmol/L — ABNORMAL LOW (ref 3.5–5.1)
Sodium: 141 mmol/L (ref 135–145)

## 2019-10-11 LAB — GLUCOSE, CAPILLARY
Glucose-Capillary: 140 mg/dL — ABNORMAL HIGH (ref 70–99)
Glucose-Capillary: 128 mg/dL — ABNORMAL HIGH (ref 70–99)
Glucose-Capillary: 193 mg/dL — ABNORMAL HIGH (ref 70–99)
Glucose-Capillary: 119 mg/dL — ABNORMAL HIGH (ref 70–99)

## 2019-10-11 LAB — CBC WITH DIFFERENTIAL/PLATELET
Abs Immature Granulocytes: 0.04 10*3/uL (ref 0.00–0.07)
Basophils Absolute: 0 10*3/uL (ref 0.0–0.1)
Basophils Relative: 0 %
Eosinophils Absolute: 0.2 10*3/uL (ref 0.0–0.5)
Eosinophils Relative: 2 %
HCT: 29.6 % — ABNORMAL LOW (ref 36.0–46.0)
Hemoglobin: 9.3 g/dL — ABNORMAL LOW (ref 12.0–15.0)
Immature Granulocytes: 0 %
Lymphocytes Relative: 25 %
Lymphs Abs: 2.3 10*3/uL (ref 0.7–4.0)
MCH: 23.4 pg — ABNORMAL LOW (ref 26.0–34.0)
MCHC: 31.4 g/dL (ref 30.0–36.0)
MCV: 74.4 fL — ABNORMAL LOW (ref 80.0–100.0)
Monocytes Absolute: 0.9 10*3/uL (ref 0.1–1.0)
Monocytes Relative: 10 %
Neutro Abs: 5.9 10*3/uL (ref 1.7–7.7)
Neutrophils Relative %: 63 %
Platelets: 533 10*3/uL — ABNORMAL HIGH (ref 150–400)
RBC: 3.98 MIL/uL (ref 3.87–5.11)
RDW: 13.6 % (ref 11.5–15.5)
WBC: 9.4 10*3/uL (ref 4.0–10.5)
nRBC: 0 % (ref 0.0–0.2)

## 2019-10-11 NOTE — Progress Notes (Signed)
Inpatient Rehabilitation-Admissions Coordinator   CIR consult received. Met with pt bedside. Pt's insurance company is not in network with [employer]. We discussed the benefits of an IP Rehab program and pt is interested in a program that in in-network due to cost. [facility] has communicated preference to TOC. AC will sign off.   Raechel Ache, OTR/L  Rehab Admissions Coordinator  [phone] 10/11/2019 3:40 PM

## 2019-10-11 NOTE — Progress Notes (Signed)
STROKE TEAM PROGRESS NOTE   INTERVAL HISTORY Patient states that her slurred speech is improving but not back to her baseline.  She still has mild weakness.  She has been seen by therapy who recommended inpatient rehab and she has been assessed by rehab team. Vitals:   10/10/19 2305 10/11/19 0337 10/11/19 0827 10/11/19 1130  BP: (!) 142/62 (!) 141/66 (!) 155/66 (!) 164/71  Pulse: 69 72 71 77  Resp: 16 18 18 18   Temp: 98.3 F (36.8 C) 98.1 F (36.7 C) 98.6 F (37 C) 98.3 F (36.8 C)  TempSrc: Oral Oral Oral Oral  SpO2: 100% 100% 100% 100%  Weight:      Height:        CBC:  Recent Labs  Lab 10/09/19 1633 10/11/19 0902  WBC 11.0* 9.4  NEUTROABS 7.7 5.9  HGB 10.2* 9.3*  HCT 32.9* 29.6*  MCV 76.3* 74.4*  PLT 582* 533*    Basic Metabolic Panel:  Recent Labs  Lab 10/06/19 0144 10/09/19 1337 10/09/19 1357 10/10/19 0500  NA 142   < > 144 143  K 3.7   < > 3.5 3.4*  CL 107   < > 108 109  CO2 24  --  21* 21*  GLUCOSE 98   < > 99 105*  BUN <5*   < > 11 9  CREATININE 0.69   < > 0.75 0.62  CALCIUM 8.9  --  9.5 9.0  MG 2.0  --   --  1.8   < > = values in this interval not displayed.   Lipid Panel:     Component Value Date/Time   CHOL 172 09/12/2019 0211   TRIG 168 (H) 09/12/2019 0211   HDL 21 (L) 09/12/2019 0211   CHOLHDL 8.2 09/12/2019 0211   VLDL 34 09/12/2019 0211   LDLCALC 117 (H) 09/12/2019 0211   HgbA1c:  Lab Results  Component Value Date   HGBA1C 8.7 (H) 09/11/2019   Urine Drug Screen:     Component Value Date/Time   LABOPIA NONE DETECTED 10/03/2019 0814   COCAINSCRNUR NONE DETECTED 10/03/2019 0814   LABBENZ NONE DETECTED 10/03/2019 0814   AMPHETMU NONE DETECTED 10/03/2019 0814   THCU NONE DETECTED 10/03/2019 0814   LABBARB NONE DETECTED 10/03/2019 0814    Alcohol Level     Component Value Date/Time   ETH <10 10/03/2019 0445    IMAGING past 48 hours MR ANGIO HEAD WO CONTRAST  Result Date: 10/09/2019 CLINICAL DATA:  Worsening slurred speech.  Recent pontine infarct. EXAM: MRI HEAD WITHOUT CONTRAST MRA HEAD WITHOUT CONTRAST TECHNIQUE: Multiplanar, multiecho pulse sequences of the brain and surrounding structures were obtained without intravenous contrast. Angiographic images of the head were obtained using MRA technique without contrast. COMPARISON:  Head CT 10/09/2019, MRI 10/03/2019, and CTA 10/03/2019. Cerebral angiogram 10/05/2019. FINDINGS: MRI HEAD FINDINGS An axial T1 sequence was not performed. Brain: An acute infarct involving the central and left paracentral pons has mildly enlarged from the prior MRI. There are new single punctate acute infarcts in each cerebellar hemisphere, and there are numerous new punctate acute infarcts scattered throughout both cerebral hemispheres involving cortex and white matter of multiple different vascular distributions as well as involving the left thalamus. There is an unchanged single chronic microhemorrhage in the right temporal lobe. No mass, midline shift, or extra-axial fluid collection is identified. A background of mild chronic small vessel ischemia is again noted in the cerebral white matter. The ventricles and sulci are within normal limits for  age. Vascular: Unchanged abnormal appearance of the distal left vertebral artery. Skull and upper cervical spine: Unremarkable bone marrow signal. Sinuses/Orbits: Unremarkable orbits. Paranasal sinuses and mastoid air cells are clear. Other: None. MRA HEAD FINDINGS The visualized distal right vertebral artery is widely patent and supplies the basilar. The proximal left V4 segment remains occluded. There is retrograde opacification of the distal left V4 segment supplying the left PICA. Patent AICAs and SCAs are seen bilaterally. The basilar artery is patent with a mild stenosis of its midportion as previously seen. There is a moderate-sized right posterior communicating artery. Both PCAs are patent without evidence of significant proximal stenosis. The internal  carotid arteries are patent from skull base to carotid termini with similar appearance of bilateral ICA stenoses near the posterior genu, moderate to severe on the left and mild-to-moderate on the right. An infundibulum is noted at the left posterior communicating origin. ACAs and MCAs are patent without evidence of proximal branch occlusion or significant proximal stenosis. No aneurysm is identified. IMPRESSION: 1. Mild enlargement of a pontine infarct since the 10/03/2019 MRI. 2. Numerous new punctate acute infarcts throughout both cerebral and cerebellar hemispheres. 3. Unchanged intracranial atherosclerosis including mild to moderate right and moderate to severe left ICA stenoses and a mild mid basilar artery stenosis. 4. 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 Electronically Signed   By: Sebastian Ache M.D.   On: 10/09/2019 15:21   MR BRAIN WO CONTRAST  Result Date: 10/09/2019 CLINICAL DATA:  Worsening slurred speech. Recent pontine infarct. EXAM: MRI HEAD WITHOUT CONTRAST MRA HEAD WITHOUT CONTRAST TECHNIQUE: Multiplanar, multiecho pulse sequences of the brain and surrounding structures were obtained without intravenous contrast. Angiographic images of the head were obtained using MRA technique without contrast. COMPARISON:  Head CT 10/09/2019, MRI 10/03/2019, and CTA 10/03/2019. Cerebral angiogram 10/05/2019. FINDINGS: MRI HEAD FINDINGS An axial T1 sequence was not performed. Brain: An acute infarct involving the central and left paracentral pons has mildly enlarged from the prior MRI. There are new single punctate acute infarcts in each cerebellar hemisphere, and there are numerous new punctate acute infarcts scattered throughout both cerebral hemispheres involving cortex and white matter of multiple different vascular distributions as well as involving the left thalamus. There is an unchanged single chronic microhemorrhage in the right temporal lobe. No mass, midline shift, or extra-axial  fluid collection is identified. A background of mild chronic small vessel ischemia is again noted in the cerebral white matter. The ventricles and sulci are within normal limits for age. Vascular: Unchanged abnormal appearance of the distal left vertebral artery. Skull and upper cervical spine: Unremarkable bone marrow signal. Sinuses/Orbits: Unremarkable orbits. Paranasal sinuses and mastoid air cells are clear. Other: None. MRA HEAD FINDINGS The visualized distal right vertebral artery is widely patent and supplies the basilar. The proximal left V4 segment remains occluded. There is retrograde opacification of the distal left V4 segment supplying the left PICA. Patent AICAs and SCAs are seen bilaterally. The basilar artery is patent with a mild stenosis of its midportion as previously seen. There is a moderate-sized right posterior communicating artery. Both PCAs are patent without evidence of significant proximal stenosis. The internal carotid arteries are patent from skull base to carotid termini with similar appearance of bilateral ICA stenoses near the posterior genu, moderate to severe on the left and mild-to-moderate on the right. An infundibulum is noted at the left posterior communicating origin. ACAs and MCAs are patent without evidence of proximal branch occlusion or significant proximal stenosis.  No aneurysm is identified. IMPRESSION: 1. Mild enlargement of a pontine infarct since the 10/03/2019 MRI. 2. Numerous new punctate acute infarcts throughout both cerebral and cerebellar hemispheres. 3. Unchanged intracranial atherosclerosis including mild to moderate right and moderate to severe left ICA stenoses and a mild mid basilar artery stenosis. 4. 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 Electronically Signed   By: Logan Bores M.D.   On: 10/09/2019 15:21   CT HEAD CODE STROKE WO CONTRAST  Result Date: 10/09/2019 CLINICAL DATA:  Code stroke.  Slurred speech.  Recent stroke. EXAM: CT  HEAD WITHOUT CONTRAST TECHNIQUE: Contiguous axial images were obtained from the base of the skull through the vertex without intravenous contrast. COMPARISON:  MRI head 10/03/2019 FINDINGS: Brain: Hypodensity in the central and ventral pons similar to the prior MRI compatible with subacute infarct. 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 Negative for new  infarct.  Negative for acute hemorrhage or mass. Vascular: Negative for hyperdense vessel Skull: Negative Sinuses/Orbits: Negative Other: None ASPECTS (Oliver Stroke Program Early CT Score) - Ganglionic level infarction (caudate, lentiform nuclei, internal capsule, insula, M1-M3 cortex): 7 - Supraganglionic infarction (M4-M6 cortex): 3 Total score (0-10 with 10 being normal): 10 IMPRESSION: 1. Recent infarct in the central pons unchanged. No new area of infarct or hemorrhage. 2. ASPECTS is 10 3. These results were called by telephone at the time of interpretation on 10/09/2019 at 1:47 pm to provider Rory Percy , who verbally acknowledged these results. Electronically Signed   By: Franchot Gallo M.D.   On: 10/09/2019 13:48    PHYSICAL EXAM Pleasant middle-aged African-American lady not in distress. . Afebrile. Head is nontraumatic. Neck is supple without bruit.    Cardiac exam no murmur or gallop. Lungs are clear to auscultation. Distal pulses are well felt. Neurological Exam : She is awake alert oriented to time place and person.  Diminished attention, registration and recall.  Speech is dysarthric with pseudobulbar quality.  Right lower facial weakness.  Tongue midline.  Motor system exam reveals no upper or lower extremity drift but mild weakness of right grip and intrinsic hand muscles.  Orbits left over right upper extremity.  Diminished foot tapping on the right.  Deep tendon reflexes are brisk bilaterally.  Sensation is intact.  Plantars are downgoing.  Gait not tested.   ASSESSMENT/PLAN Ms. EFFIE WAHLERT is a 54 y.o. female  with history of stroke last week, HTN, HLD, DM presenting with slurred speech.   Stroke:  New punctate B cerebral and cerebellar infarcts in setting of central pontine infarct last week. New Infarcts embolic secondary to unknown source cryptogenic versus intracranial atherosclerosis pontine infarct felt to be secondary to small vessel disease when angio showed mild BA stenosis .   Code Stroke CT head recent central pontine infarct. ASPECTS 10.     MRI  Evolvement of central pontine infarct since 10/03/19. Numerous new punctate B cerebral and cerebellar infarcts.   MRA  Unchanged:  moderate to moderate R ICA stenosis, moderate to severe L ICA stenosis, mild mid BA stenosis, L VA occlusion   Heparin 5000 units sq tid for VTE prophylaxis  aspirin 325 mg daily and clopidogrel 75 mg daily prior to admission, now on aspirin 325 mg daily and clopidogrel 75 mg daily. Continue x 3 months then ASPIRIN alone   Therapy recommendations:  CIR  Disposition:  pending   Hx stroke/TIA  10/03/2019 - pontine infarct in setting of moderate to severe BA stenosis, infarct secondary  to small vessel disease source. Cerebral angiogram mild BA stenosis w/ flow. L paraclinoid 54% stenosis.  09/11/2019 - episode of aphasia, vision loss and loss of consciousness.  EEG negative.  MRI tiny punctate foci of diffusion abnormality involving the parieto-occipital regions bilaterally as well as chronic left thalamic lacunar infarct. Unable to get TCD window.  CT head and neck hypoplastic left VA, moderate stenosis of mid BA, multifocal atherosclerosis bilateral carotid siphons. DVT negative.  EF 60 to 65%.  LDL 117 and A1c 8.7.  UDS negative.  Put on aspirin and plavix for 3 months given intracranial atherosclerosis.  On Lipitor 40. Recommend 30-day CardioNet monitoring as outpatient.  Hypertension  Stable . Permissive hypertension (OK if < 220/120) but gradually normalize in 5-7 days . Long-term BP goal  normotensive  Hyperlipidemia  Home meds:  Lipitor 80, resumed in hospital  LDL 117, goal < 70  Continue statin at discharge  Diabetes type II Uncontrolled  HgbA1c 8.7, goal < 7.0  Other Stroke Risk Factors  Migraines   Other Active Problems  Hypothyroidism   Hospital day # 2   Recommend dual antiplatelet therapy aspirin Plavix for 3 months and aggressive risk factor modification.  Transfer to inpatient rehab when bed available.  Follow-up as an outpatient stroke clinic in 6 weeks.  Stroke team will sign off.  Kindly call for questions.  Delia Heady, MD To contact Stroke Continuity provider, please refer to [email or web address]. After hours, contact General Neurology

## 2019-10-11 NOTE — Consult Note (Signed)
Physical Medicine and Rehabilitation Consult Reason for Consult: Slurred speech and decreased functional mobility Referring Physician: [facility]   HPI: Amy Raymond is a 54 y.o. right-handed female with history of hypertension, hyperlipidemia, CVA 08/2019, 09/2019 maintained on aspirin and Plavix.  Per chart review patient lives with his elderly mother.  Reportedly independent prior to admission.  1 level home 3 steps to enter.  Presented 10/09/2019 with slurred speech and facial symmetry as well as some increased right side weakness.  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  Admission chemistries unremarkable, hemoglobin 10.2, WBC 11, SARS coronavirus negative.  Follow-up CT/MRI imaging showed mild enlargement of pontine infarct since 10/03/2019.  Numerous new punctate acute infarcts throughout both cerebral and cerebellar hemispheres.  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  Recent echocardiogram with ejection fraction of 65%.  Neurology follow-up currently maintained on aspirin 325 mg daily as well as Plavix 75 mg daily.  Subcutaneous heparin for DVT prophylaxis.  Dysphagia #1 nectar thick liquid diet.  Therapy evaluations completed with recommendations of physical medicine rehab consult.   Review of Systems  Constitutional: Negative for chills and fever.  HENT: Negative for hearing loss.   Eyes: Negative for blurred vision and double vision.  Respiratory: Negative for cough and shortness of breath.   Cardiovascular: Negative for chest pain and palpitations.  Gastrointestinal: Positive for constipation. Negative for heartburn, nausea and vomiting.  Genitourinary: Negative for dysuria and hematuria.  Musculoskeletal: Positive for joint pain and myalgias.  Skin: Negative for rash.  Neurological: Positive for speech change and weakness.  All other systems reviewed  and are negative.  Past Medical History:  Diagnosis Date  . CVA (cerebral vascular accident) (HCC)    09/11/19; 10/03/19  . Diabetes mellitus without complication (HCC)   . Dyslipidemia   . Hypertension   . Hypothyroidism (acquired)    Past Surgical History:  Procedure Laterality Date  . BUBBLE STUDY  10/04/2019   Procedure: BUBBLE STUDY;  Surgeon: Chilton Si, MD;  Location: [facility] ENDOSCOPY;  Service: Cardiovascular;;  . IR ANGIO INTRA EXTRACRAN SEL INTERNAL CAROTID BILAT MOD SED  10/05/2019  . IR ANGIO VERTEBRAL SEL VERTEBRAL UNI R MOD SED  10/05/2019  . IR US GUIDE VASC ACCESS RIGHT  10/05/2019  . TEE WITHOUT CARDIOVERSION N/A 10/04/2019   Procedure: TRANSESOPHAGEAL ECHOCARDIOGRAM (TEE);  Surgeon: Chilton Si, MD;  Location: [facility] ENDOSCOPY;  Service: Cardiovascular;  Laterality: N/A;  . TUBAL LIGATION     Family History  Problem Relation Age of Onset  . Asthma Mother   . Thrombocytopenia Mother   . Dementia Father   . Stroke Neg Hx    Social History:  reports that she has never smoked. She has never used smokeless tobacco. She reports that she does not drink alcohol or use drugs. Allergies: No Known Allergies Medications Prior to Admission  Medication Sig Dispense Refill  . acetaminophen (TYLENOL) 500 MG tablet Take 500-1,000 mg by mouth every 6 (six) hours as needed for mild pain or headache.    Marland Kitchen aspirin EC 325 MG EC tablet Take 1 tablet (325 mg total) by mouth daily. 30 tablet 2  . atorvastatin (LIPITOR) 40 MG tablet Take 40 mg by mouth daily.    . cholecalciferol (VITAMIN D3) 25 MCG (1000 UNIT) tablet Take 1,000 Units by mouth daily.    . clopidogrel (PLAVIX) 75 MG tablet Take 1 tablet (  75 mg total) by mouth daily. 30 tablet 3  . glipiZIDE (GLUCOTROL) 10 MG tablet Take 10 mg by mouth 2 (two) times daily.    Marland Kitchen levothyroxine (SYNTHROID) 175 MCG tablet Take 175 mcg by mouth daily before breakfast.    . Linagliptin-Metformin HCl (JENTADUETO) 2.12-998 MG TABS Take 1 tablet  by mouth 2 (two) times daily.      . ondansetron (ZOFRAN) 4 MG tablet Take 1 tablet (4 mg total) by mouth every 6 (six) hours as needed for nausea. 20 tablet 0  . atorvastatin (LIPITOR) 80 MG tablet Take 1 tablet (80 mg total) by mouth daily at 6 PM. 30 tablet 2  . ferrous sulfate 325 (65 FE) MG tablet Take 325 mg by mouth daily with breakfast.    . levothyroxine (SYNTHROID) 100 MCG tablet Take 1 tablet (100 mcg total) by mouth daily before breakfast. 30 tablet 2  . lisinopril (ZESTRIL) 10 MG tablet Take 10 mg by mouth daily.      Home: Home Living Family/patient expects to be discharged to:: Private residence Living Arrangements: Parent Available Help at Discharge: Family, Available 24 hours/day Type of Home: House Home Access: Stairs to enter [employer] of Steps: 3 Home Layout: One level Bathroom Shower/Tub: Engineer, manufacturing systems: Standard Home Equipment: None Additional Comments: Was staying at [employer] home prior to this admission.  Lives With: Family(Mother)  Functional History: Prior Function Level of Independence: Independent Comments: she noticed her speech deficits with this CVA. After first CVA was living alone but not back to full energy level. After 2nd one, went home with her mother.  Functional Status:  Mobility: Bed Mobility Overal bed mobility: Needs Assistance Bed Mobility: Sit to Supine Supine to sit: Min guard Sit to supine: Supervision General bed mobility comments: use of railing and momentum for leverage Transfers Overall transfer level: Needs assistance Equipment used: 1 person hand held assist Transfers: Sit to/from Stand Sit to Stand: Min assist General transfer comment: Min assist for balance during transitions.  Ambulation/Gait Ambulation/Gait assistance: Mod assist, Min assist Gait Distance (Feet): 15 Feet Assistive device: 1 person hand held assist Gait Pattern/deviations: Ataxic, Staggering right General Gait Details: Up  to mod assist during gait to and from the bathroom with hand held assist.  Pt reaching with free hand for support from furniture.  Gait velocity: decreased Gait velocity interpretation: 1.31 - 2.62 ft/sec, indicative of limited community ambulator Stairs assistance: Min guard Stair Management: Two rails, Forwards, Step to pattern General stair comments: Cues for sequencing to regress to step to pattern due to R quad weakness.    ADL: ADL Overall ADL's : Needs assistance/impaired Eating/Feeding: Set up, Supervision/ safety, Sitting Grooming: Wash/dry hands, Min guard, Standing Grooming Details (indicate cue type and reason): Cues for use of RUE as pt with tendency to only use LUE. Decreased coorindation. Min Guard A for safety and balance Upper Body Bathing: Set up, Supervision/ safety, Sitting Lower Body Bathing: Minimal assistance, Sit to/from stand Upper Body Dressing : Minimal assistance, Sitting Upper Body Dressing Details (indicate cue type and reason): Min A for donning new gown like jacket Lower Body Dressing: Maximal assistance, Sit to/from stand Lower Body Dressing Details (indicate cue type and reason): Max A to adjust socks Toilet Transfer: Minimal assistance, Ambulation(simulated to recliner) Toilet Transfer Details (indicate cue type and reason): Min A for balance and safety Toileting- Clothing Manipulation and Hygiene: Minimal assistance, Sit to/from stand Toileting - Clothing Manipulation Details (indicate cue type and reason): Min A for  balance and to manage gown Functional mobility during ADLs: Rolling walker, Minimal assistance, Moderate assistance(Min A with RW. Mod A without RW) General ADL Comments: Pt presenting with decreased balance, coorindation, and safety  Cognition: Cognition Overall Cognitive Status: Impaired/Different from baseline Arousal/Alertness: Awake/alert Orientation Level: Oriented X4 Attention: Sustained, Focused Focused Attention: Appears  intact Sustained Attention: Appears intact Memory: Appears intact Immediate Memory Recall: Sheila Oats, Bed Memory Recall Sock: Without Cue Memory Recall Blue: Without Cue Memory Recall Bed: Without Cue Awareness: Appears intact Problem Solving: Impaired Problem Solving Impairment: Verbal complex Safety/Judgment: Appears intact Cognition Arousal/Alertness: Awake/alert Behavior During Therapy: Impulsive Overall Cognitive Status: Impaired/Different from baseline Area of Impairment: Following commands, Problem solving Following Commands: Follows one step commands with increased time, Follows one step commands consistently Problem Solving: Requires verbal cues General Comments: Pt is a bit impulsive seen half out of the chair needing to go to the bathroom when this PT entered the room.   Blood pressure (!) 141/66, pulse 72, temperature 98.1 F (36.7 C), temperature source Oral, resp. rate 18, height 5\' 4"  (1.626 m), weight 68.4 kg, SpO2 100 %.   Physical Exam   General: Alert and oriented x 3, No apparent distress HEENT: Head is normocephalic, atraumatic, PERRLA, EOMI, sclera anicteric, oral mucosa pink and moist, dentition intact, ext ear canals clear,  Neck: Supple without JVD or lymphadenopathy Heart: Reg rate and rhythm. No murmurs rubs or gallops Chest: CTA bilaterally without wheezes, rales, or rhonchi; no distress Abdomen: Soft, non-tender, non-distended, bowel sounds positive. Extremities: No clubbing, cyanosis, or edema. Pulses are 2+ Skin: Clean and intact without signs of breakdown Neurological: Patient is alert anxious some decrease in attention as well as tearful.  Oriented to person place and age.  Follows simple commands. Impaired recall. Dysarthric speech. 5/5 strength on left side, 4/5 on right side.   Psych: Pt's affect is appropriate. Pt is cooperative   Results for orders placed or performed during the hospital encounter of 10/09/19 (from the past 24 hour(s))    Glucose, capillary     Status: None   Collection Time: 10/10/19  7:48 AM  Result Value Ref Range   Glucose-Capillary 89 70 - 99 mg/dL  Glucose, capillary     Status: Abnormal   Collection Time: 10/10/19 11:46 AM  Result Value Ref Range   Glucose-Capillary 144 (H) 70 - 99 mg/dL  Glucose, capillary     Status: None   Collection Time: 10/10/19  4:39 PM  Result Value Ref Range   Glucose-Capillary 81 70 - 99 mg/dL  Glucose, capillary     Status: Abnormal   Collection Time: 10/10/19  9:09 PM  Result Value Ref Range   Glucose-Capillary 194 (H) 70 - 99 mg/dL   MR ANGIO HEAD WO CONTRAST  Result Date: 10/09/2019 CLINICAL DATA:  Worsening slurred speech. Recent pontine infarct. EXAM: MRI HEAD WITHOUT CONTRAST MRA HEAD WITHOUT CONTRAST TECHNIQUE: Multiplanar, multiecho pulse sequences of the brain and surrounding structures were obtained without intravenous contrast. Angiographic images of the head were obtained using MRA technique without contrast. COMPARISON:  Head CT 10/09/2019, MRI 10/03/2019, and CTA 10/03/2019. Cerebral angiogram 10/05/2019. FINDINGS: MRI HEAD FINDINGS An axial T1 sequence was not performed. Brain: An acute infarct involving the central and left paracentral pons has mildly enlarged from the prior MRI. There are new single punctate acute infarcts in each cerebellar hemisphere, and there are numerous new punctate acute infarcts scattered throughout both cerebral hemispheres involving cortex and white matter of multiple different vascular distributions  as well as involving the left thalamus. There is an unchanged single chronic microhemorrhage in the right temporal lobe. No mass, midline shift, or extra-axial fluid collection is identified. A background of mild chronic small vessel ischemia is again noted in the cerebral white matter. The ventricles and sulci are within normal limits for age. Vascular: Unchanged abnormal appearance of the distal left vertebral artery. Skull and upper  cervical spine: Unremarkable bone marrow signal. Sinuses/Orbits: Unremarkable orbits. Paranasal sinuses and mastoid air cells are clear. Other: None. MRA HEAD FINDINGS The visualized distal right vertebral artery is widely patent and supplies the basilar. The proximal left V4 segment remains occluded. There is retrograde opacification of the distal left V4 segment supplying the left PICA. Patent AICAs and SCAs are seen bilaterally. The basilar artery is patent with a mild stenosis of its midportion as previously seen. There is a moderate-sized right posterior communicating artery. Both PCAs are patent without evidence of significant proximal stenosis. The internal carotid arteries are patent from skull base to carotid termini with similar appearance of bilateral ICA stenoses near the posterior genu, moderate to severe on the left and mild-to-moderate on the right. An infundibulum is noted at the left posterior communicating origin. ACAs and MCAs are patent without evidence of proximal branch occlusion or significant proximal stenosis. No aneurysm is identified. IMPRESSION: 1. Mild enlargement of a pontine infarct since the 10/03/2019 MRI. 2. Numerous new punctate acute infarcts throughout both cerebral and cerebellar hemispheres. 3. Unchanged intracranial atherosclerosis including mild to moderate right and moderate to severe left ICA stenoses and a mild mid basilar artery stenosis. 4. 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 Electronically Signed   By: Sebastian Ache M.D.   On: 10/09/2019 15:21   MR BRAIN WO CONTRAST  Result Date: 10/09/2019 CLINICAL DATA:  Worsening slurred speech. Recent pontine infarct. EXAM: MRI HEAD WITHOUT CONTRAST MRA HEAD WITHOUT CONTRAST TECHNIQUE: Multiplanar, multiecho pulse sequences of the brain and surrounding structures were obtained without intravenous contrast. Angiographic images of the head were obtained using MRA technique without contrast. COMPARISON:  Head CT  10/09/2019, MRI 10/03/2019, and CTA 10/03/2019. Cerebral angiogram 10/05/2019. FINDINGS: MRI HEAD FINDINGS An axial T1 sequence was not performed. Brain: An acute infarct involving the central and left paracentral pons has mildly enlarged from the prior MRI. There are new single punctate acute infarcts in each cerebellar hemisphere, and there are numerous new punctate acute infarcts scattered throughout both cerebral hemispheres involving cortex and white matter of multiple different vascular distributions as well as involving the left thalamus. There is an unchanged single chronic microhemorrhage in the right temporal lobe. No mass, midline shift, or extra-axial fluid collection is identified. A background of mild chronic small vessel ischemia is again noted in the cerebral white matter. The ventricles and sulci are within normal limits for age. Vascular: Unchanged abnormal appearance of the distal left vertebral artery. Skull and upper cervical spine: Unremarkable bone marrow signal. Sinuses/Orbits: Unremarkable orbits. Paranasal sinuses and mastoid air cells are clear. Other: None. MRA HEAD FINDINGS The visualized distal right vertebral artery is widely patent and supplies the basilar. The proximal left V4 segment remains occluded. There is retrograde opacification of the distal left V4 segment supplying the left PICA. Patent AICAs and SCAs are seen bilaterally. The basilar artery is patent with a mild stenosis of its midportion as previously seen. There is a moderate-sized right posterior communicating artery. Both PCAs are patent without evidence of significant proximal stenosis. The internal carotid arteries are  patent from skull base to carotid termini with similar appearance of bilateral ICA stenoses near the posterior genu, moderate to severe on the left and mild-to-moderate on the right. An infundibulum is noted at the left posterior communicating origin. ACAs and MCAs are patent without evidence of  proximal branch occlusion or significant proximal stenosis. No aneurysm is identified. IMPRESSION: 1. Mild enlargement of a pontine infarct since the 10/03/2019 MRI. 2. Numerous new punctate acute infarcts throughout both cerebral and cerebellar hemispheres. 3. Unchanged intracranial atherosclerosis including mild to moderate right and moderate to severe left ICA stenoses and a mild mid basilar artery stenosis. 4. 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 Electronically Signed   By: Sebastian Ache M.D.   On: 10/09/2019 15:21   CT HEAD CODE STROKE WO CONTRAST  Result Date: 10/09/2019 CLINICAL DATA:  Code stroke.  Slurred speech.  Recent stroke. EXAM: CT HEAD WITHOUT CONTRAST TECHNIQUE: Contiguous axial images were obtained from the base of the skull through the vertex without intravenous contrast. COMPARISON:  MRI head 10/03/2019 FINDINGS: Brain: Hypodensity in the central and ventral pons similar to the prior MRI compatible with subacute infarct. 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 Negative for new  infarct.  Negative for acute hemorrhage or mass. Vascular: Negative for hyperdense vessel Skull: Negative Sinuses/Orbits: Negative Other: None ASPECTS ([place]ber[place] Stroke Program Early CT Score) - Ganglionic level infarction (caudate, lentiform nuclei, internal capsule, insula, M1-M3 cortex): 7 - Supraganglionic infarction (M4-M6 cortex): 3 Total score (0-10 with 10 being normal): 10 IMPRESSION: 1. Recent infarct in the central pons unchanged. No new area of infarct or hemorrhage. 2. ASPECTS is 10 3. These results were called by telephone at the time of interpretation on 10/09/2019 at 1:47 pm to provider Wilford Corner , who verbally acknowledged these results. Electronically Signed   By: Marlan Palau M.D.   On: 10/09/2019 13:48    Assessment/Plan: Mrs. [employer] company is not in network for [employer], so she has selected [employer]. Discussed plan for post-acute  rehab with her and her mom. They would be interested in physiatry outpatient follow-up and I will schedule for them. At this time she is sleeping well, moving bowels regularly, and denies pain. Has mild right sided weakness and dysarthria. Has excellent support system in her mother. Thank you for this consult.   Mcarthur Rossetti Angiulli, PA-C 10/11/2019   I have personally performed a face to face diagnostic evaluation, including, but not limited to relevant history and physical exam findings, of this patient and developed relevant assessment and plan.  Additionally, I have reviewed and concur with the physician assistant's documentation above.  The patient's status has not changed. The original post admission physician evaluation remains appropriate, and any changes from the pre-admission screening or documentation from the acute chart are noted above.   Sula Soda, MD

## 2019-10-11 NOTE — TOC Initial Note (Signed)
Transition of Care [facility]) - Initial/Assessment Note    Patient Details  Name: Amy Raymond MRN: [id number] Date of Birth: 1966-05-19  Transition of Care [facility]) CM/SW Contact:    Pollie Friar, RN Phone Number: 10/11/2019, 3:36 PM  Clinical Narrative:                 Recommendations are for CIR. Her insurance is not in network with [facility]. CM met with the patient and she is interested in either [facility] or [facility] for rehab. She also asked that her mother be updated. CM called her mother and she prefers [place] area. CM has put a call out to [facility].  TOC following.  Expected Discharge Plan: IP Rehab Facility Barriers to Discharge: Continued Medical Work up   Patient Goals and CMS Choice   CMS Medicare.gov Compare Post Acute Care list provided to:: Patient Choice offered to / list presented to : Patient, Parent  Expected Discharge Plan and Services Expected Discharge Plan: [place]   Discharge Planning Services: CM Consult Post Acute Care Choice: IP Rehab Living arrangements for the past 2 months: Apartment                                      Prior Living Arrangements/Services Living arrangements for the past 2 months: Apartment Lives with:: Parents Patient language and need for interpreter reviewed:: Yes Do you feel safe going back to the place where you live?: Yes      Need for Family Participation in Patient Care: Yes (Comment) Care giver support system in place?: Yes (comment)   Criminal Activity/Legal Involvement Pertinent to Current Situation/Hospitalization: No - Comment as needed  Activities of Daily Living      Permission Sought/Granted                  Emotional Assessment Appearance:: Appears stated age Attitude/Demeanor/Rapport: Engaged Affect (typically observed): Accepting Orientation: : Oriented to Self, Oriented to Place, Oriented to  Time, Oriented to Situation   Psych Involvement: No (comment)  Admission  diagnosis:  CVA (cerebral vascular accident) ([place]) [I63.9] Acute CVA (cerebrovascular accident) [facility]) [I63.9] Patient Active Problem List   Diagnosis Date Noted  . Embolic stroke ([place]) 47/99/8721  . Hypothyroidism (acquired)   . Hypertension   . Dyslipidemia   . Diabetes mellitus without complication ([place])   . Intractable headache 09/11/2019  . Hypothyroidism 09/11/2019  . Type 2 diabetes mellitus without complication ([place]) 58/72/7618  . Acute CVA (cerebrovascular accident) ([place]) 09/11/2019  . Elevated liver enzymes 09/11/2019   PCP:  [place], Julie M, NP Pharmacy:   [id number] [place] [id number]: [phone]: [phone]    Social Determinants of Health (SDOH) Interventions    Readmission Risk Interventions No flowsheet data found.

## 2019-10-11 NOTE — Progress Notes (Signed)
PROGRESS NOTE    Amy Raymond  XBM:[id number] DOB: 06/02/1966 DOA: 10/09/2019 PCP: [place], Julie M, NP   Brief Narrative:  54 year old lady with prior history of stroke, hypertension, hyperlipidemia, type diabetes presents with slurred speech.  She was found to have new punctate bilateral cerebral and cerebellar infarcts.  She was admitted for further evaluation.   Assessment & Plan:   Active Problems:   Acute CVA (cerebrovascular accident) (HCC)   Embolic stroke (HCC)   Acute CVA MRI shows new punctate bilateral cerebral and cerebellar infarcts in the setting of central pontine infarct last week.  New infarcts embolic in nature. MRI of the head and neck shows moderate right ICA stenosis, moderate to severe left ICA stenosis. Continue with aspirin 325 mg daily and Plavix 75 mg for 3 months followed by aspirin alone. Therapy evaluations recommending CIR and consult is pending.   Permissive hypertension Keep blood pressure parameters less than 220/ 120 mmHg with gradual normalization in  1 week   Type 2 diabetes mellitus CBG (last 3)  Recent Labs    10/10/19 2109 10/11/19 0601 10/11/19 1130  GLUCAP 194* 140* 128*  Slightly uncontrolled, hemoglobin A1c at 8.7 Continue with sliding scale insulin.   Hyperlipidemia LDL is 117 Continue with Lipitor 80 mg daily    Hypothyroidism Continue with Synthroid.  Hypokalemia Replaced  DVT prophylaxis: Heparin Code Status: Full code Family Communication: None at bedside  disposition Plan: Patient days from home, therapy evaluation recommending CIR, CIR consult pending .    Consultants:   Neurology  Procedures:    Antimicrobials: None  Subjective: Denies any chest pain, shortness of breath, nausea, vomiting or abdominal pain denies any headache or dizziness, following simple commands.  Objective: Vitals:   10/10/19 2305 10/11/19 0337 10/11/19 0827 10/11/19 1130  BP: (!) 142/62 (!) 141/66 (!) 155/66 (!) 164/71   Pulse: 69 72 71 77  Resp: 16 18 18 18   Temp: 98.3 F (36.8 C) 98.1 F (36.7 C) 98.6 F (37 C) 98.3 F (36.8 C)  TempSrc: Oral Oral Oral Oral  SpO2: 100% 100% 100% 100%  Weight:      Height:        Intake/Output Summary (Last 24 hours) at 10/11/2019 1456 Last data filed at 10/11/2019 1258 Gross per 24 hour  Intake 360 ml  Output --  Net 360 ml   Filed Weights   10/09/19 1651  Weight: 68.4 kg    Examination:  General exam: Appears calm and comfortable  Respiratory system: Clear to auscultation. Respiratory effort normal. Cardiovascular system: S1 & S2 heard, RRR. No JVD, murmurs, rubs, gallops or clicks. No pedal edema. Gastrointestinal system: Abdomen is nondistended, soft and nontender.  Normal bowel sounds heard. Central nervous system: Alert and oriented.  Dysarthric speech, no motor or sensory deficits at this time. Extremities: Symmetric 5 x 5 power. Skin: No rashes, lesions or ulcers Psychiatry: . Mood & affect appropriate.     Data Reviewed: I have personally reviewed following labs and imaging studies  CBC: Recent Labs  Lab 10/09/19 1337 10/09/19 1633 10/11/19 0902  WBC  --  11.0* 9.4  NEUTROABS  --  7.7 5.9  HGB 10.9* 10.2* 9.3*  HCT 32.0* 32.9* 29.6*  MCV  --  76.3* 74.4*  PLT  --  582* 533*   Basic Metabolic Panel: Recent Labs  Lab 10/05/19 0827 10/06/19 0144 10/09/19 1337 10/09/19 1357 10/10/19 0500  NA 140 142 144 144 143  K 3.2* 3.7 3.4* 3.5 3.4*  CL 106 107 108 108 109  CO2 23 24  --  21* 21*  GLUCOSE 155* 98 101* 99 105*  BUN 5* <5* 10 11 9   CREATININE 0.59 0.69 0.50 0.75 0.62  CALCIUM 8.9 8.9  --  9.5 9.0  MG 1.6* 2.0  --   --  1.8   GFR: Estimated Creatinine Clearance: 75.5 mL/min (by C-G formula based on SCr of 0.62 mg/dL). Liver Function Tests: Recent Labs  Lab 10/09/19 1357  AST 30  ALT 29  ALKPHOS 131*  BILITOT 1.0  PROT 7.4  ALBUMIN 3.3*   No results for input(s): LIPASE, AMYLASE in the last 168 hours. No results  for input(s): AMMONIA in the last 168 hours. Coagulation Profile: Recent Labs  Lab 10/09/19 1357  INR 1.1   Cardiac Enzymes: No results for input(s): CKTOTAL, CKMB, CKMBINDEX, TROPONINI in the last 168 hours. BNP (last 3 results) No results for input(s): PROBNP in the last 8760 hours. HbA1C: No results for input(s): HGBA1C in the last 72 hours. CBG: Recent Labs  Lab 10/10/19 1146 10/10/19 1639 10/10/19 2109 10/11/19 0601 10/11/19 1130  GLUCAP 144* 81 194* 140* 128*   Lipid Profile: No results for input(s): CHOL, HDL, LDLCALC, TRIG, CHOLHDL, LDLDIRECT in the last 72 hours. Thyroid Function Tests: No results for input(s): TSH, T4TOTAL, FREET4, T3FREE, THYROIDAB in the last 72 hours. Anemia Panel: No results for input(s): VITAMINB12, FOLATE, FERRITIN, TIBC, IRON, RETICCTPCT in the last 72 hours. Sepsis Labs: No results for input(s): PROCALCITON, LATICACIDVEN in the last 168 hours.  Recent Results (from the past 240 hour(s))  SARS CORONAVIRUS 2 (TAT 6-24 HRS) Nasopharyngeal Nasopharyngeal Swab     Status: None   Collection Time: 10/03/19  9:45 AM   Specimen: Nasopharyngeal Swab  Result Value Ref Range Status   SARS Coronavirus 2 NEGATIVE NEGATIVE Final    Comment: (NOTE) SARS-CoV-2 target nucleic acids are NOT DETECTED. The SARS-CoV-2 RNA is generally detectable in upper and lower respiratory specimens during the acute phase of infection. Negative results do not preclude SARS-CoV-2 infection, do not rule out co-infections with other pathogens, and should not be used as the sole basis for treatment or other patient management decisions. Negative results must be combined with clinical observations, patient history, and epidemiological information. The expected result is Negative. Fact Sheet for Patients: 10/05/19 Fact Sheet for Healthcare Providers: HairSlick.no This test is not yet approved or cleared by the  [place]ir.com FDA and  has been authorized for detection and/or diagnosis of SARS-CoV-2 by FDA under an Emergency Use Authorization (EUA). This EUA will remain  in effect (meaning this test can be used) for the duration of the COVID-19 declaration under Section 56 4(b)(1) of the Act, 21 U.S.C. section 360bbb-3(b)(1), unless the authorization is terminated or revoked sooner. Performed at [facility], [address], [place], [place]   SARS CORONAVIRUS 2 (TAT 6-24 HRS) Nasopharyngeal Nasopharyngeal Swab     Status: None   Collection Time: 10/09/19  3:55 PM   Specimen: Nasopharyngeal Swab  Result Value Ref Range Status   SARS Coronavirus 2 NEGATIVE NEGATIVE Final    Comment: (NOTE) SARS-CoV-2 target nucleic acids are NOT DETECTED. The SARS-CoV-2 RNA is generally detectable in upper and lower respiratory specimens during the acute phase of infection. Negative results do not preclude SARS-CoV-2 infection, do not rule out co-infections with other pathogens, and should not be used as the sole basis for treatment or other patient management decisions. Negative results must be  combined with clinical observations, patient history, and epidemiological information. The expected result is Negative. Fact Sheet for Patients: SugarRoll.be Fact Sheet for Healthcare Providers: https://www.woods-mathews.com/ This test is not yet approved or cleared by the [place] FDA and  has been authorized for detection and/or diagnosis of SARS-CoV-2 by FDA under an Emergency Use Authorization (EUA). This EUA will remain  in effect (meaning this test can be used) for the duration of the COVID-19 declaration under Section 56 4(b)(1) of the Act, 21 U.S.C. section 360bbb-3(b)(1), unless the authorization is terminated or revoked sooner. Performed at [facility], [place] [address] [place], [place] [address]          Radiology Studies: No  results found.      Scheduled Meds: . aspirin  325 mg Oral Daily  . atorvastatin  80 mg Oral q1800  . cholecalciferol  1,000 Units Oral Daily  . clopidogrel  75 mg Oral Daily  . ferrous sulfate  325 mg Oral Q breakfast  . heparin  5,000 Units Subcutaneous Q8H  . insulin aspart  0-9 Units Subcutaneous TID WC  . levothyroxine  100 mcg Oral QAC breakfast  . sodium chloride flush  3 mL Intravenous Once   Continuous Infusions:   LOS: 2 days        Hosie Poisson, MD [facility]   To contact the attending provider between 7A-7P or the covering provider during after hours 7P-7A, please log into the web site [email or web address] and access using universal [place] password for that web site. If you do not have the password, please call the hospital operator.  10/11/2019, 2:56 PM

## 2019-10-11 NOTE — Progress Notes (Signed)
Physical Therapy Treatment Patient Details Name: Amy Raymond MRN: [id number] DOB: April 24, 1966 Today's Date: 10/11/2019    History of Present Illness  Amy Raymond is a 54 y.o. 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     PT Comments    Pt ambulated 300' with RW and min A, no staggering with use of RW but still occurring when ambulating without AD. Worked on IT trainer within [employer] while turning. Consistent min A needed for safety with all mobility and mod A at times when mobilizing without assistive device. Pt continues to be impulsive. PT will continue to follow.    Follow Up Recommendations  CIR;Supervision/Assistance - 24 hour     Equipment Recommendations  Rolling walker with 5" wheels    Recommendations for Other Services Rehab consult     Precautions / Restrictions Precautions Precautions: Fall Restrictions Weight Bearing Restrictions: No    Mobility  Bed Mobility Overal bed mobility: Needs Assistance Bed Mobility: Supine to Sit     Supine to sit: Supervision     General bed mobility comments: supervision for safety  Transfers Overall transfer level: Needs assistance Equipment used: Rolling walker (2 wheeled) Transfers: Sit to/from Stand Sit to Stand: Min assist;Min guard         General transfer comment: vc's for hand placement. Min A first time, then min-g subsequent stands  Ambulation/Gait Ambulation/Gait assistance: Editor, commissioning (Feet): 300 Feet Assistive device: Rolling walker (2 wheeled) Gait Pattern/deviations: Decreased step length - right;Decreased dorsiflexion - right Gait velocity: decreased Gait velocity interpretation: 1.31 - 2.62 ft/sec, indicative of limited community ambulator General Gait Details: decreased foot clearance R, cues for toe off and df with swing through. No staggering to R today with RW. Worked on IT trainer  within [employer] on turns. AMbulated HHA within room,min A given for stability   Stairs             Wheelchair Mobility    Modified Rankin (Stroke Patients Only) Modified Rankin (Stroke Patients Only) Pre-Morbid Rankin Score: Moderate disability Modified Rankin: Moderately severe disability     Balance Overall balance assessment: Needs assistance Sitting-balance support: Feet supported;No upper extremity supported Sitting balance-Leahy Scale: Fair     Standing balance support: Bilateral upper extremity supported;Single extremity supported;No upper extremity supported Standing balance-Leahy Scale: Poor Standing balance comment: needs external support in standing.                             Cognition Arousal/Alertness: Awake/alert Behavior During Therapy: Impulsive Overall Cognitive Status: Impaired/Different from baseline Area of Impairment: Following commands;Problem solving                       Following Commands: Follows one step commands with increased time;Follows one step commands consistently     Problem Solving: Requires verbal cues General Comments: cues for controlling impulsivity      Exercises      General Comments General comments (skin integrity, edema, etc.): worked on keeping tongue in mouth and mouth closed when working      Pertinent Vitals/Pain Pain Assessment: No/denies pain Faces Pain Scale: No hurt    Home Living                      Prior Function  PT Goals (current goals can now be found in the care plan section) Acute Rehab PT Goals Patient Stated Goal: home PT Goal Formulation: With patient Time For Goal Achievement: 10/24/19 Potential to Achieve Goals: Good Progress towards PT goals: Progressing toward goals    Frequency    Min 4X/week      PT Plan Current plan remains appropriate    Co-evaluation              AM-PAC PT "6 Clicks" Mobility   Outcome Measure  Help  needed turning from your back to your side while in a flat bed without using bedrails?: None Help needed moving from lying on your back to sitting on the side of a flat bed without using bedrails?: None Help needed moving to and from a bed to a chair (including a wheelchair)?: A Little Help needed standing up from a chair using your arms (e.g., wheelchair or bedside chair)?: A Little Help needed to walk in hospital room?: A Little Help needed climbing 3-5 steps with a railing? : A Lot 6 Click Score: 19    End of Session Equipment Utilized During Treatment: Gait belt Activity Tolerance: Patient tolerated treatment well Patient left: with call bell/phone within reach;in chair;with chair alarm set;with family/visitor present Nurse Communication: Mobility status PT Visit Diagnosis: Other abnormalities of gait and mobility (R26.89);Ataxic gait (R26.0)     Time: 3734-2876 PT Time Calculation (min) (ACUTE ONLY): 23 min  Charges:  $Gait Training: 23-37 mins                     Leighton Roach, [place]  Pager [phone] Office [place] 10/11/2019, 4:56 PM

## 2019-10-12 DIAGNOSIS — E1165 Type 2 diabetes mellitus with hyperglycemia: Secondary | ICD-10-CM

## 2019-10-12 DIAGNOSIS — I10 Essential (primary) hypertension: Secondary | ICD-10-CM

## 2019-10-12 LAB — GLUCOSE, CAPILLARY
Glucose-Capillary: 150 mg/dL — ABNORMAL HIGH (ref 70–99)
Glucose-Capillary: 137 mg/dL — ABNORMAL HIGH (ref 70–99)
Glucose-Capillary: 124 mg/dL — ABNORMAL HIGH (ref 70–99)
Glucose-Capillary: 141 mg/dL — ABNORMAL HIGH (ref 70–99)

## 2019-10-12 NOTE — Progress Notes (Signed)
I concur with the assessment and med administration implemented and entered by Alphonse Guild, SN.

## 2019-10-12 NOTE — Progress Notes (Signed)
MBS results    10/11/19 2214  SLP Visit Information  SLP Received On 10/11/19  Pain Assessment  Pain Assessment Faces  Faces Pain Scale 2  Pain Intervention(s) Monitored during session  General Information  HPI Amy Raymond is a 54 y.o. 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 Patient was discharged on 10/03/19 after second CVA in one month. She does have a residue right side weakness. Daughter noticed that on Friday, pt has had episodes of slurred speech and  today she was home and about 1250 her speech became slurred speech, confusion and dizzyness, also seemed have some trouble swallowing. MRI reported: "1. Mild enlargement of a pontine infarct since the 10/03/2019 MRI. 2. Numerous new punctate acute infarcts throughout both cerebral and cerebellar hemispheres." Pt completed a BSE on 09/11/19 with recommendations for regular solids and thin liquids.   Type of Study MBS-Modified Barium Swallow Study  Diet Prior to this Study Dysphagia 1 (puree);Nectar-thick liquids  Temperature Spikes Noted No  Respiratory Status Room air  History of Recent Intubation No  Behavior/Cognition Alert;Cooperative;Pleasant mood  Oral Care Completed by SLP No  Oral Cavity - Dentition Missing dentition  Vision Functional for self feeding  Self-Feeding Abilities Able to feed self  Patient Positioning Upright in chair  Baseline Vocal Quality Normal  Anatomy  (mild bone spurs)  Pharyngeal Secretions Not observed secondary MBS  Oral Assessment (Complete on admission/transfer/change in patient condition)  Does patient have any of the following "at risk" factors? Diet - patient on thickened liquids  Patient is AT RISK Order set for Adult Oral Care Protocol initiated -  "At Risk Patients" option selected (see row information)  Oral Motor/Sensory Function  Overall Oral Motor/Sensory Function Moderate impairment  Facial ROM  Reduced right;Suspected CN VII (facial) dysfunction  Facial Symmetry Abnormal symmetry right  Oral Preparation/Oral Phase  Oral Phase Impaired  Oral - Honey  Oral - Honey Cup WFL  Oral - Nectar  Oral - Nectar Cup Lingual/palatal residue  Oral - Thin  Oral - Thin Cup Left anterior bolus loss;Right anterior bolus loss;Lingual pumping  Oral - Solids  Oral - Puree WFL  Oral - Mech Soft Impaired mastication;Lingual/palatal residue;Delayed oral transit;Decreased bolus cohesion  Pharyngeal Phase  Pharyngeal Phase Impaired  Pharyngeal - Honey  Pharyngeal- Honey Cup WFL  Pharyngeal - Nectar  Pharyngeal- Nectar Cup Penetration/Aspiration during swallow  Pharyngeal Material enters airway, passes BELOW cords then ejected out  Pharyngeal - Thin  Pharyngeal- Thin Cup Penetration/Aspiration during swallow  Pharyngeal - Solids  Pharyngeal- Mechanical Soft Penetration/Aspiration during swallow  Pharyngeal Material enters airway, CONTACTS cords and then ejected out;Material enters airway, CONTACTS cords and not ejected out  Cervical Esophageal Phase  Cervical Esophageal Phase [facility]  Clinical Impression  Clinical Impression Pt demonstrated oropharyngeal dysphagia with penetration/aspiration of thin and nectar thick barium Moderate oral dysphagia consisted of anterior spill, impaired mastication marked by a munch chew pattern and buccal cavity residue. Late closure of laryngeal vestibule led to deep penetration onto vocal cords which momentarily fell below cords. Honey thick liquids were swallowed with greater coordination and timeliness and did not enter vestibule. Mastication time was prolonged, pocketed on right with reduced sensation. Honey thickened liquids are recommended and continue puree texture with great prognosis to advance. Crush pills and intermittent assist checking for pocketed food.   SLP Visit Diagnosis Dysphagia, oropharyngeal phase (R13.12)  Impact on safety and function Mild aspiration  risk;Moderate aspiration risk  Swallow Evaluation Recommendations  SLP Diet Recommendations Dysphagia 1 (Puree) solids;Honey thick liquids  Liquid Administration via Cup  Medication Administration Crushed with puree  Supervision Patient able to self feed;Staff to assist with self feeding  Compensations Slow rate;Small sips/bites;Clear throat intermittently;Multiple dry swallows after each bite/sip  Postural Changes Seated upright at 90 degrees  Treatment Plan  Oral Care Recommendations Oral care BID  Treatment Recommendations Therapy as outlined in treatment plan below  Follow up Recommendations  (tbd)  Speech Therapy Frequency (ACUTE ONLY) min 2x/week  Treatment Duration 2 weeks  Interventions Aspiration precaution training;Compensatory techniques;Patient/family education;Trials of upgraded texture/liquids;Diet toleration management by SLP  Prognosis  Prognosis for Safe Diet Advancement Good  Barriers to Reach Goals Cognitive deficits  Individuals Consulted  Consulted and Agree with Results and Recommendations Patient;RN  Progression Toward Goals  Potential to Achieve Goals (ACUTE ONLY) Good  SLP Time Calculation  SLP Start Time (ACUTE ONLY) 1344  SLP Stop Time (ACUTE ONLY) 1400  SLP Time Calculation (min) (ACUTE ONLY) 16 min  SLP Evaluations  $ SLP Speech Visit 1 Visit  SLP Evaluations  $MBS Swallow 1 Procedure  Orbie Pyo [place] M.Ed Risk analyst [phone] Office [phone]

## 2019-10-12 NOTE — Progress Notes (Signed)
Physical Therapy Treatment Patient Details Name: Amy Raymond MRN: [id number] DOB: 1966/06/25 Today's Date: 10/12/2019    History of Present Illness  Amy Raymond is a 54 y.o. 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     PT Comments    Pt performed gt training and functional mobility this session without device.  She is mildly unsteady in standing and reaching for objects to brace and support her self.  LOB noted to the R x2.  Pt continues to benefit from aggressive rehab in an IRF before returning home with support from her mother.  Plan for high level balance training next session.      Follow Up Recommendations  CIR;Supervision/Assistance - 24 hour     Equipment Recommendations  Rolling walker with 5" wheels    Recommendations for Other Services       Precautions / Restrictions Precautions Precautions: Fall Precaution Comments: fell before presenting to ED Restrictions Weight Bearing Restrictions: No    Mobility  Bed Mobility Overal bed mobility: Needs Assistance Bed Mobility: Supine to Sit;Sit to Supine     Supine to sit: Supervision Sit to supine: Supervision   General bed mobility comments: supervision for safety  Transfers Overall transfer level: Needs assistance Equipment used: Rolling walker (2 wheeled) Transfers: Sit to/from Stand Sit to Stand: Min guard         General transfer comment: VCs for hand placement to and from seated surface.  Mild unseteadiness in standing.  Ambulation/Gait Ambulation/Gait assistance: Min assist;Mod assist Gait Distance (Feet): 60 Feet Assistive device: None Gait Pattern/deviations: Decreased step length - right;Decreased dorsiflexion - right;Ataxic;Drifts right/left;Staggering right;Narrow base of support Gait velocity: decreased   General Gait Details: LOB to the R required moderate assistance to maintain balance.  Pt  with noted furniture walking she required cues for posture reciprocal armswing and increasing BOS.   Stairs             Wheelchair Mobility    Modified Rankin (Stroke Patients Only)       Balance Overall balance assessment: Needs assistance   Sitting balance-Leahy Scale: Fair       Standing balance-Leahy Scale: Poor                              Cognition Arousal/Alertness: Awake/alert Behavior During Therapy: Impulsive Overall Cognitive Status: Impaired/Different from baseline Area of Impairment: Following commands;Problem solving                       Following Commands: Follows one step commands with increased time;Follows one step commands consistently     Problem Solving: Requires verbal cues General Comments: cues for controlling impulsivity      Exercises      General Comments        Pertinent Vitals/Pain Pain Assessment: Faces Faces Pain Scale: No hurt    Home Living                      Prior Function            PT Goals (current goals can now be found in the care plan section) Acute Rehab PT Goals Patient Stated Goal: home Potential to Achieve Goals: Good Progress towards PT goals: Progressing toward goals    Frequency    Min 4X/week      PT Plan  Current plan remains appropriate    Co-evaluation              AM-PAC PT "6 Clicks" Mobility   Outcome Measure  Help needed turning from your back to your side while in a flat bed without using bedrails?: None Help needed moving from lying on your back to sitting on the side of a flat bed without using bedrails?: None Help needed moving to and from a bed to a chair (including a wheelchair)?: A Little Help needed standing up from a chair using your arms (e.g., wheelchair or bedside chair)?: A Little Help needed to walk in hospital room?: A Little Help needed climbing 3-5 steps with a railing? : A Little 6 Click Score: 20    End of Session  Equipment Utilized During Treatment: Gait belt Activity Tolerance: Patient tolerated treatment well Patient left: with call bell/phone within reach;in chair;with chair alarm set;with family/visitor present Nurse Communication: Mobility status PT Visit Diagnosis: Other abnormalities of gait and mobility (R26.89);Ataxic gait (R26.0)     Time: 4383-8184 PT Time Calculation (min) (ACUTE ONLY): 17 min  Charges:  $Gait Training: 8-22 mins                     Bonney Leitz , PTA Acute Rehabilitation Services Pager [phone] Office [phone]     Akram Kissick Artis Delay 10/12/2019, 2:43 PM

## 2019-10-12 NOTE — TOC Progression Note (Signed)
Transition of Care [facility]) - Progression Note    Patient Details  Name: Amy Raymond MRN: [id number] Date of Birth: 1966/04/09  [facility]) CM/SW Contact  Kermit Balo, RN Phone Number: 10/12/2019, 12:26 PM  Clinical Narrative:    [facility] inpatient rehab can not take unless she is denied by the [facility]. CM has spoke to [place] at the [facility] and they are reviewing her information. Awaiting decision from [facility].   Expected Discharge Plan: IP Rehab Facility Barriers to Discharge: Continued Medical Work up  Expected Discharge Plan and Services Expected Discharge Plan: IP Rehab Facility   Discharge Planning Services: CM Consult Post Acute Care Choice: IP Rehab Living arrangements for the past 2 months: Apartment                                       Social Determinants of Health (SDOH) Interventions    Readmission Risk Interventions No flowsheet data found.

## 2019-10-12 NOTE — Progress Notes (Signed)
  Speech Language Pathology Treatment: Dysphagia;Cognitive-Linquistic  Patient Details Name: Amy Raymond MRN: [id number] DOB: 1965-11-26 Today's Date: 10/12/2019 Time: 2992-4268 SLP Time Calculation (min) (ACUTE ONLY): 24 min  Assessment / Plan / Recommendation Clinical Impression  Pt received sitting upright in bed. Nursing present and providing patient medications crushed in puree. Pt with adequate oral acceptance, AP transit and swallow initiation. Pt utilized liquid rinse (HTL) to clear oral residue. ST edu patient re: results of MBSS completed yesterday, pt able to verbalize understanding in teach-back. Pt seen with HTL via cup sips and puree solids. Pt with mildly prolonged oral transit and min R pocketing with puree solids. Pt provided with fading verbal cues to use lingual sweep to check for pocketing, she demonstrated ability to do so. Pt led in completion of effortful swallow exercises, she needed fading visual/verbal cueing. No overt s/sx aspiration with HTL or puree solids.   Pt presents with moderate dysarthria, ST provided edu and demonstration re: strategies to target improved speech intelligibility including use of pacing w/ tactile cueing, slowing rate and over-articulation. Patient demonstrating ability to utilize these strategies successfully. Pt needing continued cueing for carryover of utilization of strategies in conversational speech. ST to follow as per POC.  Recommend continuation of Dysphagia 1/honey thick liquids. Small bites/sips. Check for pocketing. Meds crushed in puree.    HPI HPI: Amy Raymond is a 54 y.o. 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 Patient was discharged on 10/03/19 after second CVA in one month. She does have a residue right side weakness. Daughter noticed that on Friday, pt has had episodes of slurred speech and  today she was home and about 1250 her  speech became slurred speech, confusion and dizzyness, also seemed have some trouble swallowing. MRI reported: "1. Mild enlargement of a pontine infarct since the 10/03/2019 MRI. 2. Numerous new punctate acute infarcts throughout both cerebral and cerebellar hemispheres." Pt completed a BSE on 09/11/19 with recommendations for regular solids and thin liquids.       SLP Plan  Continue with current plan of care       Recommendations  Diet recommendations: Dysphagia 1 (puree);Honey-thick liquid Medication Administration: Crushed with puree Supervision: Intermittent supervision to cue for compensatory strategies;Staff to assist with self feeding Compensations: Slow rate;Small sips/bites;Clear throat intermittently;Multiple dry swallows after each bite/sip Postural Changes and/or Swallow Maneuvers: Seated upright 90 degrees                Oral Care Recommendations: Oral care BID Follow up Recommendations: Home health SLP;Outpatient SLP SLP Visit Diagnosis: Dysphagia, oropharyngeal phase (R13.12);Dysarthria and anarthria (R47.1) Plan: Continue with current plan of care       GO                Gil Ingwersen 10/12/2019, 11:00 AM Shella Spearing, M.Ed., CCC-SLP Speech Therapy Acute Rehabilitation [phone]: Acute Rehab office [phone] - pager

## 2019-10-12 NOTE — Progress Notes (Signed)
PROGRESS NOTE  Amy Raymond DJM:[id number] DOB: 21-Jul-1966   PCP: [place], Julie M, NP  Patient is from: Home  DOA: 10/09/2019 LOS: 3  Brief Narrative / Interim history: 54 year old female with history of CVA, HTN, HLD and DM-2 presenting with slurred speech and found to have new punctate bilateral cerebral and cerebellar infarct on MRI.  Imaging also revealed moderate right ICA stenosis and moderate to severe left ICA stenosis.  Neurology recommended DAPT with aspirin 325 mg daily and Plavix 75 mg daily for 3 months followed by aspirin alone.  Evaluated by physical and Occupational Therapy who recommended CIR. However, patient's insurance is not in network with [facility] inpatient rehab.  She was referred to [facility] inpatient rehab but has to be denied by [facility] before she can be considered.  TOC on board.  Subjective: No major events overnight or this morning.  No complaints.  Denies headache, chest pain, dyspnea, GI or UTI symptoms.  She denies new focal neuro deficit.  Objective: Vitals:   10/12/19 0833 10/12/19 1133 10/12/19 1150 10/12/19 1519  BP: (!) 143/69 127/66  115/62  Pulse: 79 68 70 84  Resp: 14 16  16   Temp: 99.3 F (37.4 C) 99.4 F (37.4 C)  98.5 F (36.9 C)  TempSrc: Oral Oral  Oral  SpO2: 100% 99%  99%  Weight:      Height:        Intake/Output Summary (Last 24 hours) at 10/12/2019 1557 Last data filed at 10/12/2019 1200 Gross per 24 hour  Intake 680 ml  Output 1 ml  Net 679 ml   Filed Weights   10/09/19 1651  Weight: 68.4 kg    Examination:  GENERAL: No acute distress.  Appears well.  HEENT: MMM.  Vision and hearing grossly intact.  NECK: Supple.  No apparent JVD.  RESP:  No IWOB. Good air movement bilaterally. CVS:  RRR. Heart sounds normal.  ABD/GI/GU: Bowel sounds present. Soft. Non tender.  MSK/EXT:  Moves extremities. No apparent deformity. No edema.  SKIN: no apparent skin lesion or wound NEURO: Awake, alert and oriented appropriately.   Slurred speech.  Right facial droop.  Right pronator drift.  Finger-to-nose off on the right.  Seems to have symmetric strength  Procedures:  None  Assessment & Plan: Acute punctate bilateral cerebral and cerebellar infarcts Moderate right ICA stenosis and moderate to severe left ICA stenosis -Neuro-DAPT with full dose aspirin and Plavix for 3 months followed by aspirin alone -PT/OT-CIR. Pt's insurance not in network with CIR. Referral to inpt rehab at OSH -Permissive hypertension for about a week.    Essential hypertension: Normotensive. -Neurology recommended permissive hypertension for about 1 week.  Uncontrolled DM-2 with hyperglycemia: A1c 8.7%. Recent Labs    10/11/19 2052 10/12/19 0616 10/12/19 1148  GLUCAP 119* 150* 137*  -Continue current regimen with statin  Hypothyroidism -Continue home Synthroid  Hyperlipidemia: LDL 117. -Continue Lipitor 80 mg daily                 DVT prophylaxis: Subcu heparin Code Status: Full code Family Communication: Patient and/or RN. Available if any question.  Discharge barrier: Safe disposition which would be inpatient rehab Patient is from: Home Final disposition: Inpatient rehab at outside hospital  Consultants: Neurology   Microbiology summarized: 2/28-COVID-19 negative  Sch Meds:  Scheduled Meds: . aspirin  325 mg Oral Daily  . atorvastatin  80 mg Oral q1800  . cholecalciferol  1,000 Units Oral Daily  . clopidogrel  75 mg  Oral Daily  . ferrous sulfate  325 mg Oral Q breakfast  . heparin  5,000 Units Subcutaneous Q8H  . insulin aspart  0-9 Units Subcutaneous TID WC  . levothyroxine  100 mcg Oral QAC breakfast  . sodium chloride flush  3 mL Intravenous Once   Continuous Infusions: PRN Meds:.acetaminophen **OR** acetaminophen (TYLENOL) oral liquid 160 mg/5 mL **OR** acetaminophen, labetalol, Resource ThickenUp Clear, senna-docusate, white petrolatum  Antimicrobials: Anti-infectives (From admission, onward)     None       I have personally reviewed the following labs and images: CBC: Recent Labs  Lab 10/09/19 1337 10/09/19 1633 10/11/19 0902  WBC  --  11.0* 9.4  NEUTROABS  --  7.7 5.9  HGB 10.9* 10.2* 9.3*  HCT 32.0* 32.9* 29.6*  MCV  --  76.3* 74.4*  PLT  --  582* 533*   BMP &GFR Recent Labs  Lab 10/06/19 0144 10/09/19 1337 10/09/19 1357 10/10/19 0500 10/11/19 1517  NA 142 144 144 143 141  K 3.7 3.4* 3.5 3.4* 3.2*  CL 107 108 108 109 107  CO2 24  --  21* 21* 23  GLUCOSE 98 101* 99 105* 207*  BUN <5* 10 11 9 7   CREATININE 0.69 0.50 0.75 0.62 0.61  CALCIUM 8.9  --  9.5 9.0 9.2  MG 2.0  --   --  1.8  --    Estimated Creatinine Clearance: 75.5 mL/min (by C-G formula based on SCr of 0.61 mg/dL). Liver & Pancreas: Recent Labs  Lab 10/09/19 1357  AST 30  ALT 29  ALKPHOS 131*  BILITOT 1.0  PROT 7.4  ALBUMIN 3.3*   No results for input(s): LIPASE, AMYLASE in the last 168 hours. No results for input(s): AMMONIA in the last 168 hours. Diabetic: No results for input(s): HGBA1C in the last 72 hours. Recent Labs  Lab 10/11/19 1130 10/11/19 1612 10/11/19 2052 10/12/19 0616 10/12/19 1148  GLUCAP 128* 193* 119* 150* 137*   Cardiac Enzymes: No results for input(s): CKTOTAL, CKMB, CKMBINDEX, TROPONINI in the last 168 hours. No results for input(s): PROBNP in the last 8760 hours. Coagulation Profile: Recent Labs  Lab 10/09/19 1357  INR 1.1   Thyroid Function Tests: No results for input(s): TSH, T4TOTAL, FREET4, T3FREE, THYROIDAB in the last 72 hours. Lipid Profile: No results for input(s): CHOL, HDL, LDLCALC, TRIG, CHOLHDL, LDLDIRECT in the last 72 hours. Anemia Panel: No results for input(s): VITAMINB12, FOLATE, FERRITIN, TIBC, IRON, RETICCTPCT in the last 72 hours. Urine analysis:    Component Value Date/Time   COLORURINE YELLOW 10/03/2019 0814   APPEARANCEUR CLEAR 10/03/2019 0814   LABSPEC <1.005 (L) 10/03/2019 0814   PHURINE 5.5 10/03/2019 0814    GLUCOSEU NEGATIVE 10/03/2019 0814   HGBUR NEGATIVE 10/03/2019 0814   BILIRUBINUR NEGATIVE 10/03/2019 0814   KETONESUR 15 (A) 10/03/2019 0814   PROTEINUR NEGATIVE 10/03/2019 0814   UROBILINOGEN 1.0 03/04/2007 1518   NITRITE NEGATIVE 10/03/2019 0814   LEUKOCYTESUR NEGATIVE 10/03/2019 0814   Sepsis Labs: Invalid input(s): PROCALCITONIN, LACTICIDVEN  Microbiology: Recent Results (from the past 240 hour(s))  SARS CORONAVIRUS 2 (TAT 6-24 HRS) Nasopharyngeal Nasopharyngeal Swab     Status: None   Collection Time: 10/03/19  9:45 AM   Specimen: Nasopharyngeal Swab  Result Value Ref Range Status   SARS Coronavirus 2 NEGATIVE NEGATIVE Final    Comment: (NOTE) SARS-CoV-2 target nucleic acids are NOT DETECTED. The SARS-CoV-2 RNA is generally detectable in upper and lower respiratory specimens during the acute phase of infection.  Negative results do not preclude SARS-CoV-2 infection, do not rule out co-infections with other pathogens, and should not be used as the sole basis for treatment or other patient management decisions. Negative results must be combined with clinical observations, patient history, and epidemiological information. The expected result is Negative. Fact Sheet for Patients: HairSlick.no Fact Sheet for Healthcare Providers: quierodirigir.com This test is not yet approved or cleared by the [place] FDA and  has been authorized for detection and/or diagnosis of SARS-CoV-2 by FDA under an Emergency Use Authorization (EUA). This EUA will remain  in effect (meaning this test can be used) for the duration of the COVID-19 declaration under Section 56 4(b)(1) of the Act, 21 U.S.C. section 360bbb-3(b)(1), unless the authorization is terminated or revoked sooner. Performed at [facility], [address], [place], [place] [address]   SARS CORONAVIRUS 2 (TAT 6-24 HRS) Nasopharyngeal Nasopharyngeal Swab     Status:  None   Collection Time: 10/09/19  3:55 PM   Specimen: Nasopharyngeal Swab  Result Value Ref Range Status   SARS Coronavirus 2 NEGATIVE NEGATIVE Final    Comment: (NOTE) SARS-CoV-2 target nucleic acids are NOT DETECTED. The SARS-CoV-2 RNA is generally detectable in upper and lower respiratory specimens during the acute phase of infection. Negative results do not preclude SARS-CoV-2 infection, do not rule out co-infections with other pathogens, and should not be used as the sole basis for treatment or other patient management decisions. Negative results must be combined with clinical observations, patient history, and epidemiological information. The expected result is Negative. Fact Sheet for Patients: HairSlick.no Fact Sheet for Healthcare Providers: quierodirigir.com This test is not yet approved or cleared by the [place] FDA and  has been authorized for detection and/or diagnosis of SARS-CoV-2 by FDA under an Emergency Use Authorization (EUA). This EUA will remain  in effect (meaning this test can be used) for the duration of the COVID-19 declaration under Section 56 4(b)(1) of the Act, 21 U.S.C. section 360bbb-3(b)(1), unless the authorization is terminated or revoked sooner. Performed at [facility], [address] [place], [place] [address]     Radiology Studies: No results found.    Jasalyn Frysinger T. Fate Galanti [facility]  If 7PM-7AM, please contact night-coverage [email or web address] Password [facility] 10/12/2019, 3:57 PM

## 2019-10-13 LAB — GLUCOSE, CAPILLARY
Glucose-Capillary: 135 mg/dL — ABNORMAL HIGH (ref 70–99)
Glucose-Capillary: 153 mg/dL — ABNORMAL HIGH (ref 70–99)
Glucose-Capillary: 215 mg/dL — ABNORMAL HIGH (ref 70–99)
Glucose-Capillary: 129 mg/dL — ABNORMAL HIGH (ref 70–99)

## 2019-10-13 NOTE — Progress Notes (Signed)
Occupational Therapy Treatment Patient Details Name: Amy Raymond MRN: [id number] DOB: August 11, 1966 Today's Date: 10/13/2019    History of present illness  Amy Raymond is a 54 y.o. 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 New punctate B cerebral and cerebellar infarcts in setting of central pontine infarct last week.   OT comments  Excellent participation with OT. Focus of session on ADL retraining and facilitation of normal movement patterns to increase functional independence. Pt is an excellent CIR candidate. Will continue to follow acutely.  Follow Up Recommendations  CIR;Supervision/Assistance - 24 hour    Equipment Recommendations  None recommended by OT    Recommendations for Other Services Rehab consult;PT consult;Speech consult    Precautions / Restrictions Precautions Precautions: Fall Precaution Comments: fell before presenting to ED Restrictions Weight Bearing Restrictions: No       Mobility Bed Mobility Overal bed mobility: Needs Assistance Bed Mobility: Supine to Sit;Sit to Supine     Supine to sit: Supervision Sit to supine: Supervision   General bed mobility comments: supervision for safety  Transfers Overall transfer level: Needs assistance Equipment used:  Transfers: Sit to/from [facility] Sit to Stand: Min assist Stand pivot transfers: Min assist       General transfer comment: Pt pushes with BLE against bed to stabilize. Poor anterior weight transition and compensates by pushing posteriorly    Balance Overall balance assessment: Needs assistance Sitting-balance support: Feet supported;No upper extremity supported Sitting balance-Leahy Scale: Fair Sitting balance - Comments: posterior bias     Standing balance-Leahy Scale: Poor                             ADL either performed or assessed with clinical judgement    ADL Overall ADL's : Needs assistance/impaired Eating/Feeding: Set up;Supervision/ safety;Sitting   Grooming: Wash/dry hands;Standing;Minimal assistance;Wash/dry face   Upper Body Bathing: Minimal assistance;Sitting   Lower Body Bathing: Minimal assistance;Sit to/from stand   Upper Body Dressing : Minimal assistance;Sitting   Lower Body Dressing: Moderate assistance;Sit to/from stand   Toilet Transfer: Minimal assistance;Ambulation     Toileting - Clothing Manipulation Details (indicate cue type and reason): Min A for balance and to manage gown     Functional mobility during ADLs: Minimal assistance       Vision   Additional Comments: reports no change from baseline   Perception     Praxis      Cognition Arousal/Alertness: Awake/alert Behavior During Therapy: Impulsive Overall Cognitive Status: Impaired/Different from baseline Area of Impairment: Attention;Safety/judgement;Awareness;Problem solving                   Current Attention Level: Selective   Following Commands: Follows one step commands consistently Safety/Judgement: Decreased awareness of safety;Decreased awareness of deficits Awareness: Emergent Problem Solving: Slow processing;Requires verbal cues General Comments: teach back used to increase insight into deficits        Exercises Exercises: Other Exercises Other Exercises: cup stacking - alternating "A" " B"sequence incorporateing diagonal movemetn patterns and facilitationg trunk elongation adn increased use of R shoulder Other Exercises: rythmic stabilization in sitting /standing Other Exercises: repetitive sit - stand tp facilitate anterior weight shift theraputty ex - level 1 - handout provided   Shoulder Instructions       General Comments      Pertinent Vitals/ Pain       Pain Assessment:  No/denies pain Faces Pain Scale: No hurt  Home Living                                          Prior  Functioning/Environment              Frequency  Min 2X/week        Progress Toward Goals  OT Goals(current goals can now be found in the care plan section)  Progress towards OT goals: Progressing toward goals  Acute Rehab OT Goals Patient Stated Goal: to go to rehab OT Goal Formulation: With patient Time For Goal Achievement: 10/19/19 Potential to Achieve Goals: Good ADL Goals Pt Will Perform Grooming: with set-up;with supervision;standing Pt Will Perform Lower Body Dressing: with set-up;with supervision;sit to/from stand Pt Will Transfer to Toilet: with set-up;with supervision;ambulating;bedside commode Pt Will Perform Toileting - Clothing Manipulation and hygiene: with set-up;with supervision;sit to/from stand;sitting/lateral leans Pt Will Perform Tub/Shower Transfer: Tub transfer;with modified independence;ambulating Pt/caregiver will Perform Home Exercise Program: Right Upper extremity;With written HEP provided;Independently;Increased strength;Increased ROM Additional ADL Goal #1: Pt will incorporate RUE into 80% on ADL task with 2-3 cues  Plan Discharge plan remains appropriate    Co-evaluation                 AM-PAC OT "6 Clicks" Daily Activity     Outcome Measure   Help from another person eating meals?: A Little Help from another person taking care of personal grooming?: A Little Help from another person toileting, which includes using toliet, bedpan, or urinal?: A Little Help from another person bathing (including washing, rinsing, drying)?: A Lot Help from another person to put on and taking off regular upper body clothing?: A Little Help from another person to put on and taking off regular lower body clothing?: A Lot 6 Click Score: 16    End of Session Equipment Utilized During Treatment: Gait belt  OT Visit Diagnosis: Other symptoms and signs involving the nervous system (R29.898);Unsteadiness on feet (R26.81);Other abnormalities of gait and  mobility (R26.89);Muscle weakness (generalized) (M62.81)   Activity Tolerance Patient tolerated treatment well   Patient Left in chair;with call bell/phone within reach;with chair alarm set;with family/visitor present   Nurse Communication Mobility status        Time: 2458-0998 OT Time Calculation (min): 39 min  Charges: OT General Charges $OT Visit: 1 Visit OT Treatments $Self Care/Home Management : 8-22 mins $Neuromuscular Re-education: 23-37 mins  Luisa Dago, OT/L   Acute OT Clinical Specialist Acute Rehabilitation Services Pager [phone] Office [phone]    [facility] 10/13/2019, 5:49 PM

## 2019-10-13 NOTE — TOC Progression Note (Signed)
Transition of Care [facility]) - Progression Note    Patient Details  Name: Amy Raymond MRN: [id number] Date of Birth: 09-Aug-1966  [facility] CM/SW Contact  Kermit Balo, RN Phone Number: 10/13/2019, 9:22 AM  Clinical Narrative:    [facility] patient for rehab. CM asked PT to resee to make sure she still requires CIR. Information sent to [facility] after PT reevaluated. They will start auth on patient today.  TOC following.   Expected Discharge Plan: IP Rehab Facility Barriers to Discharge: Continued Medical Work up  Expected Discharge Plan and Services Expected Discharge Plan: IP Rehab Facility   Discharge Planning Services: CM Consult Post Acute Care Choice: IP Rehab Living arrangements for the past 2 months: Apartment                                       Social Determinants of Health (SDOH) Interventions    Readmission Risk Interventions No flowsheet data found.

## 2019-10-13 NOTE — Progress Notes (Signed)
PROGRESS NOTE  Amy Raymond:[id number] DOB: [birth year]   PCP: [place], Julie M, NP  Patient is from: Home  DOA: 10/09/2019 LOS: 4  Brief Narrative / Interim history: 54 year old female with history of CVA, HTN, HLD and DM-2 presenting with slurred speech and found to have new punctate bilateral cerebral and cerebellar infarct on MRI.  Imaging also revealed moderate right ICA stenosis and moderate to severe left ICA stenosis.  Neurology recommended DAPT with aspirin 325 mg daily and Plavix 75 mg daily for 3 months followed by aspirin alone.  Evaluated by physical and Occupational Therapy who recommended CIR. However, patient's insurance is not in network with [facility] inpatient rehab.  Waiting on insurance authorization for inpatient rehab at [facility].  Subjective: No major events overnight or this morning.  No complaints.  Denies chest pain, dyspnea, new focal neuro deficit.  Objective: Vitals:   10/12/19 2351 10/13/19 0300 10/13/19 0800 10/13/19 1157  BP: 116/64 121/61 128/64 (!) 131/58  Pulse:   73 77  Resp: 18 16  17   Temp: 98.2 F (36.8 C) 97.6 F (36.4 C) 98 F (36.7 C) 98.7 F (37.1 C)  TempSrc: Oral Oral Oral Oral  SpO2: 100% 98% 100% 100%  Weight:      Height:        Intake/Output Summary (Last 24 hours) at 10/13/2019 1458 Last data filed at 10/13/2019 0900 Gross per 24 hour  Intake 240 ml  Output --  Net 240 ml   Filed Weights   10/09/19 1651  Weight: 68.4 kg    Examination:  GENERAL: No acute distress.  Appears well.  HEENT: MMM.  Vision and hearing grossly intact.  NECK: Supple.  No apparent JVD.  RESP:  No IWOB. Good air movement bilaterally. CVS:  RRR. Heart sounds normal.  ABD/GI/GU: Bowel sounds present. Soft. Non tender.  MSK/EXT:  Moves extremities. No apparent deformity. No edema.  SKIN: no apparent skin lesion or wound NEURO: Awake, alert and oriented appropriately.  Slurred speech.  Right facial droop.  Right pronator drift.   Finger-to-nose off on the right.  Seems to have symmetric strength. PSYCH: Calm. Normal affect.  Procedures:  None  Assessment & Plan: Acute punctate bilateral cerebral and cerebellar infarcts Moderate right ICA stenosis and moderate to severe left ICA stenosis -Neuro-DAPT with full dose aspirin and Plavix for 3 months followed by aspirin alone -PT/OT-CIR. Pt's insurance not in network with [facility]. waiting on insurance authorization for inpatient rehab at OSH. -Permissive hypertension for about a week.    Essential hypertension: Normotensive. -Neurology recommended permissive hypertension for about 1 week.  Uncontrolled DM-2 with hyperglycemia: A1c 8.7%. Recent Labs    10/12/19 2120 10/13/19 0622 10/13/19 1140  GLUCAP 141* 135* 153*  -Continue current regimen with statin  Hypothyroidism -Continue home Synthroid  Hyperlipidemia: LDL 117. -Continue Lipitor 80 mg daily                 DVT prophylaxis: Subcu heparin Code Status: Full code Family Communication: Patient and/or RN. Available if any question.  Discharge barrier: Safe disposition which would be inpatient rehab.  Waiting on insurance authorization. Patient is from: Home Final disposition: Inpatient rehab at outside hospital  Consultants: Neurology   Microbiology summarized: 2/28-COVID-19 negative  Sch Meds:  Scheduled Meds: . aspirin  325 mg Oral Daily  . atorvastatin  80 mg Oral q1800  . cholecalciferol  1,000 Units Oral Daily  . clopidogrel  75 mg Oral Daily  . ferrous sulfate  325  mg Oral Q breakfast  . heparin  5,000 Units Subcutaneous Q8H  . insulin aspart  0-9 Units Subcutaneous TID WC  . levothyroxine  100 mcg Oral QAC breakfast  . sodium chloride flush  3 mL Intravenous Once   Continuous Infusions: PRN Meds:.acetaminophen **OR** acetaminophen (TYLENOL) oral liquid 160 mg/5 mL **OR** acetaminophen, labetalol, Resource ThickenUp Clear, senna-docusate, white  petrolatum  Antimicrobials: Anti-infectives (From admission, onward)   None       I have personally reviewed the following labs and images: CBC: Recent Labs  Lab 10/09/19 1337 10/09/19 1633 10/11/19 0902  WBC  --  11.0* 9.4  NEUTROABS  --  7.7 5.9  HGB 10.9* 10.2* 9.3*  HCT 32.0* 32.9* 29.6*  MCV  --  76.3* 74.4*  PLT  --  582* 533*   BMP &GFR Recent Labs  Lab 10/09/19 1337 10/09/19 1357 10/10/19 0500 10/11/19 1517  NA 144 144 143 141  K 3.4* 3.5 3.4* 3.2*  CL 108 108 109 107  CO2  --  21* 21* 23  GLUCOSE 101* 99 105* 207*  BUN 10 11 9 7   CREATININE 0.50 0.75 0.62 0.61  CALCIUM  --  9.5 9.0 9.2  MG  --   --  1.8  --    Estimated Creatinine Clearance: 75.5 mL/min (by C-G formula based on SCr of 0.61 mg/dL). Liver & Pancreas: Recent Labs  Lab 10/09/19 1357  AST 30  ALT 29  ALKPHOS 131*  BILITOT 1.0  PROT 7.4  ALBUMIN 3.3*   No results for input(s): LIPASE, AMYLASE in the last 168 hours. No results for input(s): AMMONIA in the last 168 hours. Diabetic: No results for input(s): HGBA1C in the last 72 hours. Recent Labs  Lab 10/12/19 1148 10/12/19 1637 10/12/19 2120 10/13/19 0622 10/13/19 1140  GLUCAP 137* 124* 141* 135* 153*   Cardiac Enzymes: No results for input(s): CKTOTAL, CKMB, CKMBINDEX, TROPONINI in the last 168 hours. No results for input(s): PROBNP in the last 8760 hours. Coagulation Profile: Recent Labs  Lab 10/09/19 1357  INR 1.1   Thyroid Function Tests: No results for input(s): TSH, T4TOTAL, FREET4, T3FREE, THYROIDAB in the last 72 hours. Lipid Profile: No results for input(s): CHOL, HDL, LDLCALC, TRIG, CHOLHDL, LDLDIRECT in the last 72 hours. Anemia Panel: No results for input(s): VITAMINB12, FOLATE, FERRITIN, TIBC, IRON, RETICCTPCT in the last 72 hours. Urine analysis:    Component Value Date/Time   COLORURINE YELLOW 10/03/2019 0814   APPEARANCEUR CLEAR 10/03/2019 0814   LABSPEC <1.005 (L) 10/03/2019 0814   PHURINE 5.5  10/03/2019 0814   GLUCOSEU NEGATIVE 10/03/2019 0814   HGBUR NEGATIVE 10/03/2019 0814   BILIRUBINUR NEGATIVE 10/03/2019 0814   KETONESUR 15 (A) 10/03/2019 0814   PROTEINUR NEGATIVE 10/03/2019 0814   UROBILINOGEN 1.0 03/04/2007 1518   NITRITE NEGATIVE 10/03/2019 0814   LEUKOCYTESUR NEGATIVE 10/03/2019 0814   Sepsis Labs: Invalid input(s): PROCALCITONIN, LACTICIDVEN  Microbiology: Recent Results (from the past 240 hour(s))  SARS CORONAVIRUS 2 (TAT 6-24 HRS) Nasopharyngeal Nasopharyngeal Swab     Status: None   Collection Time: 10/09/19  3:55 PM   Specimen: Nasopharyngeal Swab  Result Value Ref Range Status   SARS Coronavirus 2 NEGATIVE NEGATIVE Final    Comment: (NOTE) SARS-CoV-2 target nucleic acids are NOT DETECTED. The SARS-CoV-2 RNA is generally detectable in upper and lower respiratory specimens during the acute phase of infection. Negative results do not preclude SARS-CoV-2 infection, do not rule out co-infections with other pathogens, and should not be used  as the sole basis for treatment or other patient management decisions. Negative results must be combined with clinical observations, patient history, and epidemiological information. The expected result is Negative. Fact Sheet for Patients: HairSlick.no Fact Sheet for Healthcare Providers: quierodirigir.com This test is not yet approved or cleared by the [place] FDA and  has been authorized for detection and/or diagnosis of SARS-CoV-2 by FDA under an Emergency Use Authorization (EUA). This EUA will remain  in effect (meaning this test can be used) for the duration of the COVID-19 declaration under Section 56 4(b)(1) of the Act, 21 U.S.C. section 360bbb-3(b)(1), unless the authorization is terminated or revoked sooner. Performed at [facility], [address] [place], [place] [address]     Radiology Studies: No results found.    Dimarco Minkin T.  Michaelah Credeur [facility]  If 7PM-7AM, please contact night-coverage [email or web address] Password [facility] 10/13/2019, 2:58 PM

## 2019-10-13 NOTE — Progress Notes (Signed)
Physical Therapy Treatment Patient Details Name: DELILIAH SPRANGER MRN: [id number] DOB: May 02, 1966 Today's Date: 10/13/2019    History of Present Illness  Amy Raymond is a 54 y.o. 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     PT Comments    Pt performed standing exercises and gt training without device to challenge balance.  She continues to require cues for safety.  She remains an excellent candidate for aggressive therapies to improve strength, balance and coordination before returning home.     Follow Up Recommendations  CIR;Supervision/Assistance - 24 hour     Equipment Recommendations  Rolling walker with 5" wheels    Recommendations for Other Services       Precautions / Restrictions Precautions Precautions: Fall Precaution Comments: fell before presenting to ED Restrictions Weight Bearing Restrictions: No    Mobility  Bed Mobility Overal bed mobility: Needs Assistance Bed Mobility: Supine to Sit;Sit to Supine     Supine to sit: Supervision Sit to supine: Supervision   General bed mobility comments: supervision for safety  Transfers Overall transfer level: Needs assistance Equipment used: Rolling walker (2 wheeled) Transfers: Sit to/from Stand Sit to Stand: Min guard         General transfer comment: VCs for hand placement to and from seated surface.  Mild unseteadiness in standing.  Ambulation/Gait Ambulation/Gait assistance: Min assist;Mod assist Gait Distance (Feet): 80 Feet Assistive device: None Gait Pattern/deviations: Decreased step length - right;Decreased dorsiflexion - right;Ataxic;Drifts right/left;Staggering right;Narrow base of support Gait velocity: decreased   General Gait Details: Pt presented with LOB to the R with moderate assistance to correct.  Pt with continued staggering this session.  She is slow and guarded with decreased stride  length.   Stairs             Wheelchair Mobility    Modified Rankin (Stroke Patients Only)       Balance Overall balance assessment: Needs assistance Sitting-balance support: Feet supported;No upper extremity supported Sitting balance-Leahy Scale: Fair       Standing balance-Leahy Scale: Poor                              Cognition Arousal/Alertness: Awake/alert Behavior During Therapy: Impulsive Overall Cognitive Status: Impaired/Different from baseline Area of Impairment: Following commands;Problem solving                       Following Commands: Follows one step commands with increased time;Follows one step commands consistently     Problem Solving: Requires verbal cues General Comments: cues for controlling impulsivity      Exercises General Exercises - Lower Extremity Hip ABduction/ADduction: AROM;Both;10 reps;Standing Hip Flexion/Marching: AROM;Both;10 reps;Standing Heel Raises: AROM;Both;10 reps;Standing Mini-Sqauts: AROM;Both;10 reps;Standing    General Comments        Pertinent Vitals/Pain Pain Assessment: Faces Faces Pain Scale: No hurt    Home Living                      Prior Function            PT Goals (current goals can now be found in the care plan section) Acute Rehab PT Goals Patient Stated Goal: home Potential to Achieve Goals: Good Progress towards PT goals: Progressing toward goals    Frequency    Min 4X/week      PT Plan  Current plan remains appropriate    Co-evaluation              AM-PAC PT "6 Clicks" Mobility   Outcome Measure  Help needed turning from your back to your side while in a flat bed without using bedrails?: None Help needed moving from lying on your back to sitting on the side of a flat bed without using bedrails?: None Help needed moving to and from a bed to a chair (including a wheelchair)?: A Little Help needed standing up from a chair using your arms  (e.g., wheelchair or bedside chair)?: A Little Help needed to walk in hospital room?: A Little Help needed climbing 3-5 steps with a railing? : A Little 6 Click Score: 20    End of Session Equipment Utilized During Treatment: Gait belt Activity Tolerance: Patient tolerated treatment well Patient left: with call bell/phone within reach;in chair;with chair alarm set;with family/visitor present Nurse Communication: Mobility status PT Visit Diagnosis: Other abnormalities of gait and mobility (R26.89);Ataxic gait (R26.0)     Time: 1350-1410 PT Time Calculation (min) (ACUTE ONLY): 20 min  Charges:  $Gait Training: 8-22 mins                     Erasmo Leventhal , PTA Acute Rehabilitation Services Pager [phone] Office [phone]     Alysson Geist Eli Hose 10/13/2019, 5:01 PM

## 2019-10-14 LAB — GLUCOSE, CAPILLARY
Glucose-Capillary: 152 mg/dL — ABNORMAL HIGH (ref 70–99)
Glucose-Capillary: 173 mg/dL — ABNORMAL HIGH (ref 70–99)
Glucose-Capillary: 191 mg/dL — ABNORMAL HIGH (ref 70–99)
Glucose-Capillary: 146 mg/dL — ABNORMAL HIGH (ref 70–99)

## 2019-10-14 NOTE — TOC Progression Note (Addendum)
Transition of Care [facility]) - Progression Note    Patient Details  Name: Amy Raymond MRN: [id number] Date of Birth: Sep 28, 1965  Transition of Care [facility]) CM/SW Contact  Kermit Balo, RN Phone Number: 10/14/2019, 9:11 AM  Clinical Narrative:    Received information late yesterday that [facility] is not able to get insurance auth through [facility] unless [facility] inpatient rehab doesn't have a bed or refuses admit. CM spoke to pt, her mother and [facility] and faxed her information to [facility].  TOC following.  Addendum: 1517: [facility] is starting authorization for admission. [facility] hopes to have answer on Monday.  Expected Discharge Plan: IP Rehab Facility Barriers to Discharge: Continued Medical Work up  Expected Discharge Plan and Services Expected Discharge Plan: IP Rehab Facility   Discharge Planning Services: CM Consult Post Acute Care Choice: IP Rehab Living arrangements for the past 2 months: Apartment                                       Social Determinants of Health (SDOH) Interventions    Readmission Risk Interventions No flowsheet data found.

## 2019-10-14 NOTE — Progress Notes (Signed)
PROGRESS NOTE  Amy Raymond XTK:[id number] DOB: [birth year]   PCP: [place], Julie M, NP  Patient is from: Home  DOA: 10/09/2019 LOS: 5  Brief Narrative / Interim history: 54 year old female with history of CVA, HTN, HLD and DM-2 presenting with slurred speech and found to have new punctate bilateral cerebral and cerebellar infarct on MRI.  Imaging also revealed moderate right ICA stenosis and moderate to severe left ICA stenosis.  Neurology recommended DAPT with aspirin 325 mg daily and Plavix 75 mg daily for 3 months followed by aspirin alone.  Evaluated by physical and Occupational Therapy who recommended CIR. However, patient's insurance is not in network with [facility] inpatient rehab.  Waiting on insurance authorization for inpatient rehab at [facility].  Subjective: No major events overnight or this morning.  No complaint this morning.  Denies chest pain, dyspnea, GI, UTI or new focal neuro deficits.  Objective: Vitals:   10/14/19 0035 10/14/19 0425 10/14/19 0805 10/14/19 1134  BP: (!) 106/57 (!) 116/59 119/65 121/68  Pulse: 62 72 79 78  Resp:  18 20 18   Temp:  98.1 F (36.7 C) 98.5 F (36.9 C) 97.9 F (36.6 C)  TempSrc:  Oral Oral Oral  SpO2:  99% 100% 100%  Weight:      Height:       No intake or output data in the 24 hours ending 10/14/19 1249 Filed Weights   10/09/19 1651  Weight: 68.4 kg    Examination:  GENERAL: No acute distress.  Appears well.  HEENT: MMM.  Vision and hearing grossly intact.  NECK: Supple.  No apparent JVD.  RESP:  No IWOB. Good air movement bilaterally. CVS:  RRR. Heart sounds normal.  ABD/GI/GU: Bowel sounds present. Soft. Non tender.  MSK/EXT:  Moves extremities. No apparent deformity. No edema.  SKIN: no apparent skin lesion or wound NEURO: Awake, alert and oriented appropriately.  Slurred speech.  Right facial droop.  Right pronator drift.  Finger-to-nose off on the right.  Motor 4/5 in RUE and RLE.  5/5 in LUE and LLE. PSYCH:  Calm. Normal affect.   Procedures:  None  Assessment & Plan: Acute punctate bilateral cerebral and cerebellar infarcts Moderate right ICA stenosis and moderate to severe left ICA stenosis -Neuro-DAPT with full dose aspirin and Plavix for 3 months followed by aspirin alone -PT/OT-CIR. Pt's insurance not in network with CIR. waiting on insurance authorization for inpatient rehab at OSH. -Permissive hypertension for about a week.    Essential hypertension: Normotensive. -Neurology recommended permissive hypertension for about 1 week.  Uncontrolled DM-2 with hyperglycemia: A1c 8.7%. Recent Labs    10/13/19 2137 10/14/19 0646 10/14/19 1132  GLUCAP 129* 152* 173*  -Continue current regimen with statin  Hypothyroidism -Continue home Synthroid  Hyperlipidemia: LDL 117. -Continue Lipitor 80 mg daily                 DVT prophylaxis: Subcu heparin Code Status: Full code Family Communication: Patient and/or RN. Available if any question.  Discharge barrier: Safe disposition which would be inpatient rehab.  Waiting on insurance authorization. Patient is from: Home Final disposition: Inpatient rehab at outside hospital  Consultants: Neurology (off)   Microbiology summarized: 2/28-COVID-19 negative  Sch Meds:  Scheduled Meds: . aspirin  325 mg Oral Daily  . atorvastatin  80 mg Oral q1800  . cholecalciferol  1,000 Units Oral Daily  . clopidogrel  75 mg Oral Daily  . ferrous sulfate  325 mg Oral Q breakfast  . heparin  5,000 Units Subcutaneous Q8H  . insulin aspart  0-9 Units Subcutaneous TID WC  . levothyroxine  100 mcg Oral QAC breakfast  . sodium chloride flush  3 mL Intravenous Once   Continuous Infusions: PRN Meds:.acetaminophen **OR** acetaminophen (TYLENOL) oral liquid 160 mg/5 mL **OR** acetaminophen, labetalol, Resource ThickenUp Clear, senna-docusate, white petrolatum  Antimicrobials: Anti-infectives (From admission, onward)   None       I have  personally reviewed the following labs and images: CBC: Recent Labs  Lab 10/09/19 1337 10/09/19 1633 10/11/19 0902  WBC  --  11.0* 9.4  NEUTROABS  --  7.7 5.9  HGB 10.9* 10.2* 9.3*  HCT 32.0* 32.9* 29.6*  MCV  --  76.3* 74.4*  PLT  --  582* 533*   BMP &GFR Recent Labs  Lab 10/09/19 1337 10/09/19 1357 10/10/19 0500 10/11/19 1517  NA 144 144 143 141  K 3.4* 3.5 3.4* 3.2*  CL 108 108 109 107  CO2  --  21* 21* 23  GLUCOSE 101* 99 105* 207*  BUN 10 11 9 7   CREATININE 0.50 0.75 0.62 0.61  CALCIUM  --  9.5 9.0 9.2  MG  --   --  1.8  --    Estimated Creatinine Clearance: 75.5 mL/min (by C-G formula based on SCr of 0.61 mg/dL). Liver & Pancreas: Recent Labs  Lab 10/09/19 1357  AST 30  ALT 29  ALKPHOS 131*  BILITOT 1.0  PROT 7.4  ALBUMIN 3.3*   No results for input(s): LIPASE, AMYLASE in the last 168 hours. No results for input(s): AMMONIA in the last 168 hours. Diabetic: No results for input(s): HGBA1C in the last 72 hours. Recent Labs  Lab 10/13/19 1140 10/13/19 1607 10/13/19 2137 10/14/19 0646 10/14/19 1132  GLUCAP 153* 215* 129* 152* 173*   Cardiac Enzymes: No results for input(s): CKTOTAL, CKMB, CKMBINDEX, TROPONINI in the last 168 hours. No results for input(s): PROBNP in the last 8760 hours. Coagulation Profile: Recent Labs  Lab 10/09/19 1357  INR 1.1   Thyroid Function Tests: No results for input(s): TSH, T4TOTAL, FREET4, T3FREE, THYROIDAB in the last 72 hours. Lipid Profile: No results for input(s): CHOL, HDL, LDLCALC, TRIG, CHOLHDL, LDLDIRECT in the last 72 hours. Anemia Panel: No results for input(s): VITAMINB12, FOLATE, FERRITIN, TIBC, IRON, RETICCTPCT in the last 72 hours. Urine analysis:    Component Value Date/Time   COLORURINE YELLOW 10/03/2019 0814   APPEARANCEUR CLEAR 10/03/2019 0814   LABSPEC <1.005 (L) 10/03/2019 0814   PHURINE 5.5 10/03/2019 0814   GLUCOSEU NEGATIVE 10/03/2019 0814   HGBUR NEGATIVE 10/03/2019 0814    BILIRUBINUR NEGATIVE 10/03/2019 0814   KETONESUR 15 (A) 10/03/2019 0814   PROTEINUR NEGATIVE 10/03/2019 0814   UROBILINOGEN 1.0 03/04/2007 1518   NITRITE NEGATIVE 10/03/2019 0814   LEUKOCYTESUR NEGATIVE 10/03/2019 0814   Sepsis Labs: Invalid input(s): PROCALCITONIN, LACTICIDVEN  Microbiology: Recent Results (from the past 240 hour(s))  SARS CORONAVIRUS 2 (TAT 6-24 HRS) Nasopharyngeal Nasopharyngeal Swab     Status: None   Collection Time: 10/09/19  3:55 PM   Specimen: Nasopharyngeal Swab  Result Value Ref Range Status   SARS Coronavirus 2 NEGATIVE NEGATIVE Final    Comment: (NOTE) SARS-CoV-2 target nucleic acids are NOT DETECTED. The SARS-CoV-2 RNA is generally detectable in upper and lower respiratory specimens during the acute phase of infection. Negative results do not preclude SARS-CoV-2 infection, do not rule out co-infections with other pathogens, and should not be used as the sole basis for treatment or other  patient management decisions. Negative results must be combined with clinical observations, patient history, and epidemiological information. The expected result is Negative. Fact Sheet for Patients: SugarRoll.be Fact Sheet for Healthcare Providers: https://www.woods-mathews.com/ This test is not yet approved or cleared by the [place] FDA and  has been authorized for detection and/or diagnosis of SARS-CoV-2 by FDA under an Emergency Use Authorization (EUA). This EUA will remain  in effect (meaning this test can be used) for the duration of the COVID-19 declaration under Section 56 4(b)(1) of the Act, 21 U.S.C. section 360bbb-3(b)(1), unless the authorization is terminated or revoked sooner. Performed at [facility], [place] [address], [place], [place] [address]     Radiology Studies: No results found.    Dyami Umbach T. [place]  If 7PM-7AM, please contact night-coverage [email or web address] Password  [facility] 10/14/2019, 12:49 PM

## 2019-10-14 NOTE — Plan of Care (Signed)
  Problem: Education: Goal: Knowledge of disease or condition will improve Outcome: Progressing Goal: Knowledge of secondary prevention will improve Outcome: Progressing Goal: Knowledge of patient specific risk factors addressed and post discharge goals established will improve Outcome: Progressing Goal: Individualized Educational Video(s) Outcome: Progressing   Problem: Coping: Goal: Will verbalize positive feelings about self Outcome: Progressing Goal: Will identify appropriate support needs Outcome: Progressing   Problem: Health Behavior/Discharge Planning: Goal: Ability to manage health-related needs will improve Outcome: Progressing   Problem: Self-Care: Goal: Ability to participate in self-care as condition permits will improve Outcome: Progressing Goal: Verbalization of feelings and concerns over difficulty with self-care will improve Outcome: Progressing Goal: Ability to communicate needs accurately will improve Outcome: Progressing   Problem: Nutrition: Goal: Risk of aspiration will decrease Outcome: Progressing Goal: Dietary intake will improve Outcome: Progressing   Problem: Ischemic Stroke/TIA Tissue Perfusion: Goal: Complications of ischemic stroke/TIA will be minimized Outcome: Progressing   Problem: Spontaneous Subarachnoid Hemorrhage Tissue Perfusion: Goal: Complications of Spontaneous Subarachnoid Hemorrhage will be minimized Outcome: Progressing   

## 2019-10-14 NOTE — Progress Notes (Signed)
  Speech Language Pathology Treatment: Dysphagia;Cognitive-Linquistic  Patient Details Name: Amy Raymond MRN: [id number] DOB: [birth year] Today's Date: 10/14/2019 Time: 5400-8676 SLP Time Calculation (min) (ACUTE ONLY): 15 min  Assessment / Plan / Recommendation Clinical Impression  Pt seen at bedside for skilled ST. Patient with improved speech intelligibility, remains with mild/mod dysarthria. Patient approximately 65% intelligible at the conversation level. Pt led in exercises to target utilization of over-articulation and slowing rate. Pt able to complete with min fading verbal cues. Patient engaged in tasks to target functional problem solving. Pt able to demonstrate good functional problem solving in current situation (e.g. using call bell, knowing when to use call bell, requesting assistance appropriately). Pt demonstrated some difficulty with higher-level verbal problem solving tasks. Pt will benefit from continued ST to target.   Pt seen with sips of HTL and puree solids. Pt with good recall and carryover of compensatory strategies for very small bites and sips. Pt with no overt s/sx seen with sips of HTL. With bites of puree, patient with immediate throat clearx1 following a bite of applesauce. Otherwise, no overt s/sx aspiration seen. Pt led in effortful swallows to target swallow function. Recommend continuation of dysphagia 1/honey thick liquids. Patient will benefit from repeat MBSS in next week or two. ST to follow as per POC.   HPI HPI: Amy Raymond is a 54 y.o. 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 Patient was discharged on 10/03/19 after second CVA in one month. She does have a residue right side weakness. Daughter noticed that on Friday, pt has had episodes of slurred speech and  today she was home and about 1250 her speech became slurred speech, confusion and dizzyness, also seemed have  some trouble swallowing. MRI reported: "1. Mild enlargement of a pontine infarct since the 10/03/2019 MRI. 2. Numerous new punctate acute infarcts throughout both cerebral and cerebellar hemispheres." Pt completed a BSE on 09/11/19 with recommendations for regular solids and thin liquids.       SLP Plan  Continue with current plan of care       Recommendations  Diet recommendations: Dysphagia 1 (puree);Honey-thick liquid Liquids provided via: Cup;Teaspoon Medication Administration: Crushed with puree Supervision: Intermittent supervision to cue for compensatory strategies;Staff to assist with self feeding Compensations: Slow rate;Small sips/bites;Clear throat intermittently;Multiple dry swallows after each bite/sip Postural Changes and/or Swallow Maneuvers: Seated upright 90 degrees                Oral Care Recommendations: Oral care BID Follow up Recommendations: Inpatient Rehab SLP Visit Diagnosis: Cognitive communication deficit (R41.841);Dysphagia, oropharyngeal phase (R13.12);Dysarthria and anarthria (R47.1) Plan: Continue with current plan of care       GO                Jori Thrall 10/14/2019, 11:40 AM  Shella Spearing, M.Ed., CCC-SLP Speech Therapy Acute Rehabilitation [phone]: Acute Rehab office [phone] - pager

## 2019-10-14 NOTE — Progress Notes (Signed)
Physical Therapy Treatment Patient Details Name: Amy Raymond MRN: [id number] DOB: 13-Jun-1966 Today's Date: 10/14/2019    History of Present Illness  Amy Raymond is a 54 y.o. 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 New punctate B cerebral and cerebellar infarcts in setting of central pontine infarct last week.    PT Comments    Pt performed gt training and functional mobility this session.  Pt sleeping on arrival and balance and coordination appeared more impaired this session.  Opted for RW use for further gt training.  Plan for intensive therapies remain appropriate before returning home.     Follow Up Recommendations  CIR;Supervision/Assistance - 24 hour     Equipment Recommendations  Rolling walker with 5" wheels    Recommendations for Other Services       Precautions / Restrictions Precautions Precautions: Fall Precaution Comments: fell before presenting to ED Restrictions Weight Bearing Restrictions: No    Mobility  Bed Mobility Overal bed mobility: Needs Assistance Bed Mobility: Supine to Sit;Sit to Supine     Supine to sit: Supervision Sit to supine: Supervision   General bed mobility comments: supervision for safety  Transfers Overall transfer level: Needs assistance Equipment used: Rolling walker (2 wheeled) Transfers: Sit to/from [employer] Transfers Sit to Stand: Supervision;Min assist(supervision to rise and min to maintain balance once in standing.)         General transfer comment: Cues for hand placement and safety.  Pt unable to maintain balance once in standing.  She still remains with LOB and poor coordination in standing.  Ambulation/Gait Ambulation/Gait assistance: Min assist;Mod assist Gait Distance (Feet): 80 Feet Assistive device: None;Rolling walker (2 wheeled) Gait Pattern/deviations: Decreased step length - right;Decreased  dorsiflexion - right;Ataxic;Drifts right/left;Staggering right;Narrow base of support Gait velocity: decreased   General Gait Details: Pt continues to present with poor balance in standing, ambulated to and from bathroom without device and she required moderate assistance.  PTA opted for RW use for gt training as buckling noted and poor balance.   Stairs             Wheelchair Mobility    Modified Rankin (Stroke Patients Only)       Balance Overall balance assessment: Needs assistance Sitting-balance support: Feet supported;No upper extremity supported Sitting balance-Leahy Scale: Fair Sitting balance - Comments: posteiror bias     Standing balance-Leahy Scale: Poor Standing balance comment: needs external support in standing.                             Cognition Arousal/Alertness: Awake/alert Behavior During Therapy: Impulsive Overall Cognitive Status: Impaired/Different from baseline Area of Impairment: Attention;Safety/judgement;Awareness;Problem solving                   Current Attention Level: Selective   Following Commands: Follows one step commands consistently Safety/Judgement: Decreased awareness of safety;Decreased awareness of deficits Awareness: Emergent Problem Solving: Slow processing;Requires verbal cues General Comments: cues for controlling impulsivity      Exercises      General Comments        Pertinent Vitals/Pain Pain Assessment: Faces Pain Score: 0-No pain Faces Pain Scale: No hurt    Home Living                      Prior Function  PT Goals (current goals can now be found in the care plan section) Acute Rehab PT Goals Patient Stated Goal: to go to rehab Potential to Achieve Goals: Good Progress towards PT goals: Progressing toward goals    Frequency    Min 4X/week      PT Plan Current plan remains appropriate    Co-evaluation              AM-PAC PT "6 Clicks"  Mobility   Outcome Measure  Help needed turning from your back to your side while in a flat bed without using bedrails?: None Help needed moving from lying on your back to sitting on the side of a flat bed without using bedrails?: None Help needed moving to and from a bed to a chair (including a wheelchair)?: A Little Help needed standing up from a chair using your arms (e.g., wheelchair or bedside chair)?: A Little Help needed to walk in hospital room?: A Little Help needed climbing 3-5 steps with a railing? : A Little 6 Click Score: 20    End of Session Equipment Utilized During Treatment: Gait belt Activity Tolerance: Patient tolerated treatment well Patient left: with call bell/phone within reach;in chair;with chair alarm set;with family/visitor present Nurse Communication: Mobility status PT Visit Diagnosis: Other abnormalities of gait and mobility (R26.89);Ataxic gait (R26.0)     Time: 4481-8563 PT Time Calculation (min) (ACUTE ONLY): 17 min  Charges:  $Gait Training: 8-22 mins                     Erasmo Leventhal , PTA Acute Rehabilitation Services Pager [phone] Office [phone]     Brenna Friesenhahn Eli Hose 10/14/2019, 2:32 PM

## 2019-10-15 LAB — GLUCOSE, CAPILLARY
Glucose-Capillary: 162 mg/dL — ABNORMAL HIGH (ref 70–99)
Glucose-Capillary: 135 mg/dL — ABNORMAL HIGH (ref 70–99)
Glucose-Capillary: 146 mg/dL — ABNORMAL HIGH (ref 70–99)
Glucose-Capillary: 204 mg/dL — ABNORMAL HIGH (ref 70–99)

## 2019-10-15 LAB — CBC
HCT: 31.2 % — ABNORMAL LOW (ref 36.0–46.0)
Hemoglobin: 10 g/dL — ABNORMAL LOW (ref 12.0–15.0)
MCH: 24 pg — ABNORMAL LOW (ref 26.0–34.0)
MCHC: 32.1 g/dL (ref 30.0–36.0)
MCV: 75 fL — ABNORMAL LOW (ref 80.0–100.0)
Platelets: 547 10*3/uL — ABNORMAL HIGH (ref 150–400)
RBC: 4.16 MIL/uL (ref 3.87–5.11)
RDW: 14.1 % (ref 11.5–15.5)
WBC: 8.3 10*3/uL (ref 4.0–10.5)
nRBC: 0 % (ref 0.0–0.2)

## 2019-10-15 LAB — RENAL FUNCTION PANEL
Albumin: 2.8 g/dL — ABNORMAL LOW (ref 3.5–5.0)
Anion gap: 10 (ref 5–15)
BUN: 8 mg/dL (ref 6–20)
CO2: 26 mmol/L (ref 22–32)
Calcium: 9.2 mg/dL (ref 8.9–10.3)
Chloride: 108 mmol/L (ref 98–111)
Creatinine, Ser: 0.65 mg/dL (ref 0.44–1.00)
GFR calc Af Amer: >60 mL/min (ref >60)
GFR calc non Af Amer: >60 mL/min (ref >60)
Glucose, Bld: 161 mg/dL — ABNORMAL HIGH (ref 70–99)
Phosphorus: 4 mg/dL (ref 2.5–4.6)
Potassium: 3.5 mmol/L (ref 3.5–5.1)
Sodium: 144 mmol/L (ref 135–145)

## 2019-10-15 LAB — MAGNESIUM: Magnesium: 1.8 mg/dL (ref 1.7–2.4)

## 2019-10-15 NOTE — Progress Notes (Signed)
PROGRESS NOTE  Amy Raymond:[id number] DOB: May 06, 1966   PCP: [place], Julie M, NP  Patient is from: Home  DOA: 10/09/2019 LOS: 6  Brief Narrative / Interim history: 54 year old female with history of CVA, HTN, HLD and DM-2 presenting with slurred speech and found to have new punctate bilateral cerebral and cerebellar infarct on MRI.  Imaging also revealed moderate right ICA stenosis and moderate to severe left ICA stenosis.  Neurology recommended DAPT with aspirin 325 mg daily and Plavix 75 mg daily for 3 months followed by aspirin alone.  Evaluated by physical and Occupational Therapy who recommended CIR. However, patient's insurance is not in network with [facility] inpatient rehab.  Waiting on insurance authorization for inpatient rehab at [facility].  Subjective: No major events overnight of this morning.  No complaint this morning.  Denies headache, vision change, chest pain or new focal neuro symptoms.  Objective: Vitals:   10/14/19 2309 10/15/19 0311 10/15/19 0728 10/15/19 1141  BP: 112/66 103/66 (!) 140/58 133/70  Pulse: 66 66 (!) 58 64  Resp: 18 16 18 18   Temp: 98.3 F (36.8 C) 98.1 F (36.7 C) 99 F (37.2 C) 98.2 F (36.8 C)  TempSrc: Oral Oral Oral Oral  SpO2: 99% 99% 99% 97%  Weight:      Height:       No intake or output data in the 24 hours ending 10/15/19 1248 Filed Weights   10/09/19 1651  Weight: 68.4 kg    Examination:   GENERAL: No acute distress.  Appears well.  HEENT: MMM.  Vision and hearing grossly intact.  NECK: Supple.  No apparent JVD.  RESP:  No IWOB. Good air movement bilaterally. CVS:  RRR. Heart sounds normal.  ABD/GI/GU: Bowel sounds present. Soft. Non tender.  MSK/EXT:  Moves extremities. No apparent deformity. No edema.  SKIN: no apparent skin lesion or wound NEURO: Awake, alert and oriented appropriately.   Slurred speech.  Right facial droop.  Right pronator drift.  Finger-to-nose off on the right.  Motor 4/5 in RUE and RLE.   5/5 in LUE and LLE. PSYCH: Calm. Normal affect.  Procedures:  None  Assessment & Plan: Acute punctate bilateral cerebral and cerebellar infarcts Moderate right ICA stenosis and moderate to severe left ICA stenosis -Neuro-DAPT with full dose aspirin and Plavix for 3 months followed by aspirin alone -Waiting on insurance authorization for inpatient rehab [facility]. Not in network with CIR. -Outside window for permissive hypertension  Essential hypertension: Normotensive. -Outside the window for permissive hypertension  Uncontrolled DM-2 with hyperglycemia: A1c 8.7%. Recent Labs    10/14/19 2103 10/15/19 0615 10/15/19 1145  GLUCAP 146* 162* 135*  -Continue current regimen with statin  Hypothyroidism -Continue home Synthroid  Hyperlipidemia: LDL 117. -Continue Lipitor 80 mg daily                 DVT prophylaxis: Subcu heparin Code Status: Full code Family Communication: Patient and/or RN. Available if any question.  Discharge barrier: Safe disposition which would be inpatient rehab.  Waiting on insurance authorization. Patient is from: Home Final disposition: Inpatient rehab at [facility] on 10/17/2019  Consultants: Neurology (off)   Microbiology summarized: 2/28-COVID-19 negative  Sch Meds:  Scheduled Meds: . aspirin  325 mg Oral Daily  . atorvastatin  80 mg Oral q1800  . cholecalciferol  1,000 Units Oral Daily  . clopidogrel  75 mg Oral Daily  . ferrous sulfate  325 mg Oral Q breakfast  .  heparin  5,000 Units Subcutaneous Q8H  . insulin aspart  0-9 Units Subcutaneous TID WC  . levothyroxine  100 mcg Oral QAC breakfast  . sodium chloride flush  3 mL Intravenous Once   Continuous Infusions: PRN Meds:.acetaminophen **OR** acetaminophen (TYLENOL) oral liquid 160 mg/5 mL **OR** acetaminophen, labetalol, Resource ThickenUp Clear, senna-docusate, white petrolatum  Antimicrobials: Anti-infectives (From admission, onward)    None       I have personally reviewed the following labs and images: CBC: Recent Labs  Lab 10/09/19 1337 10/09/19 1633 10/11/19 0902 10/15/19 0502  WBC  --  11.0* 9.4 8.3  NEUTROABS  --  7.7 5.9  --   HGB 10.9* 10.2* 9.3* 10.0*  HCT 32.0* 32.9* 29.6* 31.2*  MCV  --  76.3* 74.4* 75.0*  PLT  --  582* 533* 547*   BMP &GFR Recent Labs  Lab 10/09/19 1337 10/09/19 1357 10/10/19 0500 10/11/19 1517 10/15/19 0502  NA 144 144 143 141 144  K 3.4* 3.5 3.4* 3.2* 3.5  CL 108 108 109 107 108  CO2  --  21* 21* 23 26  GLUCOSE 101* 99 105* 207* 161*  BUN 10 11 9 7 8   CREATININE 0.50 0.75 0.62 0.61 0.65  CALCIUM  --  9.5 9.0 9.2 9.2  MG  --   --  1.8  --  1.8  PHOS  --   --   --   --  4.0   Estimated Creatinine Clearance: 75.5 mL/min (by C-G formula based on SCr of 0.65 mg/dL). Liver & Pancreas: Recent Labs  Lab 10/09/19 1357 10/15/19 0502  AST 30  --   ALT 29  --   ALKPHOS 131*  --   BILITOT 1.0  --   PROT 7.4  --   ALBUMIN 3.3* 2.8*   No results for input(s): LIPASE, AMYLASE in the last 168 hours. No results for input(s): AMMONIA in the last 168 hours. Diabetic: No results for input(s): HGBA1C in the last 72 hours. Recent Labs  Lab 10/14/19 1132 10/14/19 1513 10/14/19 2103 10/15/19 0615 10/15/19 1145  GLUCAP 173* 191* 146* 162* 135*   Cardiac Enzymes: No results for input(s): CKTOTAL, CKMB, CKMBINDEX, TROPONINI in the last 168 hours. No results for input(s): PROBNP in the last 8760 hours. Coagulation Profile: Recent Labs  Lab 10/09/19 1357  INR 1.1   Thyroid Function Tests: No results for input(s): TSH, T4TOTAL, FREET4, T3FREE, THYROIDAB in the last 72 hours. Lipid Profile: No results for input(s): CHOL, HDL, LDLCALC, TRIG, CHOLHDL, LDLDIRECT in the last 72 hours. Anemia Panel: No results for input(s): VITAMINB12, FOLATE, FERRITIN, TIBC, IRON, RETICCTPCT in the last 72 hours. Urine analysis:    Component Value Date/Time   COLORURINE YELLOW  10/03/2019 0814   APPEARANCEUR CLEAR 10/03/2019 0814   LABSPEC <1.005 (L) 10/03/2019 0814   PHURINE 5.5 10/03/2019 [place] 10/03/2019 0814   HGBUR NEGATIVE 10/03/2019 0814   BILIRUBINUR NEGATIVE 10/03/2019 0814   KETONESUR 15 (A) 10/03/2019 0814   PROTEINUR NEGATIVE 10/03/2019 0814   UROBILINOGEN 1.0 03/04/2007 1518   NITRITE NEGATIVE 10/03/2019 0814   LEUKOCYTESUR NEGATIVE 10/03/2019 0814   Sepsis Labs: Invalid input(s): PROCALCITONIN, [place]  Microbiology: Recent Results (from the past 240 hour(s))  SARS CORONAVIRUS 2 (TAT 6-24 HRS) Nasopharyngeal Nasopharyngeal Swab     Status: None   Collection Time: 10/09/19  3:55 PM   Specimen: Nasopharyngeal Swab  Result Value Ref Range Status   SARS Coronavirus 2 NEGATIVE NEGATIVE Final  Comment: (NOTE) SARS-CoV-2 target nucleic acids are NOT DETECTED. The SARS-CoV-2 RNA is generally detectable in upper and lower respiratory specimens during the acute phase of infection. Negative results do not preclude SARS-CoV-2 infection, do not rule out co-infections with other pathogens, and should not be used as the sole basis for treatment or other patient management decisions. Negative results must be combined with clinical observations, patient history, and epidemiological information. The expected result is Negative. Fact Sheet for Patients: HairSlick.no Fact Sheet for Healthcare Providers: quierodirigir.com This test is not yet approved or cleared by the [place] FDA and  has been authorized for detection and/or diagnosis of SARS-CoV-2 by FDA under an Emergency Use Authorization (EUA). This EUA will remain  in effect (meaning this test can be used) for the duration of the COVID-19 declaration under Section 56 4(b)(1) of the Act, 21 U.S.C. section 360bbb-3(b)(1), unless the authorization is terminated or revoked sooner. Performed at [facility],  [address], [place], [place] [address]     Radiology Studies: No results found.    Georgeann Brinkman T. Rodneshia Greenhouse [facility]  If 7PM-7AM, please contact night-coverage [email or web address] Password [facility] 10/15/2019, 12:48 PM

## 2019-10-16 LAB — GLUCOSE, CAPILLARY
Glucose-Capillary: 156 mg/dL — ABNORMAL HIGH (ref 70–99)
Glucose-Capillary: 167 mg/dL — ABNORMAL HIGH (ref 70–99)
Glucose-Capillary: 184 mg/dL — ABNORMAL HIGH (ref 70–99)
Glucose-Capillary: 144 mg/dL — ABNORMAL HIGH (ref 70–99)

## 2019-10-16 MED ORDER — INSULIN ASPART 100 UNIT/ML ~~LOC~~ SOLN
0.0000 [IU] | Freq: Three times a day (TID) | SUBCUTANEOUS | Status: DC
  Administered 2019-10-16 – 2019-10-17 (×2): 2 [IU] via SUBCUTANEOUS

## 2019-10-16 MED ORDER — INSULIN ASPART 100 UNIT/ML ~~LOC~~ SOLN: 0.0000 [IU] | Freq: Every day | SUBCUTANEOUS | Status: DC

## 2019-10-16 NOTE — Progress Notes (Signed)
PROGRESS NOTE  Amy Raymond BTD:[id number] DOB: 1966-01-12   PCP: [place], Julie M, NP  Patient is from: Home  DOA: 10/09/2019 LOS: 7  Brief Narrative / Interim history: 54 year old female with history of CVA, HTN, HLD and DM-2 presenting with slurred speech and found to have new punctate bilateral cerebral and cerebellar infarct on MRI.  Imaging also revealed moderate right ICA stenosis and moderate to severe left ICA stenosis.  Neurology recommended DAPT with aspirin 325 mg daily and Plavix 75 mg daily for 3 months followed by aspirin alone.  Evaluated by physical and Occupational Therapy who recommended CIR. However, patient's insurance is not in network with [facility] inpatient rehab.  Waiting on insurance authorization for inpatient rehab at [facility].  Subjective: No major events overnight of this morning.  No complaint this morning.  Objective: Vitals:   10/15/19 2346 10/16/19 0414 10/16/19 0746 10/16/19 1152  BP: 110/64 137/69 (!) 141/67 135/74  Pulse: 63 (!) 54 (!) 59 73  Resp: 17 19 20 18   Temp: 98.2 F (36.8 C) 98.3 F (36.8 C) 98.4 F (36.9 C) 98.2 F (36.8 C)  TempSrc: Oral Oral Oral Oral  SpO2: 97% 100% 100%   Weight:      Height:        Intake/Output Summary (Last 24 hours) at 10/16/2019 1420 Last data filed at 10/16/2019 0807 Gross per 24 hour  Intake 200 ml  Output --  Net 200 ml   Filed Weights   10/09/19 1651  Weight: 68.4 kg    Examination:   GENERAL: No acute distress.  Appears well.  HEENT: MMM.  Vision and hearing grossly intact.  NECK: Supple.  No apparent JVD.  RESP:  No IWOB. Good air movement bilaterally. CVS:  RRR. Heart sounds normal.  ABD/GI/GU: Bowel sounds present. Soft. Non tender.  MSK/EXT:  Moves extremities. No apparent deformity. No edema.  SKIN: no apparent skin lesion or wound NEURO: Awake, alert and oriented appropriately. Slurred speech.  Right facial droop.  Right pronator drift.  Finger-to-nose off on the right.  Motor  4/5 in RUE and RLE.  5/5 in LUE and LLE. PSYCH: Calm. Normal affect.  Procedures:  None  Assessment & Plan: Acute punctate bilateral cerebral and cerebellar infarcts Moderate right ICA stenosis and moderate to severe left ICA stenosis -Neuro-DAPT with full dose aspirin and Plavix for 3 months followed by aspirin alone -Waiting on insurance authorization for inpatient rehab at [facility]. Not in network with CIR. -Outside window for permissive hypertension  Essential hypertension: Normotensive. -Outside the window for permissive hypertension  Uncontrolled DM-2 with hyperglycemia: A1c 8.7%. Recent Labs    10/15/19 2117 10/16/19 0614 10/16/19 1119  GLUCAP 204* 156* 167*  -Continue SSI-thin. Add nightly coverage  Hypothyroidism -Continue home Synthroid  Hyperlipidemia: LDL 117. -Continue Lipitor 80 mg daily                 DVT prophylaxis: Subcu heparin Code Status: Full code Family Communication: Patient and/or RN. Available if any question.  Discharge barrier: Safe disposition which would be inpatient rehab.  Waiting on insurance authorization. Patient is from: Home Final disposition: Inpatient rehab at [facility] on 10/17/2019  Consultants: Neurology (off)   Microbiology summarized: 2/28-COVID-19 negative  Sch Meds:  Scheduled Meds: . aspirin  325 mg Oral Daily  . atorvastatin  80 mg Oral q1800  . cholecalciferol  1,000 Units Oral Daily  . clopidogrel  75 mg Oral Daily  . ferrous sulfate  325  mg Oral Q breakfast  . heparin  5,000 Units Subcutaneous Q8H  . insulin aspart  0-9 Units Subcutaneous TID WC  . levothyroxine  100 mcg Oral QAC breakfast  . sodium chloride flush  3 mL Intravenous Once   Continuous Infusions: PRN Meds:.acetaminophen **OR** acetaminophen (TYLENOL) oral liquid 160 mg/5 mL **OR** acetaminophen, labetalol, Resource ThickenUp Clear, senna-docusate, white petrolatum  Antimicrobials: Anti-infectives (From  admission, onward)   None       I have personally reviewed the following labs and images: CBC: Recent Labs  Lab 10/09/19 1633 10/11/19 0902 10/15/19 0502  WBC 11.0* 9.4 8.3  NEUTROABS 7.7 5.9  --   HGB 10.2* 9.3* 10.0*  HCT 32.9* 29.6* 31.2*  MCV 76.3* 74.4* 75.0*  PLT 582* 533* 547*   BMP &GFR Recent Labs  Lab 10/10/19 0500 10/11/19 1517 10/15/19 0502  NA 143 141 144  K 3.4* 3.2* 3.5  CL 109 107 108  CO2 21* 23 26  GLUCOSE 105* 207* 161*  BUN 9 7 8   CREATININE 0.62 0.61 0.65  CALCIUM 9.0 9.2 9.2  MG 1.8  --  1.8  PHOS  --   --  4.0   Estimated Creatinine Clearance: 75.5 mL/min (by C-G formula based on SCr of 0.65 mg/dL). Liver & Pancreas: Recent Labs  Lab 10/15/19 0502  ALBUMIN 2.8*   No results for input(s): LIPASE, AMYLASE in the last 168 hours. No results for input(s): AMMONIA in the last 168 hours. Diabetic: No results for input(s): HGBA1C in the last 72 hours. Recent Labs  Lab 10/15/19 1145 10/15/19 1519 10/15/19 2117 10/16/19 0614 10/16/19 1119  GLUCAP 135* 146* 204* 156* 167*   Cardiac Enzymes: No results for input(s): CKTOTAL, CKMB, CKMBINDEX, TROPONINI in the last 168 hours. No results for input(s): PROBNP in the last 8760 hours. Coagulation Profile: No results for input(s): INR, PROTIME in the last 168 hours. Thyroid Function Tests: No results for input(s): TSH, T4TOTAL, FREET4, T3FREE, THYROIDAB in the last 72 hours. Lipid Profile: No results for input(s): CHOL, HDL, LDLCALC, TRIG, CHOLHDL, LDLDIRECT in the last 72 hours. Anemia Panel: No results for input(s): VITAMINB12, FOLATE, FERRITIN, TIBC, IRON, RETICCTPCT in the last 72 hours. Urine analysis:    Component Value Date/Time   COLORURINE YELLOW 10/03/2019 0814   APPEARANCEUR CLEAR 10/03/2019 0814   LABSPEC <1.005 (L) 10/03/2019 0814   PHURINE 5.5 10/03/2019 0814   GLUCOSEU NEGATIVE 10/03/2019 0814   HGBUR NEGATIVE 10/03/2019 0814   BILIRUBINUR NEGATIVE 10/03/2019 0814    KETONESUR 15 (A) 10/03/2019 0814   PROTEINUR NEGATIVE 10/03/2019 0814   UROBILINOGEN 1.0 03/04/2007 1518   NITRITE NEGATIVE 10/03/2019 0814   LEUKOCYTESUR NEGATIVE 10/03/2019 0814   Sepsis Labs: Invalid input(s): PROCALCITONIN, LACTICIDVEN  Microbiology: Recent Results (from the past 240 hour(s))  SARS CORONAVIRUS 2 (TAT 6-24 HRS) Nasopharyngeal Nasopharyngeal Swab     Status: None   Collection Time: 10/09/19  3:55 PM   Specimen: Nasopharyngeal Swab  Result Value Ref Range Status   SARS Coronavirus 2 NEGATIVE NEGATIVE Final    Comment: (NOTE) SARS-CoV-2 target nucleic acids are NOT DETECTED. The SARS-CoV-2 RNA is generally detectable in upper and lower respiratory specimens during the acute phase of infection. Negative results do not preclude SARS-CoV-2 infection, do not rule out co-infections with other pathogens, and should not be used as the sole basis for treatment or other patient management decisions. Negative results must be combined with clinical observations, patient history, and epidemiological information. The expected result is Negative. Fact Sheet  for Patients: SugarRoll.be Fact Sheet for Healthcare Providers: https://www.woods-mathews.com/ This test is not yet approved or cleared by the [place] FDA and  has been authorized for detection and/or diagnosis of SARS-CoV-2 by FDA under an Emergency Use Authorization (EUA). This EUA will remain  in effect (meaning this test can be used) for the duration of the COVID-19 declaration under Section 56 4(b)(1) of the Act, 21 U.S.C. section 360bbb-3(b)(1), unless the authorization is terminated or revoked sooner. Performed at [facility], [place] [address], [place], [place] [address]     Radiology Studies: No results found.    Arleigh Dicola T. [place]  If 7PM-7AM, please contact night-coverage [email or web address] Password [email or web address] 10/16/2019, 2:20 PM

## 2019-10-17 DIAGNOSIS — E039 Hypothyroidism, unspecified: Secondary | ICD-10-CM

## 2019-10-17 DIAGNOSIS — I6522 Occlusion and stenosis of left carotid artery: Secondary | ICD-10-CM

## 2019-10-17 LAB — GLUCOSE, CAPILLARY
Glucose-Capillary: 161 mg/dL — ABNORMAL HIGH (ref 70–99)
Glucose-Capillary: 160 mg/dL — ABNORMAL HIGH (ref 70–99)

## 2019-10-17 LAB — SARS CORONAVIRUS 2 (TAT 6-24 HRS): SARS Coronavirus 2: NEGATIVE

## 2019-10-17 MED ORDER — SENNOSIDES-DOCUSATE SODIUM 8.6-50 MG PO TABS: 1.0000 | ORAL_TABLET | Freq: Every evening | ORAL | Status: AC | PRN

## 2019-10-17 MED ORDER — ASPIRIN 325 MG PO TBEC: 325.0000 mg | DELAYED_RELEASE_TABLET | Freq: Every day | ORAL | 3 refills | Status: AC

## 2019-10-17 MED ORDER — CLOPIDOGREL BISULFATE 75 MG PO TABS: 75.0000 mg | ORAL_TABLET | Freq: Every day | ORAL | 2 refills | Status: AC

## 2019-10-17 NOTE — Progress Notes (Signed)
Discharge Nursing Note  Primary RN called report to [facility], spoke with Zack Seal RN at facility. All of patients personal belongings accounted for, telemetry and PIV removed. Patient dressed with staff assistance and mother updated at bedside about transfer to facility. [facility] arrived at 1230 to transport patient.

## 2019-10-17 NOTE — Discharge Summary (Signed)
Physician Discharge Summary  Amy Raymond YQI:[id number] DOB: 04/25/1966 DOA: 10/09/2019  PCP: Martinique, Julie M, NP  Admit date: 10/09/2019 Discharge date: 10/17/2019  Admitted From: Home Disposition: [facility]  Recommendations for Outpatient Follow-up:  1. Follow ups as below. 2. Please obtain CBC/BMP/Mag at follow up 3. Please follow up on the following pending results: None  Discharge Condition: Stable CODE STATUS: Full code  Follow-up Information    [facility]. Schedule an appointment as soon as possible for a visit in 4 week(s).   Specialty: Neurology Contact information: [place] [facility] Course: 54 year old female with history of CVA, HTN, HLD and DM-2 presenting with slurred speech and found to have new punctate bilateral cerebral and cerebellar infarct on MRI.  Imaging also revealed moderate right ICA stenosis and moderate to severe left ICA stenosis.    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 Patient is to remain on best medical management with no stenting indicated at this time.neurology recommended DAPT with aspirin 325 mg daily and Plavix 75 mg daily for 3 months followed by aspirin alone.  Patient was evaluated by physical and Occupational Therapy who recommended CIR. However, patient's insurance is not in network with [facility] inpatient rehab.  She will be transferred to [facility] inpatient rehab  See individual problem list below for more hospital course.  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 -Neuro recommended DAPT with full dose aspirin and Plavix for 3 months starting 10/05/2019 followed by full dose aspirin alone.  She will be on high intensity statin as well. -Continue inpatient rehab therapy -Outside window for permissive hypertension -Outpatient follow-up with neurology in 4 to 6 weeks  Essential hypertension: Normotensive for most part. -Outside the window for permissive hypertension -Continue low-dose lisinopril and adjust as appropriate  Uncontrolled DM-2 with hyperglycemia: A1c 8.7%. Recent Labs    10/16/19 1544 10/16/19 2123 10/17/19 0613  GLUCAP 184* 144* 161*  -Continue home glipizide and Jentadueto -Continue high intensity statin  Hypothyroidism: TSH within normal range. -Continue home Synthroid  Hyperlipidemia: LDL 117. -Continue Lipitor 80 mg daily   Discharge Instructions  Discharge Instructions    Diet - low sodium heart healthy   Complete by: As directed    Diet Carb Modified   Complete by: As directed      Allergies as of 10/17/2019   No Known Allergies     Medication List    TAKE these medications   acetaminophen 500 MG tablet Commonly known as: TYLENOL Take 500-1,000 mg by mouth every 6 (six) hours as needed for mild pain or headache.   aspirin 325 MG EC tablet Take 1 tablet (325 mg total) by mouth daily.   atorvastatin 80 MG tablet Commonly known as: LIPITOR Take 1 tablet (80 mg total) by mouth daily at 6 PM. What changed: Another medication with the same name was removed. Continue taking this medication, and follow the directions you see here.   cholecalciferol 25 MCG (1000 UNIT) tablet Commonly known  as: VITAMIN D3 Take 1,000 Units by mouth daily.   clopidogrel 75 MG tablet Commonly known as: PLAVIX Take 1 tablet (75 mg total) by mouth daily.   ferrous sulfate 325 (65 FE) MG tablet Take  325 mg by mouth daily with breakfast.   glipiZIDE 10 MG tablet Commonly known as: GLUCOTROL Take 10 mg by mouth 2 (two) times daily.   Jentadueto 2.12-998 MG Tabs Generic drug: linaGLIPtin-metFORMIN HCl Take 1 tablet by mouth 2 (two) times daily.   levothyroxine 100 MCG tablet Commonly known as: SYNTHROID Take 1 tablet (100 mcg total) by mouth daily before breakfast. What changed: Another medication with the same name was removed. Continue taking this medication, and follow the directions you see here.   lisinopril 10 MG tablet Commonly known as: ZESTRIL Take 10 mg by mouth daily.   ondansetron 4 MG tablet Commonly known as: ZOFRAN Take 1 tablet (4 mg total) by mouth every 6 (six) hours as needed for nausea.   senna-docusate 8.6-50 MG tablet Commonly known as: Senokot-S Take 1 tablet by mouth at bedtime as needed for mild constipation.       Consultations:  Neurology  Procedures/Studies:  2D Echo on 10/03/2019 1. Left ventricular ejection fraction, by estimation, is 60 to 65%. The  left ventricle has normal function. The left ventricle has no regional  wall motion abnormalities. Left ventricular diastolic function could not  be evaluated.  2. Right ventricular systolic function is normal. The right ventricular  size is normal. There is normal pulmonary artery systolic pressure.  3. No left atrial/left atrial appendage thrombus was detected.  4. The mitral valve is normal in structure and function. Trivial mitral  valve regurgitation. No evidence of mitral stenosis.  5. The aortic valve is tricuspid. Aortic valve regurgitation is trivial.  No aortic stenosis is present.  6. The inferior vena cava is normal in size with greater than 50%  respiratory variability, suggesting right atrial pressure of 3 mmHg.  7. Agitated saline contrast bubble study was negative, with no evidence  of any interatrial shunt.    TEE on 10/04/2019 1. Left ventricular ejection  fraction, by estimation, is 60 to 65%. The  left ventricle has normal function. The left ventricle has no regional  wall motion abnormalities. Left ventricular diastolic function could not  be evaluated.  2. Right ventricular systolic function is normal. The right ventricular  size is normal. There is normal pulmonary artery systolic pressure.  3. No left atrial/left atrial appendage thrombus was detected.  4. The mitral valve is normal in structure and function. Trivial mitral  valve regurgitation. No evidence of mitral stenosis.  5. The aortic valve is tricuspid. Aortic valve regurgitation is trivial.  No aortic stenosis is present.  6. The inferior vena cava is normal in size with greater than 50%  respiratory variability, suggesting right atrial pressure of 3 mmHg.  7. Agitated saline contrast bubble study was negative, with no evidence  of any interatrial shunt.   IR angio of intracranial and extracranial vessels 2/24  IMPRESSION: 1. 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 2. 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 3. 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  PLAN: Case discussed with Dr. Erlinda Hong immediately after the angiogram. Patient is to remain on best medical management with no stenting indicated at this time. CT Angio Head W or Wo Contrast  Result Date: 10/03/2019 CLINICAL DATA:  Found unresponsive. EXAM: CT ANGIOGRAPHY HEAD AND  NECK TECHNIQUE: Multidetector CT imaging of the head and neck was performed using the standard protocol during bolus administration of intravenous contrast. Multiplanar CT image reconstructions and MIPs were obtained to evaluate the vascular anatomy. Carotid stenosis measurements (when applicable) are obtained utilizing NASCET criteria, using the distal internal carotid diameter as the denominator. CONTRAST:  66m OMNIPAQUE IOHEXOL 350 MG/ML SOLN COMPARISON:  09/10/2019 FINDINGS: CT  HEAD FINDINGS Brain: Low-density in the ventral midline pons, new. No acute hemorrhage, hydrocephalus, or masslike finding. Vascular: See below Skull: Negative Sinuses: Clear Orbits: Negative Review of the MIP images confirms the above findings CTA NECK FINDINGS Aortic arch: 2 vessel arch.  No acute finding Right carotid system: Atheromatous wall thickening of the common carotid and likely of the bulb. No flow limiting stenosis or ulceration. Left carotid system: Low-density atheromatous wall thickening of the common carotid and likely of the bulb. No ulceration flow limiting stenosis Vertebral arteries: No proximal subclavian stenosis. The non dominant left vertebral artery shows thready flow with no meaningful patency at the level of the dura. Right vertebral artery is smoothly contoured and patent, partially obscured by intravenous contrast at the V1 segment. Skeleton: Cervical disc degeneration. Other neck: No acute or aggressive finding. Upper chest: Negative Review of the MIP images confirms the above findings CTA HEAD FINDINGS Anterior circulation: Atherosclerotic plaque at the carotid siphons with left more than right stenosis. At least 75% narrowing is seen at the posterior and anterior genu on the left. On the right at the posterior genu there is 60% stenosis. No branch occlusion or beading. Negative for aneurysm. Posterior circulation: No meaningful flow in the left vertebral artery, chronic. The right V4 segment is diffusely patent. Chronic moderate narrowing of the basilar distally. No branch occlusion or beading. Negative for aneurysm Venous sinuses: Patent Anatomic variants: Negative Review of the MIP images confirms the above findings IMPRESSION: 1. Low-density in the ventral pons that is new from January 2021. History suggests acute infarct but this would have an atypical central location. Recommend brain MRI 2. No emergent vascular finding. 3. Chronic moderate basilar stenosis and thready flow in  the non dominant left vertebral artery. 4. Severe left cavernous ICA stenosis. 60% narrowing of the right cavernous ICA. Electronically Signed   By: JMonte FantasiaM.D.   On: 10/03/2019 06:12   CT Angio Neck W and/or Wo Contrast  Result Date: 10/03/2019 CLINICAL DATA:  Found unresponsive. EXAM: CT ANGIOGRAPHY HEAD AND NECK TECHNIQUE: Multidetector CT imaging of the head and neck was performed using the standard protocol during bolus administration of intravenous contrast. Multiplanar CT image reconstructions and MIPs were obtained to evaluate the vascular anatomy. Carotid stenosis measurements (when applicable) are obtained utilizing NASCET criteria, using the distal internal carotid diameter as the denominator. CONTRAST:  774mOMNIPAQUE IOHEXOL 350 MG/ML SOLN COMPARISON:  09/10/2019 FINDINGS: CT HEAD FINDINGS Brain: Low-density in the ventral midline pons, new. No acute hemorrhage, hydrocephalus, or masslike finding. Vascular: See below Skull: Negative Sinuses: Clear Orbits: Negative Review of the MIP images confirms the above findings CTA NECK FINDINGS Aortic arch: 2 vessel arch.  No acute finding Right carotid system: Atheromatous wall thickening of the common carotid and likely of the bulb. No flow limiting stenosis or ulceration. Left carotid system: Low-density atheromatous wall thickening of the common carotid and likely of the bulb. No ulceration flow limiting stenosis Vertebral arteries: No proximal subclavian stenosis. The non dominant left vertebral artery shows thready flow with no meaningful patency at the level of the  dura. Right vertebral artery is smoothly contoured and patent, partially obscured by intravenous contrast at the V1 segment. Skeleton: Cervical disc degeneration. Other neck: No acute or aggressive finding. Upper chest: Negative Review of the MIP images confirms the above findings CTA HEAD FINDINGS Anterior circulation: Atherosclerotic plaque at the carotid siphons with left more  than right stenosis. At least 75% narrowing is seen at the posterior and anterior genu on the left. On the right at the posterior genu there is 60% stenosis. No branch occlusion or beading. Negative for aneurysm. Posterior circulation: No meaningful flow in the left vertebral artery, chronic. The right V4 segment is diffusely patent. Chronic moderate narrowing of the basilar distally. No branch occlusion or beading. Negative for aneurysm Venous sinuses: Patent Anatomic variants: Negative Review of the MIP images confirms the above findings IMPRESSION: 1. Low-density in the ventral pons that is new from January 2021. History suggests acute infarct but this would have an atypical central location. Recommend brain MRI 2. No emergent vascular finding. 3. 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 4. Severe left cavernous ICA stenosis. 60% narrowing of the right cavernous ICA. Electronically Signed   By: Monte Fantasia M.D.   On: 10/03/2019 06:12   MR ANGIO HEAD WO CONTRAST  Result Date: 10/09/2019 CLINICAL DATA:  Worsening slurred speech. Recent pontine infarct. EXAM: MRI HEAD WITHOUT CONTRAST MRA HEAD WITHOUT CONTRAST TECHNIQUE: Multiplanar, multiecho pulse sequences of the brain and surrounding structures were obtained without intravenous contrast. Angiographic images of the head were obtained using MRA technique without contrast. COMPARISON:  Head CT 10/09/2019, MRI 10/03/2019, and CTA 10/03/2019. Cerebral angiogram 10/05/2019. FINDINGS: MRI HEAD FINDINGS An axial T1 sequence was not performed. Brain: An acute infarct involving the central and left paracentral pons has mildly enlarged from the prior MRI. There are new single punctate acute infarcts in each cerebellar hemisphere, and there are numerous new punctate acute infarcts scattered throughout both cerebral hemispheres involving cortex and white matter of multiple different vascular distributions as well  as involving the left thalamus. There is an unchanged single chronic microhemorrhage in the right temporal lobe. No mass, midline shift, or extra-axial fluid collection is identified. A background of mild chronic small vessel ischemia is again noted in the cerebral white matter. The ventricles and sulci are within normal limits for age. Vascular: Unchanged abnormal appearance of the distal left vertebral artery. Skull and upper cervical spine: Unremarkable bone marrow signal. Sinuses/Orbits: Unremarkable orbits. Paranasal sinuses and mastoid air cells are clear. Other: None. MRA HEAD FINDINGS The visualized distal right vertebral artery is widely patent and supplies the basilar. The proximal left V4 segment remains occluded. There is retrograde opacification of the distal left V4 segment supplying the left PICA. Patent AICAs and SCAs are seen bilaterally. The basilar artery is patent with a mild stenosis of its midportion as previously seen. There is a moderate-sized right posterior communicating artery. Both PCAs are patent without evidence of significant proximal stenosis. The internal carotid arteries are patent from skull base to carotid termini with similar appearance of bilateral ICA stenoses near the posterior genu, moderate to severe on the left and mild-to-moderate on the right. An infundibulum is noted at the left posterior communicating origin. ACAs and MCAs are patent without evidence of proximal branch occlusion or significant proximal stenosis. No aneurysm is identified. IMPRESSION: 1. Mild enlargement of a pontine infarct since the 10/03/2019 MRI. 2. Numerous new punctate acute infarcts throughout both cerebral and cerebellar  hemispheres. 3. Unchanged intracranial atherosclerosis including mild to moderate right and moderate to severe left ICA stenoses and a mild mid basilar artery stenosis. 4. 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 Electronically Signed   By: Logan Bores M.D.   On:  10/09/2019 15:21   MR BRAIN WO CONTRAST  Result Date: 10/09/2019 CLINICAL DATA:  Worsening slurred speech. Recent pontine infarct. EXAM: MRI HEAD WITHOUT CONTRAST MRA HEAD WITHOUT CONTRAST TECHNIQUE: Multiplanar, multiecho pulse sequences of the brain and surrounding structures were obtained without intravenous contrast. Angiographic images of the head were obtained using MRA technique without contrast. COMPARISON:  Head CT 10/09/2019, MRI 10/03/2019, and CTA 10/03/2019. Cerebral angiogram 10/05/2019. FINDINGS: MRI HEAD FINDINGS An axial T1 sequence was not performed. Brain: An acute infarct involving the central and left paracentral pons has mildly enlarged from the prior MRI. There are new single punctate acute infarcts in each cerebellar hemisphere, and there are numerous new punctate acute infarcts scattered throughout both cerebral hemispheres involving cortex and white matter of multiple different vascular distributions as well as involving the left thalamus. There is an unchanged single chronic microhemorrhage in the right temporal lobe. No mass, midline shift, or extra-axial fluid collection is identified. A background of mild chronic small vessel ischemia is again noted in the cerebral white matter. The ventricles and sulci are within normal limits for age. Vascular: Unchanged abnormal appearance of the distal left vertebral artery. Skull and upper cervical spine: Unremarkable bone marrow signal. Sinuses/Orbits: Unremarkable orbits. Paranasal sinuses and mastoid air cells are clear. Other: None. MRA HEAD FINDINGS The visualized distal right vertebral artery is widely patent and supplies the basilar. The proximal left V4 segment remains occluded. There is retrograde opacification of the distal left V4 segment supplying the left PICA. Patent AICAs and SCAs are seen bilaterally. The basilar artery is patent with a mild stenosis of its midportion as previously seen. There is a moderate-sized right posterior  communicating artery. Both PCAs are patent without evidence of significant proximal stenosis. The internal carotid arteries are patent from skull base to carotid termini with similar appearance of bilateral ICA stenoses near the posterior genu, moderate to severe on the left and mild-to-moderate on the right. An infundibulum is noted at the left posterior communicating origin. ACAs and MCAs are patent without evidence of proximal branch occlusion or significant proximal stenosis. No aneurysm is identified. IMPRESSION: 1. Mild enlargement of a pontine infarct since the 10/03/2019 MRI. 2. Numerous new punctate acute infarcts throughout both cerebral and cerebellar hemispheres. 3. Unchanged intracranial atherosclerosis including mild to moderate right and moderate to severe left ICA stenoses and a mild mid basilar artery stenosis. 4. 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 Electronically Signed   By: Logan Bores M.D.   On: 10/09/2019 15:21   MR BRAIN WO CONTRAST  Result Date: 10/03/2019 CLINICAL DATA:  Recurrent falls. Weakness. Abnormality in the pons on CT. EXAM: MRI HEAD WITHOUT CONTRAST TECHNIQUE: Multiplanar, multiecho pulse sequences of the brain and surrounding structures were obtained without intravenous contrast. COMPARISON:  Head CT/CTA 10/03/2019 and MRI 09/11/2019 FINDINGS: Brain: There is a 15 x 12 mm acute infarct in the ventral pons located within and to the left of the midline. Punctate foci of diffusion weighted signal abnormality in the posterior cerebral hemispheres on the prior MRI have resolved. Small foci of T2 hyperintensity in the cerebral white matter bilaterally are unchanged and nonspecific but compatible with mildly age advanced chronic small vessel ischemic disease. A chronic lacunar  infarct is again noted in the left thalamus. The ventricles and sulci are within normal limits for age. No intracranial hemorrhage, mass, midline shift, or extra-axial fluid collection is  identified. Vascular: Unchanged abnormal appearance of the distal left vertebral artery with only thready intermittent flow shown on CTA. Skull and upper cervical spine: Unremarkable bone marrow signal. Sinuses/Orbits: Unremarkable orbits. Paranasal sinuses and mastoid air cells are clear. Other: None. IMPRESSION: 1. Acute pontine infarct. 2. Mild chronic small vessel ischemic disease. Electronically Signed   By: Logan Bores M.D.   On: 10/03/2019 07:54   IR US Guide Vasc Access Right  Result Date: 10/05/2019 de Rosario Jacks, MD     10/06/2019 11:47 AM INDICATION: Stroke. Basilar artery stenosis. EXAM: Diagnostic cerebral angiogram COMPARISON: CT angiogram head and neck October 03, 2019 ANESTHESIA/SEDATION: Versed 2 mg IV; Fentanyl 100 mcg IV Moderate Sedation Time: 57 minutes. The patient was continuously monitored during the procedure by the interventional radiology nurse under my direct supervision. CONTRAST: 55 mL Omnipaque 240 FLUOROSCOPY TIME: Fluoroscopy Time: 8 minutes 6 seconds (493 mGy). COMPLICATIONS: None immediate. TECHNIQUE: Informed written consent was obtained from the patient after a thorough discussion of the procedural risks, benefits and alternatives. All questions were addressed. Maximal Sterile Barrier Technique was utilized including caps, mask, sterile gowns, sterile gloves, sterile drape, hand hygiene and skin antiseptic. A timeout was performed prior to the initiation of the procedure. Local anesthesia with 1 mg of lidocaine 1% and 200 mcg of nitroglycerin was performed in the right snuffbox area. Under grayscale ultrasound guidance, access was gained to the distal right radial artery using modified Seldinger technique and a micropuncture kit. A 5 French sheath was then placed in the right radial artery. Slow intra arterial infusion of 300 mcg of nitroglycerin, 2.5 mg of verapamil and 5000 units of heparin was performed via sheath side port. A road map of the right radial  artery was performed with frontal view. Under the fluoroscopy, a 5 French Simmons 2 glide catheter was navigated over a 0.035 inch Terumo Glidewire into the right subclavian artery. A roadmap of the subclavian artery was obtained. Under fluoroscopy, the catheter was advanced into the right vertebral artery. [place] and lateral views of the head were obtained followed by magnified waters and [place] views, centered on the basilar artery. The catheter was pulled back and then advanced into the right common carotid artery. Frontal and lateral views of the neck were obtained. Under biplane roadmap, the catheter was advanced into the right internal carotid artery. Frontal and lateral angiograms of the head were obtained. The catheter was pulled back and then advanced into the left common carotid artery. Frontal and lateral views of the neck were obtained. The catheter was advanced into the left internal carotid artery under biplane roadmap. Frontal, lateral and magnified oblique views of the head were obtained, centered on the intracranial left ICA. The catheter was subsequently withdrawn. The right radial sheath was removed and inflatable obtained was placed over the access site. FINDINGS: Grayscale ultrasound: The distal right radial artery has normal caliber, adequate for vascular access. Right radial artery roadmap: The right radial artery is normal caliber. No significant anatomical variation noted. Right vertebral artery angiogram: There is mild narrowing (less than 50%) stenosis at the origin of the right vertebral artery. The intracranial right vertebral artery has normal course and caliber. Mild stenosis of the mid basilar artery (less than 50%) without flow limitation. Contrast reflux into the hypoplastic left vertebral artery V4 segment is  noted with opacification of the left PICA. No opacification proximal to the PICA seen. The bilateral PICA, SCA, AICA and PCA are normal in course and caliber. No aneurysm,  AVM or dural AV fistula identified. Major veins and dural sinuses are patent. Right common carotid artery angiogram: Mild atherosclerotic changes are noted in the right carotid bifurcation, without hemodynamically significant stenosis. Right internal carotid artery angiogram: Luminal irregularity of the cavernous segment of the right ICA is noted, consistent with atherosclerotic disease, with mild stenosis at the posterior genu. A prominent posterior communicating artery is noted. The right ACA and MCA have brisk contrast opacification with luminal caliber smooth and tapering. No aneurysm, AVM or dural AV fistula identified. Major veins and dural sinuses are patent. Left common carotid artery angiogram: Atherosclerotic changes are noted in the left carotid bifurcation, without hemodynamically significant stenosis. Left internal carotid artery angiogram: Luminal irregularity is seen in the distal petrous and cavernous left ICA, consistent with atherosclerotic disease, most significant at the paraclinoid segment, where there is approximately 54% stenosis. An infundibular dilatation is noted at the origin of the left posterior communicating artery. The left ACA and MCA have brisk contrast opacification with luminal caliber smooth and tapering. No aneurysm, AVM or dural AV fistula identified. The major veins and dural sinuses are patent. PROCEDURE: No intervention performed IMPRESSION: 1. 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 2. 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 3. 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 PLAN: Case discussed with Dr. Erlinda Hong immediately after the angiogram. Patient is to remain on best medical management with no stenting indicated at this time.   ECHO TEE  Result Date: 10/04/2019    TRANSESOPHOGEAL ECHO REPORT   Patient Name:   Amy Raymond Date of Exam: 10/04/2019 Medical Rec #:  [id number]       Height:       64.0 in  Accession #:    [id number]      Weight:       169.8 lb Date of Birth:  May 04, 1966       BSA:          1.825 m Patient Age:    54 years        BP:           188/62 mmHg Patient Gender: F               HR:           87 bpm. Exam Location:  Inpatient Procedure: 2D Echo, Transesophageal Echo and Saline Contrast Bubble Study Indications:     stroke  History:         Patient has prior history of Echocardiogram examinations, most                  recent 09/11/2019.  Sonographer:     Johny Chess Referring Phys:  [id number] Leanor Kail Diagnosing Phys: Skeet Latch MD PROCEDURE: The transesophogeal probe was passed without difficulty through the esophogus of the patient. Local oropharyngeal anesthetic was provided with Cetacaine. Sedation performed by performing physician. Patients was under conscious sedation during this procedure. Anesthetic administered: 12mg of Fentanyl, 5.08mof Versed. The patient's vital signs; including heart rate, blood pressure, and oxygen saturation; remained stable throughout the procedure. The patient developed no complications during the procedure. IMPRESSIONS  1. Left ventricular ejection fraction, by estimation, is 60 to 65%. The left ventricle has normal function. The left ventricle has no regional wall  motion abnormalities. Left ventricular diastolic function could not be evaluated.  2. Right ventricular systolic function is normal. The right ventricular size is normal. There is normal pulmonary artery systolic pressure.  3. No left atrial/left atrial appendage thrombus was detected.  4. The mitral valve is normal in structure and function. Trivial mitral valve regurgitation. No evidence of mitral stenosis.  5. The aortic valve is tricuspid. Aortic valve regurgitation is trivial. No aortic stenosis is present.  6. The inferior vena cava is normal in size with greater than 50% respiratory variability, suggesting right atrial pressure of 3 mmHg.  7. Agitated saline contrast  bubble study was negative, with no evidence of any interatrial shunt. Conclusion(s)/Recommendation(s): Normal biventricular function without evidence of hemodynamically significant valvular heart disease. FINDINGS  Left Ventricle: Left ventricular ejection fraction, by estimation, is 60 to 65%. The left ventricle has normal function. The left ventricle has no regional wall motion abnormalities. The left ventricular internal cavity size was normal in size. There is  no left ventricular hypertrophy. Right Ventricle: The right ventricular size is normal. No increase in right ventricular wall thickness. Right ventricular systolic function is normal. There is normal pulmonary artery systolic pressure. The tricuspid regurgitant velocity is 1.91 m/s, and  with an assumed right atrial pressure of 0 mmHg, the estimated right ventricular systolic pressure is 25.0 mmHg. Left Atrium: Left atrial size was normal in size. No left atrial/left atrial appendage thrombus was detected. Right Atrium: Right atrial size was normal in size. Pericardium: There is no evidence of pericardial effusion. Mitral Valve: The mitral valve is normal in structure and function. Normal mobility of the mitral valve leaflets. Trivial mitral valve regurgitation. No evidence of mitral valve stenosis. Tricuspid Valve: The tricuspid valve is normal in structure. Tricuspid valve regurgitation is mild . No evidence of tricuspid stenosis. Aortic Valve: The aortic valve is tricuspid. Aortic valve regurgitation is trivial. No aortic stenosis is present. Pulmonic Valve: The pulmonic valve was normal in structure. Pulmonic valve regurgitation is not visualized. No evidence of pulmonic stenosis. Aorta: The aortic root is normal in size and structure. Venous: The inferior vena cava is normal in size with greater than 50% respiratory variability, suggesting right atrial pressure of 3 mmHg. IAS/Shunts: No atrial level shunt detected by color flow Doppler. Agitated  saline contrast was given intravenously to evaluate for intracardiac shunting. Agitated saline contrast bubble study was negative, with no evidence of any interatrial shunt. There  is no evidence of a patent foramen ovale. There is no evidence of an atrial septal defect.  TRICUSPID VALVE TR Peak grad:   14.6 mmHg TR Vmax:        191.00 cm/s Skeet Latch MD Electronically signed by Skeet Latch MD Signature Date/Time: 10/04/2019/4:03:07 PM    Final    CT HEAD CODE STROKE WO CONTRAST  Result Date: 10/09/2019 CLINICAL DATA:  Code stroke.  Slurred speech.  Recent stroke. EXAM: CT HEAD WITHOUT CONTRAST TECHNIQUE: Contiguous axial images were obtained from the base of the skull through the vertex without intravenous contrast. COMPARISON:  MRI head 10/03/2019 FINDINGS: Brain: Hypodensity in the central and ventral pons similar to the prior MRI compatible with subacute infarct. 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 Negative for new  infarct.  Negative for acute hemorrhage or mass. Vascular: Negative for hyperdense vessel Skull: Negative Sinuses/Orbits: Negative Other: None ASPECTS ([place] Stroke Program Early CT Score) - Ganglionic level infarction (caudate, lentiform nuclei, internal capsule, insula, M1-M3 cortex): 7 - Supraganglionic infarction (  M4-M6 cortex): 3 Total score (0-10 with 10 being normal): 10 IMPRESSION: 1. Recent infarct in the central pons unchanged. No new area of infarct or hemorrhage. 2. ASPECTS is 10 3. These results were called by telephone at the time of interpretation on 10/09/2019 at 1:47 pm to provider Rory Percy , who verbally acknowledged these results. Electronically Signed   By: Franchot Gallo M.D.   On: 10/09/2019 13:48   IR ANGIO INTRA EXTRACRAN SEL INTERNAL CAROTID BILAT MOD SED  Result Date: 10/05/2019 de Rosario Jacks, MD     10/06/2019 11:47 AM INDICATION: Stroke. Basilar artery stenosis. EXAM: Diagnostic cerebral angiogram COMPARISON: CT  angiogram head and neck October 03, 2019 ANESTHESIA/SEDATION: Versed 2 mg IV; Fentanyl 100 mcg IV Moderate Sedation Time: 57 minutes. The patient was continuously monitored during the procedure by the interventional radiology nurse under my direct supervision. CONTRAST: 55 mL Omnipaque 240 FLUOROSCOPY TIME: Fluoroscopy Time: 8 minutes 6 seconds (493 mGy). COMPLICATIONS: None immediate. TECHNIQUE: Informed written consent was obtained from the patient after a thorough discussion of the procedural risks, benefits and alternatives. All questions were addressed. Maximal Sterile Barrier Technique was utilized including caps, mask, sterile gowns, sterile gloves, sterile drape, hand hygiene and skin antiseptic. A timeout was performed prior to the initiation of the procedure. Local anesthesia with 1 mg of lidocaine 1% and 200 mcg of nitroglycerin was performed in the right snuffbox area. Under grayscale ultrasound guidance, access was gained to the distal right radial artery using modified Seldinger technique and a micropuncture kit. A 5 French sheath was then placed in the right radial artery. Slow intra arterial infusion of 300 mcg of nitroglycerin, 2.5 mg of verapamil and 5000 units of heparin was performed via sheath side port. A road map of the right radial artery was performed with frontal view. Under the fluoroscopy, a 5 French Simmons 2 glide catheter was navigated over a 0.035 inch Terumo Glidewire into the right subclavian artery. A roadmap of the subclavian artery was obtained. Under fluoroscopy, the catheter was advanced into the right vertebral artery. Townes and lateral views of the head were obtained followed by magnified waters and [place] views, centered on the basilar artery. The catheter was pulled back and then advanced into the right common carotid artery. Frontal and lateral views of the neck were obtained. Under biplane roadmap, the catheter was advanced into the right internal carotid artery.  Frontal and lateral angiograms of the head were obtained. The catheter was pulled back and then advanced into the left common carotid artery. Frontal and lateral views of the neck were obtained. The catheter was advanced into the left internal carotid artery under biplane roadmap. Frontal, lateral and magnified oblique views of the head were obtained, centered on the intracranial left ICA. The catheter was subsequently withdrawn. The right radial sheath was removed and inflatable obtained was placed over the access site. FINDINGS: Grayscale ultrasound: The distal right radial artery has normal caliber, adequate for vascular access. Right radial artery roadmap: The right radial artery is normal caliber. No significant anatomical variation noted. Right vertebral artery angiogram: There is mild narrowing (less than 50%) stenosis at the origin of the right vertebral artery. The intracranial right vertebral artery has normal course and caliber. Mild stenosis of the mid basilar artery (less than 50%) without flow limitation. Contrast reflux into the hypoplastic left vertebral artery V4 segment is noted with opacification of the left PICA. No opacification proximal to the PICA seen. The bilateral PICA, SCA, AICA and PCA  are normal in course and caliber. No aneurysm, AVM or dural AV fistula identified. Major veins and dural sinuses are patent. Right common carotid artery angiogram: Mild atherosclerotic changes are noted in the right carotid bifurcation, without hemodynamically significant stenosis. Right internal carotid artery angiogram: Luminal irregularity of the cavernous segment of the right ICA is noted, consistent with atherosclerotic disease, with mild stenosis at the posterior genu. A prominent posterior communicating artery is noted. The right ACA and MCA have brisk contrast opacification with luminal caliber smooth and tapering. No aneurysm, AVM or dural AV fistula identified. Major veins and dural sinuses are  patent. Left common carotid artery angiogram: Atherosclerotic changes are noted in the left carotid bifurcation, without hemodynamically significant stenosis. Left internal carotid artery angiogram: Luminal irregularity is seen in the distal petrous and cavernous left ICA, consistent with atherosclerotic disease, most significant at the paraclinoid segment, where there is approximately 54% stenosis. An infundibular dilatation is noted at the origin of the left posterior communicating artery. The left ACA and MCA have brisk contrast opacification with luminal caliber smooth and tapering. No aneurysm, AVM or dural AV fistula identified. The major veins and dural sinuses are patent. PROCEDURE: No intervention performed IMPRESSION: 1. 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 2. 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 3. 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 PLAN: Case discussed with Dr. Erlinda Hong immediately after the angiogram. Patient is to remain on best medical management with no stenting indicated at this time.   IR ANGIO VERTEBRAL SEL VERTEBRAL UNI R MOD SED  Result Date: 10/05/2019 de Rosario Jacks, MD     10/06/2019 11:47 AM INDICATION: Stroke. Basilar artery stenosis. EXAM: Diagnostic cerebral angiogram COMPARISON: CT angiogram head and neck October 03, 2019 ANESTHESIA/SEDATION: Versed 2 mg IV; Fentanyl 100 mcg IV Moderate Sedation Time: 57 minutes. The patient was continuously monitored during the procedure by the interventional radiology nurse under my direct supervision. CONTRAST: 55 mL Omnipaque 240 FLUOROSCOPY TIME: Fluoroscopy Time: 8 minutes 6 seconds (493 mGy). COMPLICATIONS: None immediate. TECHNIQUE: Informed written consent was obtained from the patient after a thorough discussion of the procedural risks, benefits and alternatives. All questions were addressed. Maximal Sterile Barrier Technique was utilized including  caps, mask, sterile gowns, sterile gloves, sterile drape, hand hygiene and skin antiseptic. A timeout was performed prior to the initiation of the procedure. Local anesthesia with 1 mg of lidocaine 1% and 200 mcg of nitroglycerin was performed in the right snuffbox area. Under grayscale ultrasound guidance, access was gained to the distal right radial artery using modified Seldinger technique and a micropuncture kit. A 5 French sheath was then placed in the right radial artery. Slow intra arterial infusion of 300 mcg of nitroglycerin, 2.5 mg of verapamil and 5000 units of heparin was performed via sheath side port. A road map of the right radial artery was performed with frontal view. Under the fluoroscopy, a 5 French Simmons 2 glide catheter was navigated over a 0.035 inch Terumo Glidewire into the right subclavian artery. A roadmap of the subclavian artery was obtained. Under fluoroscopy, the catheter was advanced into the right vertebral artery. Townes and lateral views of the head were obtained followed by magnified waters and [place] views, centered on the basilar artery. The catheter was pulled back and then advanced into the right common carotid artery. Frontal and lateral views of the neck were obtained. Under biplane roadmap, the catheter was advanced into the right internal carotid  artery. Frontal and lateral angiograms of the head were obtained. The catheter was pulled back and then advanced into the left common carotid artery. Frontal and lateral views of the neck were obtained. The catheter was advanced into the left internal carotid artery under biplane roadmap. Frontal, lateral and magnified oblique views of the head were obtained, centered on the intracranial left ICA. The catheter was subsequently withdrawn. The right radial sheath was removed and inflatable obtained was placed over the access site. FINDINGS: Grayscale ultrasound: The distal right radial artery has normal caliber, adequate for  vascular access. Right radial artery roadmap: The right radial artery is normal caliber. No significant anatomical variation noted. Right vertebral artery angiogram: There is mild narrowing (less than 50%) stenosis at the origin of the right vertebral artery. The intracranial right vertebral artery has normal course and caliber. Mild stenosis of the mid basilar artery (less than 50%) without flow limitation. Contrast reflux into the hypoplastic left vertebral artery V4 segment is noted with opacification of the left PICA. No opacification proximal to the PICA seen. The bilateral PICA, SCA, AICA and PCA are normal in course and caliber. No aneurysm, AVM or dural AV fistula identified. Major veins and dural sinuses are patent. Right common carotid artery angiogram: Mild atherosclerotic changes are noted in the right carotid bifurcation, without hemodynamically significant stenosis. Right internal carotid artery angiogram: Luminal irregularity of the cavernous segment of the right ICA is noted, consistent with atherosclerotic disease, with mild stenosis at the posterior genu. A prominent posterior communicating artery is noted. The right ACA and MCA have brisk contrast opacification with luminal caliber smooth and tapering. No aneurysm, AVM or dural AV fistula identified. Major veins and dural sinuses are patent. Left common carotid artery angiogram: Atherosclerotic changes are noted in the left carotid bifurcation, without hemodynamically significant stenosis. Left internal carotid artery angiogram: Luminal irregularity is seen in the distal petrous and cavernous left ICA, consistent with atherosclerotic disease, most significant at the paraclinoid segment, where there is approximately 54% stenosis. An infundibular dilatation is noted at the origin of the left posterior communicating artery. The left ACA and MCA have brisk contrast opacification with luminal caliber smooth and tapering. No aneurysm, AVM or dural AV  fistula identified. The major veins and dural sinuses are patent. PROCEDURE: No intervention performed IMPRESSION: 1. 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 2. 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 3. 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 PLAN: Case discussed with Dr. Erlinda Hong immediately after the angiogram. Patient is to remain on best medical management with no stenting indicated at this time.       Discharge Exam: Vitals:   10/17/19 0355 10/17/19 0751  BP: 140/66 (!) 156/65  Pulse: 80 68  Resp: 19 18  Temp: 98.6 F (37 C) 98.6 F (37 C)  SpO2: 98% 100%    GENERAL: No acute distress.  Appears well.  HEENT: MMM.  Vision and hearing grossly intact.  NECK: Supple.  No apparent JVD.  RESP:  No IWOB. Good air movement bilaterally. CVS:  RRR. Heart sounds normal.  ABD/GI/GU: Bowel sounds present. Soft. Non tender.  MSK/EXT:  Moves extremities. No apparent deformity or edema.  SKIN: no apparent skin lesion or wound NEURO: Awake, alert and oriented appropriately.  Mild slurred speech and right facial droop.  Motor 4+/5 on the right and 5/5 on the left.  Mild pronator drift on the right.  Sensations intact in all dermatomes. PSYCH:  Calm. Normal affect.   The results of significant diagnostics from this hospitalization (including imaging, microbiology, ancillary and laboratory) are listed below for reference.     Microbiology: Recent Results (from the past 240 hour(s))  SARS CORONAVIRUS 2 (TAT 6-24 HRS) Nasopharyngeal Nasopharyngeal Swab     Status: None   Collection Time: 10/09/19  3:55 PM   Specimen: Nasopharyngeal Swab  Result Value Ref Range Status   SARS Coronavirus 2 NEGATIVE NEGATIVE Final    Comment: (NOTE) SARS-CoV-2 target nucleic acids are NOT DETECTED. The SARS-CoV-2 RNA is generally detectable in upper and lower respiratory specimens during the acute phase of infection. Negative results do not  preclude SARS-CoV-2 infection, do not rule out co-infections with other pathogens, and should not be used as the sole basis for treatment or other patient management decisions. Negative results must be combined with clinical observations, patient history, and epidemiological information. The expected result is Negative. Fact Sheet for Patients: SugarRoll.be Fact Sheet for Healthcare Providers: https://www.woods-mathews.com/ This test is not yet approved or cleared by the [place] FDA and  has been authorized for detection and/or diagnosis of SARS-CoV-2 by FDA under an Emergency Use Authorization (EUA). This EUA will remain  in effect (meaning this test can be used) for the duration of the COVID-19 declaration under Section 56 4(b)(1) of the Act, 21 U.S.C. section 360bbb-3(b)(1), unless the authorization is terminated or revoked sooner. Performed at [facility], [place] [address], [place], [place] [address]   SARS CORONAVIRUS 2 (TAT 6-24 HRS) Nasopharyngeal Nasopharyngeal Swab     Status: None   Collection Time: 10/16/19  5:26 PM   Specimen: Nasopharyngeal Swab  Result Value Ref Range Status   SARS Coronavirus 2 NEGATIVE NEGATIVE Final    Comment: (NOTE) SARS-CoV-2 target nucleic acids are NOT DETECTED. The SARS-CoV-2 RNA is generally detectable in upper and lower respiratory specimens during the acute phase of infection. Negative results do not preclude SARS-CoV-2 infection, do not rule out co-infections with other pathogens, and should not be used as the sole basis for treatment or other patient management decisions. Negative results must be combined with clinical observations, patient history, and epidemiological information. The expected result is Negative. Fact Sheet for Patients: SugarRoll.be Fact Sheet for Healthcare Providers: https://www.woods-mathews.com/ This test is not yet  approved or cleared by the [place] FDA and  has been authorized for detection and/or diagnosis of SARS-CoV-2 by FDA under an Emergency Use Authorization (EUA). This EUA will remain  in effect (meaning this test can be used) for the duration of the COVID-19 declaration under Section 56 4(b)(1) of the Act, 21 U.S.C. section 360bbb-3(b)(1), unless the authorization is terminated or revoked sooner. Performed at [facility], [place] [address] [place], [place] [address]      Labs: BNP (last 3 results) No results for input(s): BNP in the last 8760 hours. Basic Metabolic Panel: Recent Labs  Lab 10/11/19 1517 10/15/19 0502  NA 141 144  K 3.2* 3.5  CL 107 108  CO2 23 26  GLUCOSE 207* 161*  BUN 7 8  CREATININE 0.61 0.65  CALCIUM 9.2 9.2  MG  --  1.8  PHOS  --  4.0   Liver Function Tests: Recent Labs  Lab 10/15/19 0502  ALBUMIN 2.8*   No results for input(s): LIPASE, AMYLASE in the last 168 hours. No results for input(s): AMMONIA in the last 168 hours. CBC: Recent Labs  Lab 10/11/19 0902 10/15/19 0502  WBC 9.4 8.3  NEUTROABS  5.9  --   HGB 9.3* 10.0*  HCT 29.6* 31.2*  MCV 74.4* 75.0*  PLT 533* 547*   Cardiac Enzymes: No results for input(s): CKTOTAL, CKMB, CKMBINDEX, TROPONINI in the last 168 hours. BNP: Invalid input(s): POCBNP CBG: Recent Labs  Lab 10/16/19 0614 10/16/19 1119 10/16/19 1544 10/16/19 2123 10/17/19 0613  GLUCAP 156* 167* 184* 144* 161*   D-Dimer No results for input(s): DDIMER in the last 72 hours. Hgb A1c No results for input(s): HGBA1C in the last 72 hours. Lipid Profile No results for input(s): CHOL, HDL, LDLCALC, TRIG, CHOLHDL, LDLDIRECT in the last 72 hours. Thyroid function studies No results for input(s): TSH, T4TOTAL, T3FREE, THYROIDAB in the last 72 hours.  Invalid input(s): FREET3 Anemia work up No results for input(s): VITAMINB12, FOLATE, FERRITIN, TIBC, IRON, RETICCTPCT in the last 72 hours. Urinalysis     Component Value Date/Time   COLORURINE YELLOW 10/03/2019 0814   APPEARANCEUR CLEAR 10/03/2019 0814   LABSPEC <1.005 (L) 10/03/2019 0814   PHURINE 5.5 10/03/2019 0814   GLUCOSEU NEGATIVE 10/03/2019 0814   HGBUR NEGATIVE 10/03/2019 0814   BILIRUBINUR NEGATIVE 10/03/2019 0814   KETONESUR 15 (A) 10/03/2019 0814   PROTEINUR NEGATIVE 10/03/2019 0814   UROBILINOGEN 1.0 03/04/2007 1518   NITRITE NEGATIVE 10/03/2019 0814   LEUKOCYTESUR NEGATIVE 10/03/2019 0814   Sepsis Labs Invalid input(s): PROCALCITONIN,  WBC,  LACTICIDVEN   Time coordinating discharge: 35 minutes  SIGNED:  Mercy Riding, MD  [facility] 10/17/2019, 10:19 AM  If 7PM-7AM, please contact night-coverage [email or web address] Password [email or web address]

## 2019-10-17 NOTE — TOC Transition Note (Signed)
Transition of Care [facility]) - CM/SW Discharge Note   Patient Details  Name: Amy Raymond MRN: [id number] Date of Birth: 1966-03-22  Transition [facility]) CM/SW Contact:  Kermit Balo, RN Phone Number: 10/17/2019, 11:00 AM   Clinical Narrative:    Pt is discharging to inpatient rehab in [facility] today. PTAR to provide transport. Pt to call and update her mother.  Bedside RN updated and d/c packet at the desk.   Number for report: [phone] Room: C400    Final next level of care: IP Rehab Facility Barriers to Discharge: No Barriers Identified   Patient Goals and CMS Choice   CMS Medicare.gov Compare Post Acute Care list provided to:: Patient Choice offered to / list presented to : Patient, Parent  Discharge Placement                       Discharge Plan and Services   Discharge Planning Services: CM Consult Post Acute Care Choice: IP Rehab                               Social Determinants of Health (SDOH) Interventions     Readmission Risk Interventions No flowsheet data found.

## 2019-10-17 NOTE — Progress Notes (Addendum)
Physical Therapy Treatment Patient Details Name: Amy Raymond MRN: [id number] DOB: 1966-01-13 Today's Date: 10/17/2019    History of Present Illness  Amy Raymond is a 54 y.o. 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 New punctate B cerebral and cerebellar infarcts in setting of central pontine infarct last week.    PT Comments    Pt seated in recliner with mom by her side.  She is eager to transfer to rehab today.  Focused on gt training with gaze stabilization and LE strengthening exercises in standing.  R LE remains weaker and she continues to present with coordination deficits.  Continue to recommend aggressive CIR therapies at this time to improve strength and function before returning home.   Follow Up Recommendations  CIR;Supervision/Assistance - 24 hour     Equipment Recommendations  Rolling walker with 5" wheels    Recommendations for Other Services       Precautions / Restrictions Precautions Precautions: Fall Precaution Comments: fell before presenting to ED Restrictions Weight Bearing Restrictions: No    Mobility  Bed Mobility               General bed mobility comments: Pt seated in recliner on arrival.  Transfers Overall transfer level: Needs assistance Equipment used: None   Sit to Stand: Supervision;Min assist(supervision to rise to standing and min assistance to maintain standing.)         General transfer comment: Cues for hand placement and safety.  Pt unable to maintain balance once in standing.  She still remains with LOB and poor coordination in standing.  Ambulation/Gait Ambulation/Gait assistance: Min assist Gait Distance (Feet): 80 Feet Assistive device: None Gait Pattern/deviations: Decreased step length - right;Decreased dorsiflexion - right;Ataxic;Drifts right/left;Staggering right;Narrow base of support;Scissoring Gait velocity:  decreased   General Gait Details: No buckling noted, but drifting and LOB to the R noted.  She required cues for gaze stabilization to improve her balance and cues for increased reciprocal armswing.   Stairs             Wheelchair Mobility    Modified Rankin (Stroke Patients Only)       Balance Overall balance assessment: Needs assistance Sitting-balance support: Feet supported;No upper extremity supported Sitting balance-Leahy Scale: Fair       Standing balance-Leahy Scale: Poor                              Cognition Arousal/Alertness: Awake/alert Behavior During Therapy: Impulsive Overall Cognitive Status: Impaired/Different from baseline Area of Impairment: Attention;Safety/judgement;Awareness;Problem solving                   Current Attention Level: Selective   Following Commands: Follows one step commands consistently Safety/Judgement: Decreased awareness of safety;Decreased awareness of deficits Awareness: Emergent Problem Solving: Slow processing;Requires verbal cues General Comments: cues for controlling impulsivity      Exercises General Exercises - Lower Extremity Hip Flexion/Marching: AROM;Both;10 reps;Standing Heel Raises: AROM;Both;10 reps;Standing Mini-Sqauts: AROM;Both;10 reps;Standing    General Comments        Pertinent Vitals/Pain Pain Assessment: Faces Faces Pain Scale: No hurt    Home Living                      Prior Function            PT Goals (current goals can now be found  in the care plan section) Acute Rehab PT Goals Potential to Achieve Goals: Good Progress towards PT goals: Progressing toward goals    Frequency    Min 4X/week      PT Plan Current plan remains appropriate    Co-evaluation              AM-PAC PT "6 Clicks" Mobility   Outcome Measure  Help needed turning from your back to your side while in a flat bed without using bedrails?: None Help needed moving from  lying on your back to sitting on the side of a flat bed without using bedrails?: None Help needed moving to and from a bed to a chair (including a wheelchair)?: A Little Help needed standing up from a chair using your arms (e.g., wheelchair or bedside chair)?: A Little Help needed to walk in hospital room?: A Little Help needed climbing 3-5 steps with a railing? : A Little 6 Click Score: 20    End of Session Equipment Utilized During Treatment: Gait belt Activity Tolerance: Patient tolerated treatment well Patient left: with call bell/phone within reach;in chair;with chair alarm set;with family/visitor present Nurse Communication: Mobility status PT Visit Diagnosis: Other abnormalities of gait and mobility (R26.89);Ataxic gait (R26.0)     Time: 1140-1150 PT Time Calculation (min) (ACUTE ONLY): 10 min  Charges:  $Gait Training: 8-22 mins                     Bonney Leitz , PTA Acute Rehabilitation Services Pager [phone] Office [phone]     Amy Raymond Artis Delay 10/17/2019, 2:20 PM

## 2019-11-10 ENCOUNTER — Ambulatory Visit: Payer: BLUE CROSS/BLUE SHIELD | Attending: Family

## 2019-11-10 DIAGNOSIS — Z23 Encounter for immunization: Secondary | ICD-10-CM

## 2019-11-12 ENCOUNTER — Other Ambulatory Visit: Payer: Self-pay

## 2019-11-12 NOTE — Progress Notes (Signed)
   Covid-19 Vaccination Clinic  Name:  Amy Raymond    MRN: [id number] DOB: [birth year]  11/12/2019  Ms. [name] was observed post Covid-19 immunization for 15 minutes without incident. She was provided with Vaccine Information Sheet and instruction to access the V-Safe system.   [name] was instructed to call 911 with any severe reactions post vaccine: Marland Kitchen Difficulty breathing  . Swelling of face and throat  . A fast heartbeat  . A bad rash all over body  . Dizziness and weakness   Immunizations Administered    Name Date Dose VIS Date Route   Moderna COVID-19 Vaccine 11/10/2019  3:42 PM 0.5 mL 07/12/2019 Intramuscular   Manufacturer: Moderna   Lot: [id number]   NDC: 84037-543-60

## 2019-12-13 ENCOUNTER — Ambulatory Visit: Payer: BLUE CROSS/BLUE SHIELD | Attending: Family

## 2019-12-13 DIAGNOSIS — Z23 Encounter for immunization: Secondary | ICD-10-CM

## 2019-12-13 NOTE — Progress Notes (Signed)
   Covid-19 Vaccination Clinic  Name:  Amy Raymond    MRN: [id number] DOB: October 24, 1965  12/13/2019  [name] was observed post Covid-19 immunization for 15 minutes without incident. She was provided with Vaccine Information Sheet and instruction to access the V-Safe system.   Ms. Date was instructed to call 911 with any severe reactions post vaccine: Marland Kitchen Difficulty breathing  . Swelling of face and throat  . A fast heartbeat  . A bad rash all over body  . Dizziness and weakness   Immunizations Administered    Name Date Dose VIS Date Route   Moderna COVID-19 Vaccine 12/13/2019 12:23 PM 0.5 mL 07/2019 Intramuscular   Manufacturer: Moderna   Lot: [id number]   NDC: 32009-417-91

## 2020-06-05 ENCOUNTER — Ambulatory Visit: Payer: BLUE CROSS/BLUE SHIELD | Attending: Family

## 2020-06-05 DIAGNOSIS — Z23 Encounter for immunization: Secondary | ICD-10-CM

## 2020-07-25 NOTE — Progress Notes (Signed)
   Covid-19 Vaccination Clinic  Name:  Amy Raymond    MRN: [id number] DOB: 12-Jun-1966  07/25/2020  [name] was observed post Covid-19 immunization for 15 minutes without incident. She was provided with Vaccine Information Sheet and instruction to access the V-Safe system.   [name] was instructed to call 911 with any severe reactions post vaccine: Marland Kitchen Difficulty breathing  . Swelling of face and throat  . A fast heartbeat  . A bad rash all over body  . Dizziness and weakness   Immunizations Administered    No immunizations on file.

## 2021-01-10 ENCOUNTER — Ambulatory Visit: Payer: BLUE CROSS/BLUE SHIELD

## 2021-01-10 ENCOUNTER — Encounter: Payer: BLUE CROSS/BLUE SHIELD | Admitting: Occupational Therapy

## 2021-01-14 ENCOUNTER — Ambulatory Visit: Payer: 59 | Attending: Neurology | Admitting: Physical Therapy

## 2021-01-14 ENCOUNTER — Ambulatory Visit: Payer: 59

## 2021-01-14 ENCOUNTER — Other Ambulatory Visit: Payer: Self-pay

## 2021-01-14 DIAGNOSIS — R2689 Other abnormalities of gait and mobility: Secondary | ICD-10-CM | POA: Insufficient documentation

## 2021-01-14 DIAGNOSIS — M6281 Muscle weakness (generalized): Secondary | ICD-10-CM | POA: Diagnosis present

## 2021-01-14 DIAGNOSIS — R2681 Unsteadiness on feet: Secondary | ICD-10-CM | POA: Insufficient documentation

## 2021-01-14 DIAGNOSIS — R471 Dysarthria and anarthria: Secondary | ICD-10-CM

## 2021-01-14 NOTE — Patient Instructions (Addendum)
Abdominal Breathing : 15 minutes, twice a day   . Shoulders down - this is a cue to relax . Place your hand on your abdomen - this helps you focus on easy abdominal breath support - the best and most relaxed way to breathe . Breathe in through your nose and fill your belly with air, watching your hand move outward . Breathe out through your mouth and watch your belly move in. An audible "sh"  may help   Think of your belly as a balloon, when you fill with air (inhale), the balloon gets bigger. As the air goes out (exhale), the balloon deflates.  If you are having difficulty coordinating this, lay on your back with a plastic cup on your belly and repeat the above steps, watching your belly move up with inhalation and down with exhalations  Practice breathing in and out in front of a mirror, watching your belly Breathe in for a count of 5 and breathe out for a count of 5  ----------------------------------------------------------------------------------------------------------------  SLOW LOUD OVER-ENNUNCIATE PAUSE (to breath)   Purpose: To improve lip and tongue strength in order to help people understand you. These exercises can be done while looking in a mirror.  Do them twice a day.  Lips  1. Press hard and briefly hold all the beginning sounds in these words: Repeat each set 2 times     "ma ma ma"    "boo boo boo"  "pa pa pa."          "moo moo moo" "bye bye bye"  "pie pie pie"          "me me me"  "bee bee bee"  "pea pea pea"   2. Pucker your lips tightly and say "OOOO," then smile wide and say "EEEE."  Repeat _2_ times.  3. Practice whistling for __30__ seconds.  4. "Blow out" candles.  Repeat _10___ times.  5. Blow kisses - make them LOUD!  Repeat __10__ times.   Tip of Tongue  1. Say the sound  "ta ta ta," "la la la,"          and "Research scientist (physical sciences)."  Repeat __2__ times.               "tee tee tee" "lee lee lee"   "dee dee dee"        "too too too" "loo loo loo"  "do do  do"  2. Say "time," "took," "take," "town," and "Tom."  Repeat __2__ times.  3. Say "long," "look," "low," "lay" and "lie."  Repeat _2__ times.  4. Say "name," "neck," "not," "new," and "no."  Repeat __2_ times.  5. Say "down," "dip," "dot," "Don," and "do."  Repeat __2__ times   Back of Tongue  1. Say the sounds "ka ka ka" and "go go go."   Repeat ___2_ times.       "cow cow cow" "guy guy guy"       "koo koo koo" "goo goo goo"  2. Say "kick," "cake," "Jae Dire," "key," "keep," "kite," and "Ken."  Repeat __2__ times.  3. Say "gate," "gag," "got," "get," "gone," and "go."  Repeat __2__ times.   BUTTERCUP  CATERPILLAR  BASEBALLL PLAYER  [place]  [place]  SLOW AND BIG - EXAGGERATE YOUR MOUTH, MAKE EACH CONSONANT

## 2021-01-14 NOTE — Therapy (Signed)
[facility] [address] [place], [place], [address] Phone: [phone]   Fax:  [phone]  Speech Language Pathology Evaluation  Patient Details  Name: Amy Raymond MRN: [id number] Date of Birth: 1966/04/13 Referring Provider (SLP): Annia Belt, FNP (PCP (doc)- [place], Julie M NP)   Encounter Date: 01/14/2021   End of Session - 01/14/21 1146    Visit Number 1    Number of Visits 9    Date for SLP Re-Evaluation 02/25/21    Authorization Type [employer]    SLP Start Time 1105    SLP Stop Time  1145    SLP Time Calculation (min) 40 min    Activity Tolerance Patient tolerated treatment well           Past Medical History:  Diagnosis Date  . CVA (cerebral vascular accident) (HCC)    09/11/19; 10/03/19  . Diabetes mellitus without complication (HCC)   . Dyslipidemia   . Hypertension   . Hypothyroidism (acquired)     Past Surgical History:  Procedure Laterality Date  . BUBBLE STUDY  10/04/2019   Procedure: BUBBLE STUDY;  Surgeon: Chilton Si, MD;  Location: [facility] ENDOSCOPY;  Service: Cardiovascular;;  . IR ANGIO INTRA EXTRACRAN SEL INTERNAL CAROTID BILAT MOD SED  10/05/2019  . IR ANGIO VERTEBRAL SEL VERTEBRAL UNI R MOD SED  10/05/2019  . IR US GUIDE VASC ACCESS RIGHT  10/05/2019  . TEE WITHOUT CARDIOVERSION N/A 10/04/2019   Procedure: TRANSESOPHAGEAL ECHOCARDIOGRAM (TEE);  Surgeon: Chilton Si, MD;  Location: [facility] ENDOSCOPY;  Service: Cardiovascular;  Laterality: N/A;  . TUBAL LIGATION      There were no vitals filed for this visit.       SLP Evaluation [facility] - 01/14/21 1101      SLP Visit Information   SLP Received On 01/02/21    Referring Provider (SLP) Annia Belt, FNP   PCP (doc)- [place], Julie M NP   Onset Date Multiple strokes, most recent in March 2021    Medical Diagnosis CVA      Subjective   Patient/Family Stated Goal "to speak clearly and understandable"       Pain Assessment   Currently in Pain? No/denies      General Information   HPI 55 year old female with history of CVA, HTN, HLD and DM-2 presenting with slurred speech and found to have new punctate bilateral cerebral and cerebellar infarct on MRI.  Imaging also revealed moderate right ICA stenosis and moderate to severe left ICA stenosis.    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      Balance Screen   Has the patient fallen in the past 6 months No      Prior Functional Status   Cognitive/Linguistic Baseline Within functional limits    Type of Home House     Lives With Family   mom   Available Support Family    Education college    Vocation Unemployed   cleaning service     Cognition   Overall Cognitive Status Within Functional Limits for tasks assessed      Auditory Comprehension   Overall Auditory Comprehension Appears within functional limits for tasks assessed      Verbal Expression   Overall Verbal Expression Appears within functional limits for tasks assessed      Oral Motor/Sensory Function   Overall Oral Motor/Sensory Function Impaired  Labial ROM Within Functional Limits    Labial Symmetry Within Functional Limits    Labial Coordination Reduced    Lingual ROM Reduced right;Reduced left    Lingual Symmetry Abnormal symmetry left    Lingual Strength Within Functional Limits    Lingual Sensation Reduced Left    Lingual Coordination Reduced    Facial ROM Within Functional Limits    Facial Symmetry Within Functional Limits      Motor Speech   Overall Motor Speech Impaired    Respiration Impaired    Level of Impairment Conversation    Phonation Normal    Resonance Within functional limits    Articulation Impaired    Level of Impairment Conversation    Intelligibility Intelligibility reduced    Word 75-100% accurate    Phrase  75-100% accurate    Sentence 75-100% accurate    Conversation 50-74% accurate    Motor Planning Witnin functional limits    Effective Techniques Slow rate;Over-articulate;Pause                           SLP Education - 01/14/21 1123    Education Details eval results, possible goals, dysarthria compensations    Person(s) Educated Patient    Methods Explanation;Demonstration;Handout    Comprehension Verbalized understanding;Returned demonstration;Need further instruction           STGs=LTGs    SLP Long Term Goals - 01/14/21 1148      SLP LONG TERM GOAL #1   Title Pt will complete dysarthria HEP with rare min A for 3 sessions    Time 4    Period Weeks    Status New      SLP LONG TERM GOAL #2   Title Pt will generate abdominal breathing in 16/20 answers to SLP questions over 2 sessions    Time 4    Period Weeks    Status New      SLP LONG TERM GOAL #3   Title Pt will demonstrate dysarthria compensations in 10-15 minute mod complex conversation with occasional min A over 2 sessions    Time 4    Period Weeks    Status New      SLP LONG TERM GOAL #4   Title Pt will report improved communication effectiveness via PROM by last ST session    Baseline CES: 24 & CSF:19    Time 4    Period Weeks    Status New            Plan - 01/14/21 1150    Clinical Impression Statement [name] was referred for OPST evaluation to assess communication secondary to multiple strokes. Most recent stroke was March 2021. Pt denied any change or difficulty with cognition, word finding, or swallowing, but pt stated people "can't understand me." She endorsed persistent difficulty being understood by both family and unfamiliar listeners, especially over the telephone, and that she is often cued to slow down or repeat herself. Pt endorses she is unable to return to workforce secondary to reduced speech intelligiblity impacting her communication. OME revealed reduced lingual  coordination/ROM and reduced breath support. Pt exhibited increased rate of speech in conversation, particularly towards end of her verbalizations. Speech intelligiblity appeared impacted by rate of speech and lack of adequate breath support with pt exhibiting SOB after speaking. Communication PROM completed, which indicated difficulty speaking over the telephone, at a distance, and in small groups. SLP introduced abdominal breathing and reviewed dysarthria compensations this  session. Pt would benefit from skilled ST intervention to address residual dysarthria to maximize communication effectiveness as it has impacted her ability to return to work and reduced her functional independence.    Speech Therapy Frequency 2x / week    Duration 4 weeks    Treatment/Interventions Oral motor exercises;Cueing hierarchy;Functional tasks;Multimodal communcation approach;Language facilitation;Compensatory techniques;Internal/external aids;SLP instruction and feedback    Potential to Achieve Goals Fair    Potential Considerations Ability to learn/carryover information;Previous level of function    SLP Home Exercise Plan provided    Consulted and Agree with Plan of Care Patient           Patient will benefit from skilled therapeutic intervention in order to improve the following deficits and impairments:   Dysarthria and anarthria    Problem List Patient Active Problem List   Diagnosis Date Noted  . Embolic stroke (HCC) 10/03/2019  . Hypothyroidism (acquired)   . Hypertension   . Dyslipidemia   . Diabetes mellitus without complication (HCC)   . Intractable headache 09/11/2019  . Hypothyroidism 09/11/2019  . Type 2 diabetes mellitus without complication (HCC) 09/11/2019  . Acute CVA (cerebrovascular accident) (HCC) 09/11/2019  . Elevated liver enzymes 09/11/2019    Janann Colonel, MA CCC-SLP 01/14/2021, 2:14 PM   [facility] [address]  [address] [place], [place], [address] Phone: [phone]   Fax:  [phone]  Name: BRINDY HIGGINBOTHAM MRN: [id number] Date of Birth: Aug 19, 1965

## 2021-01-15 NOTE — Therapy (Signed)
[facility] [address] [place], [place], [address] Phone: [phone]   Fax:  [phone]  Physical Therapy Evaluation  Patient Details  Name: Amy Raymond MRN: [id number] Date of Birth: [birth year] Referring Provider (PT): Emmie Niemann, NP   Encounter Date: 01/14/2021   PT End of Session - 01/14/21 1903    Visit Number 1    Number of Visits 13    Date for PT Re-Evaluation 03/04/21   week + 7 per new OP cert policy, email KP 01/04/21   Authorization Type [employer] (awaiting auth info)    PT Start Time 1019    PT Stop Time 1101    PT Time Calculation (min) 42 min    Activity Tolerance Patient tolerated treatment well    Behavior During Therapy [facility] for tasks assessed/performed           Past Medical History:  Diagnosis Date  . CVA (cerebral vascular accident) (HCC)    09/11/19; 10/03/19  . Diabetes mellitus without complication (HCC)   . Dyslipidemia   . Hypertension   . Hypothyroidism (acquired)     Past Surgical History:  Procedure Laterality Date  . BUBBLE STUDY  10/04/2019   Procedure: BUBBLE STUDY;  Surgeon: Chilton Si, MD;  Location: [facility] ENDOSCOPY;  Service: Cardiovascular;;  . IR ANGIO INTRA EXTRACRAN SEL INTERNAL CAROTID BILAT MOD SED  10/05/2019  . IR ANGIO VERTEBRAL SEL VERTEBRAL UNI R MOD SED  10/05/2019  . IR US GUIDE VASC ACCESS RIGHT  10/05/2019  . TEE WITHOUT CARDIOVERSION N/A 10/04/2019   Procedure: TRANSESOPHAGEAL ECHOCARDIOGRAM (TEE);  Surgeon: Chilton Si, MD;  Location: [facility] ENDOSCOPY;  Service: Cardiovascular;  Laterality: N/A;  . TUBAL LIGATION      There were no vitals filed for this visit.    Subjective Assessment - 01/14/21 1023    Subjective Pt reports having stroke in February 2021; sometimes leg feels it may give way.  The way I walk, it seems I stagger, like I might fall.  Hard to keep my balance.  Walk everyday for about 30 minutes around [place].   When I stand up too long, it feels like I might fall.  No falls in the past 6 monhts.    Pertinent History PMHx: HTN, DMT2, CVA 09/2019    Patient Stated Goals Want to help get my balance better and walk longer.    Currently in Pain? No/denies              [facility] PT Assessment - 01/14/21 1026      Assessment   Medical Diagnosis cerebral infarction    Referring Provider (PT) Emmie Niemann, NP    Onset Date/Surgical Date 01/01/21   MD order; CVA 09/2019   Hand Dominance Right    Prior Therapy 2021      Precautions   Precautions Fall      Balance Screen   Has the patient fallen in the past 6 months No    Has the patient had a decrease in activity level because of a fear of falling?  Yes    Is the patient reluctant to leave their home because of a fear of falling?  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, office cleaning, MD office correspondence    Leisure Enjoys walking in neighborhood, shopping, reading, computer work      Observation/Other Assessments   Focus on Therapeutic Outcomes (FOTO)  NA-pt is not acute CVA pt; her CVA was >1 yr ago in 09/2019      Sensation   Light Touch Appears Intact      Tone   Assessment Location Right Lower Extremity;Left Lower Extremity      ROM / Strength   AROM / PROM / Strength AROM;Strength      AROM   Overall AROM  Within functional limits for tasks performed       Strength   Overall Strength Deficits    Strength Assessment Site Hip;Knee;Ankle    Right/Left Hip Right;Left    Right Hip Flexion 3+/5    Left Hip Flexion 5/5    Right/Left Knee Right;Left    Right Knee Flexion 3+/5    Right Knee Extension 4/5    Left Knee Flexion 5/5    Left Knee Extension 5/5    Right/Left Ankle Right;Left    Right Ankle Dorsiflexion 4/5    Right Ankle Plantar Flexion 3-/5    Left Ankle Dorsiflexion 4+/5      Transfers   Transfers Sit to Stand;Stand to Sit    Sit to Stand 5: Supervision;Without upper extremity assist;From chair/3-in-1    Five time sit to stand comments  15.47    Stand to Sit 5: Supervision;Without upper extremity assist;To chair/3-in-1      Ambulation/Gait   Ambulation/Gait Yes    Ambulation/Gait Assistance 5: Supervision    Ambulation Distance (Feet) 200 Feet    Assistive device None    Gait Pattern Decreased arm swing - right;Decreased step length - right;Decreased stance time - right;Decreased weight shift to right;Wide base of support    Ambulation Surface Level;Indoor    Gait velocity 9.35 sec =3.51 ft/sec      Standardized Balance Assessment   Standardized Balance Assessment Timed Up and Go Test      Timed Up and Go Test   TUG Normal TUG    Normal TUG (seconds) 11.69      High Level Balance   High Level Balance Comments SLS:  RLE 1 sec, LLE 3 sec      Functional Gait  Assessment   Gait assessed  Yes    Gait Level Surface Walks 20 ft in less than 7 sec but greater than 5.5 sec, uses assistive device, slower speed, mild gait deviations, or deviates 6-10 in outside of the 12 in walkway width.   7.57   Change in Gait Speed Able to smoothly change walking speed without loss of balance or gait deviation. Deviate no more than 6 in outside of the 12 in walkway width.    Gait with Horizontal Head Turns Performs head turns smoothly with slight change in gait velocity (eg, minor disruption to smooth gait path), deviates 6-10 in outside 12  in walkway width, or uses an assistive device.   8.19   Gait with Vertical  Head Turns Performs task with slight change in gait velocity (eg, minor disruption to smooth gait path), deviates 6 - 10 in outside 12 in walkway width or uses assistive device    Gait and Pivot Turn Pivot turns safely within 3 sec and stops quickly with no loss of balance.    Step Over Obstacle Is able to step over one shoe box (4.5 in total height) but must slow down and adjust steps to clear box safely. May require verbal cueing.    Gait with Narrow Base of Support Ambulates less than 4 steps heel to toe or cannot perform without assistance.    Gait with Eyes Closed Walks 20 ft, slow speed, abnormal gait pattern, evidence for imbalance, deviates 10-15 in outside 12 in walkway width. Requires more than 9 sec to ambulate 20 ft.   10.31   Ambulating Backwards Walks 20 ft, no assistive devices, good speed, no evidence for imbalance, normal gait    Steps Two feet to a stair, must use rail.    Total Score 18    FGA comment: Scores <22/30 indicate increased fall risk.      RLE Tone   RLE Tone Mild      LLE Tone   LLE Tone Within Functional Limits                      Objective measurements completed on examination: See above findings.               PT Education - 01/14/21 1903    Education Details PT eval results, POC    Person(s) Educated Patient    Methods Explanation    Comprehension Verbalized understanding            PT Short Term Goals - 01/14/21 1915      PT SHORT TERM GOAL #1   Title Pt will be independent with HEP for improved strength, balance, transfers, and gait.  TARGET 02/15/2021    Time 5    Period Weeks    Status New      PT SHORT TERM GOAL #2   Title Pt will improve SLS to at least 5 seconds, BLEs, to demonstrate improved lower extremity strength for better balance.    Baseline RLE 1 sec, LLE 3 sec    Time 5    Period Weeks    Status New      PT SHORT TERM GOAL  #3   Title Pt will improve 5x sit<>stand to less than or equal to 12.5 sec for improved functional strength and transfer efficiency.    Baseline 15.47    Time 5    Period Weeks      PT SHORT TERM GOAL #4   Title Pt will negotiate 4 steps, ascending with step-through pattern, descending step-to pattern, with bilat rails, mod I, for improved stair negotiation.    Baseline step to pattern both up and down steps    Time 5    Period Weeks    Status New      PT SHORT TERM GOAL #5   Title Pt will verbalize understanding of fall prevention in home environment.    Time 5    Period Weeks    Status New             PT Long Term Goals - 01/15/21 0915      PT LONG TERM GOAL #1   Title Pt will be independent with HEP for improved strength,  balance, transfers, and gait.  TARGET all LTGs 03/01/2021    Time 7    Period Weeks    Status New      PT LONG TERM GOAL #2   Title Pt will improve FGA score to at least 22/30 to decrease fall risk.    Time 7    Period Weeks    Status New      PT LONG TERM GOAL #3   Title Pt will verbalize plans for ongoing community fitness to maximize functional gains made in PT.    Time 7    Period Weeks    Status New      PT LONG TERM GOAL #4   Title Pt will ascend and descend 4 steps, Bilat rails, step-through pattern, mod I for improved stair negotiation.    Time 7    Period Weeks    Status New                  Plan - 01/14/21 1909    Clinical Impression Statement Pt is a 55 year old female who presents to OPPT with dx of cerebral infarction, with pt reporting CVA was greater than 1 year ago, Feb. 2021.  She presents with decreased strength, decreased balance, decreased timing and coordination of gait.  She participated in therapies immediately following her CVA, and now returns to therapy desiring additional strength and better balance.  She is at fall risk per FGA score of 18/30 and she demo decreased functional lower extremity strength with 5x  sit<>stand score of 15.47 sec.  She was independent prior to initial CVA.  She would benefit from skilled PT to address the above stated deficits to improve overall functional moiblity and decrease fall risk.    Personal Factors and Comorbidities Comorbidity 3+    Comorbidities PMH HTN, DM, CVA    Examination-Activity Limitations Locomotion Level;Transfers;Dressing    Examination-Participation Restrictions Occupation;Community Activity;Shop    Stability/Clinical Decision Making Evolving/Moderate complexity    Clinical Decision Making Moderate    Rehab Potential Good    PT Frequency 2x / week    PT Duration 6 weeks   plus eval, = 7 weeks in POC   PT Treatment/Interventions ADLs/Self Care Home Management;Gait training;Functional mobility training;Therapeutic activities;Therapeutic exercise;Balance training;Neuromuscular re-education;DME Instruction;Manual techniques;Orthotic Fit/Training;Patient/family education;Electrical Stimulation    PT Next Visit Plan Initiate HEP:  RLE strengthening, NMR, weightbearing; gait training (?with cane as pt has order for cane from MD); balance exercises    Consulted and Agree with Plan of Care Patient           Patient will benefit from skilled therapeutic intervention in order to improve the following deficits and impairments:  Abnormal gait,Difficulty walking,Impaired tone,Decreased balance,Decreased strength,Decreased mobility  Visit Diagnosis: Other abnormalities of gait and mobility  Muscle weakness (generalized)  Unsteadiness on feet     Problem List Patient Active Problem List   Diagnosis Date Noted  . Embolic stroke (HCC) 10/03/2019  . Hypothyroidism (acquired)   . Hypertension   . Dyslipidemia   . Diabetes mellitus without complication (HCC)   . Intractable headache 09/11/2019  . Hypothyroidism 09/11/2019  . Type 2 diabetes mellitus without complication (HCC) 09/11/2019  . Acute CVA (cerebrovascular accident) (HCC) 09/11/2019  .  Elevated liver enzymes 09/11/2019    [place] Krotz W. 01/15/2021, 9:19 AM  Lonia Blood, PT 01/15/21 9:20 AM Phone: [phone] Fax: [phone]    [facility] [address] [place], [place], [address] Phone: [phone]  Fax:  [phone]  Name: Amy Raymond MRN: [id number] Date of Birth: 1965/11/19   Physician: Emmie Niemann, FNP  Certification Start Date: 01/14/2021 Certification End Date: 03/04/2021  Physician Documentation Your signature is required to indicate approval of the treatment plan as stated above.  Please sign and either send electronically or make a copy of this report for your files and return this physician signed original.  Please mark one 1.__approve of plan   2. ___approve of plan with the followingconditions. ____________________________________________________________________________________________________________________________________________   ______________________                                                       _____________________ Physician Signature                                                                     Date    Faxed to MD for signature

## 2021-01-28 ENCOUNTER — Other Ambulatory Visit: Payer: Self-pay

## 2021-01-28 ENCOUNTER — Ambulatory Visit: Payer: 59 | Admitting: Physical Therapy

## 2021-01-28 ENCOUNTER — Ambulatory Visit: Payer: 59

## 2021-01-28 DIAGNOSIS — R2689 Other abnormalities of gait and mobility: Secondary | ICD-10-CM | POA: Diagnosis not present

## 2021-01-28 DIAGNOSIS — M6281 Muscle weakness (generalized): Secondary | ICD-10-CM

## 2021-01-28 DIAGNOSIS — R471 Dysarthria and anarthria: Secondary | ICD-10-CM

## 2021-01-28 NOTE — Therapy (Signed)
[facility] [address] [place], [place], [address] Phone: [phone]   Fax:  [phone]  Physical Therapy Treatment  Patient Details  Name: Amy Raymond MRN: [id number] Date of Birth: 12-May-1966 Referring Provider (PT): Emmie Niemann, NP   Encounter Date: 01/28/2021 Pt has requested per 6/20 visit to decrease to 1x/wk due to insurance limitations.   PT End of Session - 01/28/21 0849     Visit Number 2    Number of Visits 13    Date for PT Re-Evaluation 03/04/21   week + 7 per new OP cert policy, email KP 01/04/21   Authorization Type [employer] (awaiting auth info)-pt reports insurance will only pay for once a week PT visits and will need to cx visits    PT Start Time 0847    PT Stop Time 0930    PT Time Calculation (min) 43 min    Activity Tolerance Patient tolerated treatment well    Behavior During Therapy [facility] for tasks assessed/performed             Past Medical History:  Diagnosis Date   CVA (cerebral vascular accident) (HCC)    09/11/19; 10/03/19   Diabetes mellitus without complication (HCC)    Dyslipidemia    Hypertension    Hypothyroidism (acquired)     Past Surgical History:  Procedure Laterality Date   BUBBLE STUDY  10/04/2019   Procedure: BUBBLE STUDY;  Surgeon: Chilton Si, MD;  Location: [facility] ENDOSCOPY;  Service: Cardiovascular;;   IR ANGIO INTRA EXTRACRAN SEL INTERNAL CAROTID BILAT MOD SED  10/05/2019   IR ANGIO VERTEBRAL SEL VERTEBRAL UNI R MOD SED  10/05/2019   IR US GUIDE VASC ACCESS RIGHT  10/05/2019   TEE WITHOUT CARDIOVERSION N/A 10/04/2019   Procedure: TRANSESOPHAGEAL ECHOCARDIOGRAM (TEE);  Surgeon: Chilton Si, MD;  Location: [facility] ENDOSCOPY;  Service: Cardiovascular;  Laterality: N/A;   TUBAL LIGATION      There were no vitals filed for this visit.   Subjective Assessment - 01/28/21 0847     Subjective Walked around the cul-de-sac with son yesterday, did it 3  times.  No falls.  Want to work up to walking distance so I can do it at the park.  Sometimes my leg feels weaker/stiffer in the morning.    Pertinent History PMHx: HTN, DMT2, CVA 09/2019    Patient Stated Goals Want to help get my balance better and walk longer.    Currently in Pain? No/denies                               [facility] Adult PT Treatment/Exercise - 01/28/21 0001       High Level Balance   High Level Balance Activities Backward walking    High Level Balance Comments Forward/back walk in parallel bars; heel walking x 2 in parallel bars      Exercises   Exercises Knee/Hip;Ankle      Knee/Hip Exercises: Stretches   Active Hamstring Stretch Right;Left;3 reps;30 seconds      Knee/Hip Exercises: Aerobic   Stepper Seated Stepper, Level 1.4, 4 extremities, x 5 minutes for leg strengthening      Knee/Hip Exercises: Standing   Knee Flexion Strengthening;Right;10 reps;Limitations    Knee Flexion Limitations 2# weight    Terminal Knee Extension Strengthening;Right;2 sets;10 reps;Theraband    Theraband Level (Terminal Knee Extension) Level 3 (Green)    Hip Extension Stengthening;Right;1 set;10 reps;Knee straight;Limitations  Extension Limitations 2# weight    Forward Step Up Both;Right;2 sets;10 reps;Hand Hold: 2;Step Height: 6";Left    Forward Step Up Limitations Forward step up/up, down/down, then single limb step up, 10 reps each      Knee/Hip Exercises: Seated   Other Seated Knee/Hip Exercises Seated ankle pumps 10 reps, 2 sec hold    Sit to Sand 2 sets;5 reps;without UE support   RLE tucked posteriorly for increased weightbearing through RLE.     Knee/Hip Exercises: Supine   Bridges Strengthening;Both;1 set;10 reps    Other Supine Knee/Hip Exercises Hooklying marching x 10 reps      Ankle Exercises: Standing   Heel Raises Both;10 reps;3 seconds    Toe Raise 10 reps;3 seconds    Other Standing Ankle Exercises Stagger stance fowrard/back rocking for  active ankle plantar/dorsiflexion 10 reps    Other Standing Ankle Exercises Single limb heel raises, RLE x 10 reps                    PT Education - 01/28/21 0925     Education Details HEP additions-see instructions    Person(s) Educated Patient    Methods Explanation;Demonstration;Handout    Comprehension Verbalized understanding;Returned demonstration              PT Short Term Goals - 01/14/21 1915       PT SHORT TERM GOAL #1   Title Pt will be independent with HEP for improved strength, balance, transfers, and gait.  TARGET 02/15/2021    Time 5    Period Weeks    Status New      PT SHORT TERM GOAL #2   Title Pt will improve SLS to at least 5 seconds, BLEs, to demonstrate improved lower extremity strength for better balance.    Baseline RLE 1 sec, LLE 3 sec    Time 5    Period Weeks    Status New      PT SHORT TERM GOAL #3   Title Pt will improve 5x sit<>stand to less than or equal to 12.5 sec for improved functional strength and transfer efficiency.    Baseline 15.47    Time 5    Period Weeks      PT SHORT TERM GOAL #4   Title Pt will negotiate 4 steps, ascending with step-through pattern, descending step-to pattern, with bilat rails, mod I, for improved stair negotiation.    Baseline step to pattern both up and down steps    Time 5    Period Weeks    Status New      PT SHORT TERM GOAL #5   Title Pt will verbalize understanding of fall prevention in home environment.    Time 5    Period Weeks    Status New               PT Long Term Goals - 01/15/21 0915       PT LONG TERM GOAL #1   Title Pt will be independent with HEP for improved strength, balance, transfers, and gait.  TARGET all LTGs 03/01/2021    Time 7    Period Weeks    Status New      PT LONG TERM GOAL #2   Title Pt will improve FGA score to at least 22/30 to decrease fall risk.    Time 7    Period Weeks    Status New      PT LONG TERM GOAL #3  Title Pt will verbalize  plans for ongoing community fitness to maximize functional gains made in PT.    Time 7    Period Weeks    Status New      PT LONG TERM GOAL #4   Title Pt will ascend and descend 4 steps, Bilat rails, step-through pattern, mod I for improved stair negotiation.    Time 7    Period Weeks    Status New                   Plan - 01/28/21 0865     Clinical Impression Statement HEP initiated this visit to address flexibility and strengthening, for exercises pt can do early in the morning to address stiffness she reports.  Also worked on standing, weightbearing exercises for RLE strengthening.  Pt with difficulty with single limb heel raises, with decreased strength noted in R gastrocs.  She will continue to benefit from skilled PT to further address balance, strength, gait towards goals.    Personal Factors and Comorbidities Comorbidity 3+    Comorbidities PMH HTN, DM, CVA    Examination-Activity Limitations Locomotion Level;Transfers;Dressing    Examination-Participation Restrictions Occupation;Community Activity;Shop    Stability/Clinical Decision Making Evolving/Moderate complexity    Rehab Potential Good    PT Frequency 2x / week    PT Duration 6 weeks   plus eval, = 7 weeks in POC   PT Treatment/Interventions ADLs/Self Care Home Management;Gait training;Functional mobility training;Therapeutic activities;Therapeutic exercise;Balance training;Neuromuscular re-education;DME Instruction;Manual techniques;Orthotic Fit/Training;Patient/family education;Electrical Stimulation    PT Next Visit Plan Review HEP and continue  RLE strengthening, NMR, weightbearing; gait training (?with cane as pt has order for cane from MD); balance exercises    Consulted and Agree with Plan of Care Patient            Pt has requested per 6/20 visit to decrease to 1x/wk due to insurance limitations.  Patient will benefit from skilled therapeutic intervention in order to improve the following deficits  and impairments:  Abnormal gait, Difficulty walking, Impaired tone, Decreased balance, Decreased strength, Decreased mobility  Visit Diagnosis: Muscle weakness (generalized)     Problem List Patient Active Problem List   Diagnosis Date Noted   Embolic stroke (HCC) 10/03/2019   Hypothyroidism (acquired)    Hypertension    Dyslipidemia    Diabetes mellitus without complication (HCC)    Intractable headache 09/11/2019   Hypothyroidism 09/11/2019   Type 2 diabetes mellitus without complication (HCC) 09/11/2019   Acute CVA (cerebrovascular accident) (HCC) 09/11/2019   Elevated liver enzymes 09/11/2019    Jon Kasparek W. 01/28/2021, 9:28 AM Gean Maidens., PT  [facility] [address] [place], [place], [address] Phone: [phone]   Fax:  [phone]  Name: Amy Raymond MRN: [id number] Date of Birth: 02-06-1966

## 2021-01-28 NOTE — Patient Instructions (Signed)
Access Code: [email or web address] URL: [email or web address] Date: 01/28/2021 Prepared by: Lonia Blood  Exercises Supine Bridge - 1 x daily - 5 x weekly - 2 sets - 10 reps Supine March - 1 x daily - 5 x weekly - 2 sets - 10 reps Seated Hamstring Stretch - 1-2 x daily - 7 x weekly - 1 sets - 3 reps - 30 sec hold Sit to stand in stride stance - 1 x daily - 5 x weekly - 2 sets - 10 reps Seated Toe Raise - 1-2 x daily - 7 x weekly - 2 sets - 10 reps - 3 sec hold

## 2021-01-28 NOTE — Therapy (Signed)
[facility] [address] [place], [place], [address] Phone: [phone]   Fax:  [phone]  Speech Language Pathology Treatment  Patient Details  Name: Amy Raymond MRN: [id number] Date of Birth: 11/03/1965 Referring Provider (SLP): Annia Belt, FNP (PCP (doc)- [place], Julie M NP)   Encounter Date: 01/28/2021   End of Session - 01/28/21 0955     Visit Number 2    Number of Visits 9    Date for SLP Re-Evaluation 02/25/21    Authorization Type [employer]    SLP Start Time 0803    SLP Stop Time  0845    SLP Time Calculation (min) 42 min    Activity Tolerance Patient tolerated treatment well             Past Medical History:  Diagnosis Date   CVA (cerebral vascular accident) (HCC)    09/11/19; 10/03/19   Diabetes mellitus without complication (HCC)    Dyslipidemia    Hypertension    Hypothyroidism (acquired)     Past Surgical History:  Procedure Laterality Date   BUBBLE STUDY  10/04/2019   Procedure: BUBBLE STUDY;  Surgeon: Chilton Si, MD;  Location: [facility] ENDOSCOPY;  Service: Cardiovascular;;   IR ANGIO INTRA EXTRACRAN SEL INTERNAL CAROTID BILAT MOD SED  10/05/2019   IR ANGIO VERTEBRAL SEL VERTEBRAL UNI R MOD SED  10/05/2019   IR US GUIDE VASC ACCESS RIGHT  10/05/2019   TEE WITHOUT CARDIOVERSION N/A 10/04/2019   Procedure: TRANSESOPHAGEAL ECHOCARDIOGRAM (TEE);  Surgeon: Chilton Si, MD;  Location: [facility] ENDOSCOPY;  Service: Cardiovascular;  Laterality: N/A;   TUBAL LIGATION      There were no vitals filed for this visit.   Subjective Assessment - 01/28/21 0805     Subjective "I have been working every morning."    Currently in Pain? No/denies                   ADULT SLP TREATMENT - 01/28/21 0806       General Information   Behavior/Cognition Alert;Cooperative;Pleasant mood      Treatment Provided   Treatment provided Cognitive-Linquistic       Cognitive-Linquistic Treatment   Treatment focused on Dysarthria    Skilled Treatment SLP reviewed pt's goals with her and she agreed these are what she desires to work on. SLP reviewed muscular action of abdominal breathing with pt and observed pt skillfully for 2 minutes. Abdominal movement was mostly accurate however pt's breath cycle was very shallow. SLP targeted this mostly unsuccessfully with pt, using visual cues of hand on abdominal musculature and verbal cues of "in-in-in  - - out". After 7 minutes SLP told pt to cont to practice the correct abdominal movement at home and we will work on extending breath in session. SLP worked with pt on her home exercises - pt demonstrated weaker reps after rep #2-3, consistently; pt appeared to have most difficulty with labial strength. Pt assured SLP she was performing this HEP BID. SLP encouraged pt to cont with this practice.      Assessment / Recommendations / Plan   Plan Continue with current plan of care      Progression Toward Goals   Progression toward goals Progressing toward goals              SLP Education - 01/28/21 0859     Education Details longer breath cycle needed with AB    Person(s) Educated Patient    Methods Explanation;Verbal  cues;Demonstration    Comprehension Verbalized understanding;Returned demonstration;Verbal cues required;Need further instruction                SLP Long Term Goals - 01/28/21 0958       SLP LONG TERM GOAL #1   Title Pt will complete dysarthria HEP with rare min A for 3 sessions    Time 4    Period Weeks    Status On-going      SLP LONG TERM GOAL #2   Title Pt will generate abdominal breathing in 16/20 answers to SLP questions over 2 sessions    Time 4    Period Weeks    Status On-going      SLP LONG TERM GOAL #3   Title Pt will demonstrate dysarthria compensations in 10-15 minute mod complex conversation with occasional min A over 2 sessions    Time 4    Period Weeks    Status  On-going      SLP LONG TERM GOAL #4   Title Pt will report improved communication effectiveness via PROM by last ST session    Baseline CES: 24 & CSF:19    Time 4    Period Weeks    Status On-going              Plan - 01/28/21 0955     Clinical Impression Statement Amy Raymond was referred for OPST evaluation to assess communication secondary to multiple strokes - most recent of which was March 2021.Speech intelligiblity cont'd to appear impacted by rate of speech and lack of adequate breath support with pt exhibiting SOB after speaking. SLP worked with pt extending breath cycle using abdominal breathing this session and reviewed her HEP. Pt would benefit from skilled ST intervention to address residual dysarthria to maximize communication effectiveness as it has impacted her ability to return to work and reduced her functional independence. Pt told SLP today that due to insurance she needs to modify her visits to once/week.    Speech Therapy Frequency 2x / week    Duration 4 weeks    Treatment/Interventions Oral motor exercises;Cueing hierarchy;Functional tasks;Multimodal communcation approach;Language facilitation;Compensatory techniques;Internal/external aids;SLP instruction and feedback    Potential to Achieve Goals Fair    Potential Considerations Ability to learn/carryover information;Previous level of function    SLP Home Exercise Plan provided    Consulted and Agree with Plan of Care Patient             Patient will benefit from skilled therapeutic intervention in order to improve the following deficits and impairments:   Dysarthria and anarthria    Problem List Patient Active Problem List   Diagnosis Date Noted   Embolic stroke (HCC) 10/03/2019   Hypothyroidism (acquired)    Hypertension    Dyslipidemia    Diabetes mellitus without complication (HCC)    Intractable headache 09/11/2019   Hypothyroidism 09/11/2019   Type 2 diabetes mellitus without complication (HCC)  09/11/2019   Acute CVA (cerebrovascular accident) (HCC) 09/11/2019   Elevated liver enzymes 09/11/2019    Alden [email or web address]  01/28/2021, 9:59 AM  [facility] [address] [place], [place], [address] Phone: [phone]   Fax:  [phone]   Name: Amy Raymond MRN: [id number] Date of Birth: [birth year]

## 2021-01-31 ENCOUNTER — Ambulatory Visit: Payer: 59

## 2021-01-31 ENCOUNTER — Ambulatory Visit: Payer: 59 | Admitting: Physical Therapy

## 2021-02-04 ENCOUNTER — Encounter: Payer: Self-pay | Admitting: Physical Therapy

## 2021-02-04 ENCOUNTER — Ambulatory Visit: Payer: 59

## 2021-02-04 ENCOUNTER — Ambulatory Visit: Payer: 59 | Admitting: Physical Therapy

## 2021-02-04 ENCOUNTER — Other Ambulatory Visit: Payer: Self-pay

## 2021-02-04 DIAGNOSIS — R2689 Other abnormalities of gait and mobility: Secondary | ICD-10-CM

## 2021-02-04 DIAGNOSIS — M6281 Muscle weakness (generalized): Secondary | ICD-10-CM

## 2021-02-04 DIAGNOSIS — R471 Dysarthria and anarthria: Secondary | ICD-10-CM

## 2021-02-04 NOTE — Therapy (Signed)
[facility] [address] [place], [place], [address] Phone: [phone]   Fax:  [phone]  Speech Language Pathology Treatment  Patient Details  Name: Amy Raymond MRN: [id number] Date of Birth: 1966-06-01 Referring Provider (SLP): Annia Belt, FNP (PCP (doc)- [place], Julie M NP)   Encounter Date: 02/04/2021   End of Session - 02/04/21 1533     Visit Number 3    Number of Visits 9    Date for SLP Re-Evaluation 02/25/21    Authorization Type [employer]    SLP Start Time 0805    SLP Stop Time  0847    SLP Time Calculation (min) 42 min    Activity Tolerance Patient tolerated treatment well             Past Medical History:  Diagnosis Date   CVA (cerebral vascular accident) (HCC)    09/11/19; 10/03/19   Diabetes mellitus without complication (HCC)    Dyslipidemia    Hypertension    Hypothyroidism (acquired)     Past Surgical History:  Procedure Laterality Date   BUBBLE STUDY  10/04/2019   Procedure: BUBBLE STUDY;  Surgeon: Chilton Si, MD;  Location: [facility] ENDOSCOPY;  Service: Cardiovascular;;   IR ANGIO INTRA EXTRACRAN SEL INTERNAL CAROTID BILAT MOD SED  10/05/2019   IR ANGIO VERTEBRAL SEL VERTEBRAL UNI R MOD SED  10/05/2019   IR US GUIDE VASC ACCESS RIGHT  10/05/2019   TEE WITHOUT CARDIOVERSION N/A 10/04/2019   Procedure: TRANSESOPHAGEAL ECHOCARDIOGRAM (TEE);  Surgeon: Chilton Si, MD;  Location: [facility] ENDOSCOPY;  Service: Cardiovascular;  Laterality: N/A;   TUBAL LIGATION      There were no vitals filed for this visit.   Subjective Assessment - 02/04/21 0812     Subjective "I'm keeping up with the words you know, use my mirror."    Currently in Pain? No/denies                   ADULT SLP TREATMENT - 02/04/21 0813       General Information   Behavior/Cognition Alert;Cooperative;Pleasant mood      Cognitive-Linquistic Treatment   Treatment focused on Dysarthria     Skilled Treatment "I used to have to cut myself off after a while cuz I was out of breath, now my friends say I can talk for a lot longer." SLP worked with pt with her abdominal breathing (AB) - pt had 90% success with a comfortable breath but was holding breath - SLP specifically worked with pt on exhale immediately after inhale. Pt was 100% after approx 20 seconds after VC by SLP. SLP then moved into reading simple sentences and pt was at 90% AB success with conversational sentences. With yes/no answers, pt was 90% successful using AB. Pt expressed being very motivated for change.      Assessment / Recommendations / Plan   Plan Continue with current plan of care      Progression Toward Goals   Progression toward goals Progressing toward goals              SLP Education - 02/04/21 1532     Education Details abdominal breathing, breathing full cycle and not holding breath    Person(s) Educated Patient    Methods Explanation;Demonstration;Verbal cues    Comprehension Verbalized understanding;Returned demonstration;Verbal cues required;Need further instruction                SLP Long Term Goals - 02/04/21 1535  SLP LONG TERM GOAL #1   Title Pt will complete dysarthria HEP with rare min A for 3 sessions    Time 3    Period Weeks    Status On-going      SLP LONG TERM GOAL #2   Title Pt will generate abdominal breathing in 16/20 answers to SLP questions over 2 sessions    Baseline 02-04-21    Time 3    Period Weeks    Status On-going      SLP LONG TERM GOAL #3   Title Pt will demonstrate dysarthria compensations in 10-15 minute mod complex conversation with occasional min A over 2 sessions    Time 3    Period Weeks    Status On-going      SLP LONG TERM GOAL #4   Title Pt will report improved communication effectiveness via PROM by last ST session    Baseline CES: 24 & CSF:19    Time 3    Period Weeks    Status On-going              Plan - 02/04/21  1533     Clinical Impression Statement [name] was referred for improving her communication due to multiple strokes - most recent of which was March 2021.Speech intelligiblity was improved today from previous session with this SLP, and pt told SLP other family members have commented positively on her incr'd intelligibliity. Pt would cont to benefit from skilled ST intervention to address residual dysarthria to maximize communication effectiveness as it has impacted her ability to return to work and reduced her functional independence.    Speech Therapy Frequency 2x / week    Duration 4 weeks    Treatment/Interventions Oral motor exercises;Cueing hierarchy;Functional tasks;Multimodal communcation approach;Language facilitation;Compensatory techniques;Internal/external aids;SLP instruction and feedback    Potential to Achieve Goals Fair    Potential Considerations Ability to learn/carryover information;Previous level of function    SLP Home Exercise Plan provided    Consulted and Agree with Plan of Care Patient             Patient will benefit from skilled therapeutic intervention in order to improve the following deficits and impairments:   Dysarthria and anarthria    Problem List Patient Active Problem List   Diagnosis Date Noted   Embolic stroke (HCC) 10/03/2019   Hypothyroidism (acquired)    Hypertension    Dyslipidemia    Diabetes mellitus without complication (HCC)    Intractable headache 09/11/2019   Hypothyroidism 09/11/2019   Type 2 diabetes mellitus without complication (HCC) 09/11/2019   Acute CVA (cerebrovascular accident) (HCC) 09/11/2019   Elevated liver enzymes 09/11/2019    Amy Raymond ,MS, CCC-SLP  02/04/2021, 3:36 PM  [facility] [address] [place], [place], [address] Phone: [phone]   Fax:  [phone]   Name: Amy Raymond MRN: [id number] Date of Birth: 22-Mar-1966

## 2021-02-04 NOTE — Therapy (Signed)
[facility] [address] [place], [place], [address] Phone: [phone]   Fax:  [phone]  Physical Therapy Treatment  Patient Details  Name: Amy Raymond MRN: [id number] Date of Birth: Aug 10, 1966 Referring Provider (PT): Emmie Niemann, NP   Encounter Date: 02/04/2021   PT End of Session - 02/04/21 0852     Visit Number 3    Number of Visits 13    Date for PT Re-Evaluation 03/04/21   week + 7 per new OP cert policy, email KP 01/04/21   Authorization Type [employer] (awaiting auth info)-pt reports insurance will only pay for once a week PT visits and will need to cx visits    PT Start Time 0850    PT Stop Time 0930    PT Time Calculation (min) 40 min    Activity Tolerance Patient tolerated treatment well    Behavior During Therapy [facility] for tasks assessed/performed             Past Medical History:  Diagnosis Date   CVA (cerebral vascular accident) (HCC)    09/11/19; 10/03/19   Diabetes mellitus without complication (HCC)    Dyslipidemia    Hypertension    Hypothyroidism (acquired)     Past Surgical History:  Procedure Laterality Date   BUBBLE STUDY  10/04/2019   Procedure: BUBBLE STUDY;  Surgeon: Chilton Si, MD;  Location: [facility] ENDOSCOPY;  Service: Cardiovascular;;   IR ANGIO INTRA EXTRACRAN SEL INTERNAL CAROTID BILAT MOD SED  10/05/2019   IR ANGIO VERTEBRAL SEL VERTEBRAL UNI R MOD SED  10/05/2019   IR US GUIDE VASC ACCESS RIGHT  10/05/2019   TEE WITHOUT CARDIOVERSION N/A 10/04/2019   Procedure: TRANSESOPHAGEAL ECHOCARDIOGRAM (TEE);  Surgeon: Chilton Si, MD;  Location: [facility] ENDOSCOPY;  Service: Cardiovascular;  Laterality: N/A;   TUBAL LIGATION      There were no vitals filed for this visit.   Subjective Assessment - 02/04/21 0852     Subjective No changes since last week.  Haven't been walking as much outside due to the heat.    Pertinent History PMHx: HTN, DMT2, CVA 09/2019    Patient  Stated Goals Want to help get my balance better and walk longer.    Currently in Pain? No/denies                               [facility] Adult PT Treatment/Exercise - 02/04/21 0001       Transfers   Transfers Sit to Stand;Stand to Sit    Sit to Stand 5: Supervision;Without upper extremity assist;From bed    Stand to Sit 5: Supervision;Without upper extremity assist;To chair/3-in-1;To bed    Number of Reps 10 reps;1 set    Comments From elevated mat surface, no UE support, simulating bed height at home.      Knee/Hip Exercises: Aerobic   Stepper Seated Stepper, Level 1.8, 4 extremities, x 4 minutes, then 2 minutes legs only, for leg strengthening      Knee/Hip Exercises: Standing   Knee Flexion Strengthening;Right;10 reps;Limitations;2 sets    Knee Flexion Limitations 2# weight    Hip Flexion Stengthening;Right;2 sets;10 reps;Knee bent    Hip Flexion Limitations Marching in place, RLE 2# weight    Hip Abduction Stengthening;Right;1 set;10 reps;Knee straight    Abduction Limitations 2# weight    Hip Extension Stengthening;Right;1 set;10 reps;Knee straight;Limitations    Extension Limitations 2# weight    Forward  Step Up Both;Right;2 sets;10 reps;Hand Hold: 2;Step Height: 6";Left    Forward Step Up Limitations Forward step up/up, down/down, then single limb step up, 10 reps each; RLE 2# weight    Step Down Right;1 set;10 reps;Hand Hold: 2;Step Height: 6"      Knee/Hip Exercises: Seated   Sit to Sand 10 reps;1 set;with UE support   RLE tucked posteriorly, LLE standing on balance disk, for increased RLE weigthbearing.     Knee/Hip Exercises: Supine   Bridges with Clamshell Strengthening;Both;1 set;10 reps;Limitations   Cues for technique            STanding alternating heel raises, tactile cues posterior R knee to lessen R knee recurvatum  Access Code: [email or web address] URL: [email or web address] Date: 01/28/2021 Prepared by: Lonia Blood    Exercises-Reviewed HEP initiated last visit, with pt return demo understanding with min cues  Supine Bridge - 1 x daily - 5 x weekly - 2 sets - 10 reps Supine March - 1 x daily - 5 x weekly - 2 sets - 10 reps Seated Hamstring Stretch - 1-2 x daily - 7 x weekly - 1 sets - 3 reps - 30 sec hold-cues for technique Sit to stand in stride stance - 1 x daily - 5 x weekly - 2 sets - 10 reps-cues for technique Seated Toe Raise - 1-2 x daily - 7 x weekly - 2 sets - 10 reps - 3 sec hold       PT Short Term Goals - 01/14/21 1915       PT SHORT TERM GOAL #1   Title Pt will be independent with HEP for improved strength, balance, transfers, and gait.  TARGET 02/15/2021    Time 5    Period Weeks    Status New      PT SHORT TERM GOAL #2   Title Pt will improve SLS to at least 5 seconds, BLEs, to demonstrate improved lower extremity strength for better balance.    Baseline RLE 1 sec, LLE 3 sec    Time 5    Period Weeks    Status New      PT SHORT TERM GOAL #3   Title Pt will improve 5x sit<>stand to less than or equal to 12.5 sec for improved functional strength and transfer efficiency.    Baseline 15.47    Time 5    Period Weeks      PT SHORT TERM GOAL #4   Title Pt will negotiate 4 steps, ascending with step-through pattern, descending step-to pattern, with bilat rails, mod I, for improved stair negotiation.    Baseline step to pattern both up and down steps    Time 5    Period Weeks    Status New      PT SHORT TERM GOAL #5   Title Pt will verbalize understanding of fall prevention in home environment.    Time 5    Period Weeks    Status New               PT Long Term Goals - 01/15/21 0915       PT LONG TERM GOAL #1   Title Pt will be independent with HEP for improved strength, balance, transfers, and gait.  TARGET all LTGs 03/01/2021    Time 7    Period Weeks    Status New      PT LONG TERM GOAL #2   Title Pt will improve FGA score to at least 22/30 to  decrease fall  risk.    Time 7    Period Weeks    Status New      PT LONG TERM GOAL #3   Title Pt will verbalize plans for ongoing community fitness to maximize functional gains made in PT.    Time 7    Period Weeks    Status New      PT LONG TERM GOAL #4   Title Pt will ascend and descend 4 steps, Bilat rails, step-through pattern, mod I for improved stair negotiation.    Time 7    Period Weeks    Status New                   Plan - 02/04/21 2694     Clinical Impression Statement Skilled PT session today continued focus on strengthening through RLE, with pt needing tactile and verbal cues at times for increased control through RLE to avoid R knee recurvatum.  She was able to increase standing strengthening to 2 sets for most exercises and she demo understanding of HEP.  She will continue to benefit from skilled PT to address strength, balance, gait training towards goals of improved overall functional mobility.    Personal Factors and Comorbidities Comorbidity 3+    Comorbidities PMH HTN, DM, CVA    Examination-Activity Limitations Locomotion Level;Transfers;Dressing    Examination-Participation Restrictions Occupation;Community Activity;Shop    Stability/Clinical Decision Making Evolving/Moderate complexity    Rehab Potential Good    PT Frequency 2x / week    PT Duration 6 weeks   plus eval, = 7 weeks in POC   PT Treatment/Interventions ADLs/Self Care Home Management;Gait training;Functional mobility training;Therapeutic activities;Therapeutic exercise;Balance training;Neuromuscular re-education;DME Instruction;Manual techniques;Orthotic Fit/Training;Patient/family education;Electrical Stimulation    PT Next Visit Plan Continue RLE strengthening, NMR, weightbearing; gait training (pt does not want to use cane); standing gastroc strengthening and gastroc stretching; balance exercises    Consulted and Agree with Plan of Care Patient             Patient will benefit from skilled  therapeutic intervention in order to improve the following deficits and impairments:  Abnormal gait, Difficulty walking, Impaired tone, Decreased balance, Decreased strength, Decreased mobility  Visit Diagnosis: Muscle weakness (generalized)  Other abnormalities of gait and mobility     Problem List Patient Active Problem List   Diagnosis Date Noted   Embolic stroke (HCC) 10/03/2019   Hypothyroidism (acquired)    Hypertension    Dyslipidemia    Diabetes mellitus without complication (HCC)    Intractable headache 09/11/2019   Hypothyroidism 09/11/2019   Type 2 diabetes mellitus without complication (HCC) 09/11/2019   Acute CVA (cerebrovascular accident) (HCC) 09/11/2019   Elevated liver enzymes 09/11/2019    Alec Jaros W. 02/04/2021, 9:28 AM Gean Maidens., PT   [facility] [address] [place], [place], [address] Phone: [phone]   Fax:  [phone]  Name: Amy Raymond MRN: [id number] Date of Birth: Jul 25, 1966

## 2021-02-07 ENCOUNTER — Ambulatory Visit: Payer: 59 | Admitting: Physical Therapy

## 2021-02-12 ENCOUNTER — Ambulatory Visit: Payer: 59 | Admitting: Physical Therapy

## 2021-02-12 ENCOUNTER — Ambulatory Visit: Payer: 59 | Attending: Neurology

## 2021-02-12 ENCOUNTER — Other Ambulatory Visit: Payer: Self-pay

## 2021-02-12 DIAGNOSIS — R2681 Unsteadiness on feet: Secondary | ICD-10-CM | POA: Insufficient documentation

## 2021-02-12 DIAGNOSIS — M6281 Muscle weakness (generalized): Secondary | ICD-10-CM | POA: Diagnosis present

## 2021-02-12 DIAGNOSIS — R2689 Other abnormalities of gait and mobility: Secondary | ICD-10-CM

## 2021-02-12 DIAGNOSIS — R471 Dysarthria and anarthria: Secondary | ICD-10-CM | POA: Insufficient documentation

## 2021-02-12 NOTE — Patient Instructions (Addendum)
Access Code: [email or web address] URL: [email or web address] Date: 02/12/2021 Prepared by: Lonia Blood  Exercises Seated Hamstring Stretch - 1-2 x daily - 7 x weekly - 1 sets - 3 reps - 30 sec hold Sit to stand in stride stance - 1 x daily - 5 x weekly - 2 sets - 10 reps Seated Toe Raise - 1-2 x daily - 7 x weekly - 2 sets - 10 reps - 3 sec hold  Additions 02/12/2021:  Heel rises with counter support - 1 x daily - 5 x weekly - 2-3 sets - 10 reps Staggered Stance Forward Backward Weight Shift with Counter Support - 1 x daily - 5 x weekly - 2-3 sets - 10 reps Standing Gastroc Stretch on Step - 1-2 x daily - 7 x weekly - 1 sets - 3 reps - 30 sec hold

## 2021-02-12 NOTE — Therapy (Signed)
[facility] [address] [place], [place], [address] Phone: [phone]   Fax:  [phone]  Physical Therapy Treatment  Patient Details  Name: Amy Raymond MRN: [id number] Date of Birth: October 21, 1965 Referring Provider (PT): Emmie Niemann, NP   Encounter Date: 02/12/2021   PT End of Session - 02/12/21 0841     Visit Number 4    Number of Visits 13    Date for PT Re-Evaluation 03/04/21   week + 7 per new OP cert policy, email KP 01/04/21   Authorization Type [employer] (VL 30 for PT); pt reports insurance will only pay for once a week PT visits and will need to cx visits    Authorization - Visit Number 3    Authorization - Number of Visits 30    PT Start Time 0846    PT Stop Time 0932    PT Time Calculation (min) 46 min    Equipment Utilized During Treatment Gait belt    Activity Tolerance Patient tolerated treatment well    Behavior During Therapy [facility] for tasks assessed/performed             Past Medical History:  Diagnosis Date   CVA (cerebral vascular accident) (HCC)    09/11/19; 10/03/19   Diabetes mellitus without complication (HCC)    Dyslipidemia    Hypertension    Hypothyroidism (acquired)     Past Surgical History:  Procedure Laterality Date   BUBBLE STUDY  10/04/2019   Procedure: BUBBLE STUDY;  Surgeon: Chilton Si, MD;  Location: [facility] ENDOSCOPY;  Service: Cardiovascular;;   IR ANGIO INTRA EXTRACRAN SEL INTERNAL CAROTID BILAT MOD SED  10/05/2019   IR ANGIO VERTEBRAL SEL VERTEBRAL UNI R MOD SED  10/05/2019   IR US GUIDE VASC ACCESS RIGHT  10/05/2019   TEE WITHOUT CARDIOVERSION N/A 10/04/2019   Procedure: TRANSESOPHAGEAL ECHOCARDIOGRAM (TEE);  Surgeon: Chilton Si, MD;  Location: [facility] ENDOSCOPY;  Service: Cardiovascular;  Laterality: N/A;   TUBAL LIGATION      There were no vitals filed for this visit.   Subjective Assessment - 02/12/21 0842     Subjective No changes since last  week.  Had some muscle pain after the visit last week.  Have a continuous glucose monitor and when it's on the right side, it doesn't register.  I've let the doctor know.    Pertinent History PMHx: HTN, DMT2, CVA 09/2019    Patient Stated Goals Want to help get my balance better and walk longer.    Currently in Pain? No/denies                               [facility] Adult PT Treatment/Exercise - 02/12/21 0850       Transfers   Transfers Sit to Stand;Stand to Sit    Sit to Stand 5: Supervision;Without upper extremity assist;From bed    Stand to Sit 5: Supervision;Without upper extremity assist;To chair/3-in-1;To bed    Number of Reps 10 reps;2 sets    Comments From mat surface, RLE tucked posteriorly x 10 reps.  2nd set LLE on balance disk x 10 reps, for increased RLE weightbearing.      Ambulation/Gait   Ambulation/Gait Yes    Ambulation/Gait Assistance 5: Supervision    Ambulation/Gait Assistance Details see below    Ambulation Distance (Feet) 115 Feet    Assistive device None    Gait Pattern Decreased arm swing -  right;Decreased step length - right;Decreased stance time - right;Decreased weight shift to right;Wide base of support   decreased RLE heelstrike   Gait Comments Gait x 115 ft with pt's hands at PT shoulders in front of patient to help with weightshifting for improved stance time RLE and for improved push-off at end of stance phase.  Cues for slowed pace.  Transitioned to additional short distance gait x 2, wtih no device and supervision, to reinforce pattern.      High Level Balance   High Level Balance Activities Other (comment)    High Level Balance Comments Forward/back walking along counter, x 2 reps, focus on heel>toe heelstrike/push-off with forward gait.      Knee/Hip Exercises: Aerobic   Stepper Seated Stepper, Level 1.8, 6 minutes, then lower extremities only, for leg strengthening.  Cues to pay attention to R foot flat and RLE use throughout.  Pt  keeps speed between 85-95 steps/min.      Knee/Hip Exercises: Standing   Knee Flexion Strengthening;Right;10 reps;Limitations;2 sets    Knee Flexion Limitations 2# weight    Hip Abduction Stengthening;Right;1 set;10 reps;Knee straight;Left    Abduction Limitations 2# weight, cues for technique, for upright trunk    Hip Extension Stengthening;Right;1 set;10 reps;Knee straight;Limitations;Left    Extension Limitations 2# weight; cues for technique, for upright trunk    Forward Step Up Right;2 sets;10 reps;Hand Hold: 2;Step Height: 6"    Forward Step Up Limitations single limb step up, 10 reps x 2 sets; RLE 2# weight    Step Down Right;1 set;10 reps;Hand Hold: 2;Step Height: 6"    Functional Squat 1 set;10 reps;Limitations    Functional Squat Limitations Standing on Airex      Ankle Exercises: Stretches   Gastroc Stretch 2 reps;30 seconds;Limitations   foot propped at 4" step     Ankle Exercises: Standing   Heel Raises Both;10 reps;3 seconds    Toe Raise Limitations   Attempted RLE only x 3 reps, did not complete due to R knee recurvatum   Other Standing Ankle Exercises Stagger stance fowrard/back rocking for active ankle plantar/dorsiflexion 10 reps    Other Standing Ankle Exercises Alternating limb heel raises, RLE and LLE x 10 reps.  Cues for RLE to control descent back to foot flat.                    PT Education - 02/12/21 1113     Education Details Additions to HEP; gait mechanics with attention to R heelstrike and R push-off with gait    Person(s) Educated Patient    Methods Explanation;Demonstration;Handout    Comprehension Verbalized understanding;Returned demonstration;Verbal cues required              PT Short Term Goals - 01/14/21 1915       PT SHORT TERM GOAL #1   Title Pt will be independent with HEP for improved strength, balance, transfers, and gait.  TARGET 02/15/2021    Time 5    Period Weeks    Status New      PT SHORT TERM GOAL #2   Title Pt  will improve SLS to at least 5 seconds, BLEs, to demonstrate improved lower extremity strength for better balance.    Baseline RLE 1 sec, LLE 3 sec    Time 5    Period Weeks    Status New      PT SHORT TERM GOAL #3   Title Pt will improve 5x sit<>stand to less than  or equal to 12.5 sec for improved functional strength and transfer efficiency.    Baseline 15.47    Time 5    Period Weeks      PT SHORT TERM GOAL #4   Title Pt will negotiate 4 steps, ascending with step-through pattern, descending step-to pattern, with bilat rails, mod I, for improved stair negotiation.    Baseline step to pattern both up and down steps    Time 5    Period Weeks    Status New      PT SHORT TERM GOAL #5   Title Pt will verbalize understanding of fall prevention in home environment.    Time 5    Period Weeks    Status New               PT Long Term Goals - 01/15/21 0915       PT LONG TERM GOAL #1   Title Pt will be independent with HEP for improved strength, balance, transfers, and gait.  TARGET all LTGs 03/01/2021    Time 7    Period Weeks    Status New      PT LONG TERM GOAL #2   Title Pt will improve FGA score to at least 22/30 to decrease fall risk.    Time 7    Period Weeks    Status New      PT LONG TERM GOAL #3   Title Pt will verbalize plans for ongoing community fitness to maximize functional gains made in PT.    Time 7    Period Weeks    Status New      PT LONG TERM GOAL #4   Title Pt will ascend and descend 4 steps, Bilat rails, step-through pattern, mod I for improved stair negotiation.    Time 7    Period Weeks    Status New                   Plan - 02/12/21 1114     Clinical Impression Statement Continued skilled PT sessionwith focus on strengthening/NMR through RLE.  Updated HEP for standing exercises and added gastroc stretch; pt does need cues for other exercises for technique and control, to avoid R knee recurvatum.  With focus on short distance gait  bouts on gait mechanics, pt is able to improve RLE weightshifting and stance time as well as push-off through R foot.  She will continue to benefit from further skilled PT to address strength, balance, gait for improved overall functional mobility.    Personal Factors and Comorbidities Comorbidity 3+    Comorbidities PMH HTN, DM, CVA    Examination-Activity Limitations Locomotion Level;Transfers;Dressing    Examination-Participation Restrictions Occupation;Community Activity;Shop    Stability/Clinical Decision Making Evolving/Moderate complexity    Rehab Potential Good    PT Frequency 2x / week    PT Duration 6 weeks   plus eval, = 7 weeks in POC   PT Treatment/Interventions ADLs/Self Care Home Management;Gait training;Functional mobility training;Therapeutic activities;Therapeutic exercise;Balance training;Neuromuscular re-education;DME Instruction;Manual techniques;Orthotic Fit/Training;Patient/family education;Electrical Stimulation    PT Next Visit Plan Check STGs.  Review HEP; continue RLE strengthening, NMR, weightbearing; gait training (slowed pace for increased RLE weightshifting); standing gastroc strengthening and gastroc stretching; balance exercises    Consulted and Agree with Plan of Care Patient             Patient will benefit from skilled therapeutic intervention in order to improve the following deficits and impairments:  Abnormal gait,  Difficulty walking, Impaired tone, Decreased balance, Decreased strength, Decreased mobility  Visit Diagnosis: Muscle weakness (generalized)  Other abnormalities of gait and mobility  Unsteadiness on feet     Problem List Patient Active Problem List   Diagnosis Date Noted   Embolic stroke (HCC) 10/03/2019   Hypothyroidism (acquired)    Hypertension    Dyslipidemia    Diabetes mellitus without complication (HCC)    Intractable headache 09/11/2019   Hypothyroidism 09/11/2019   Type 2 diabetes mellitus without complication (HCC)  09/11/2019   Acute CVA (cerebrovascular accident) (HCC) 09/11/2019   Elevated liver enzymes 09/11/2019    Alexandera Kuntzman W. 02/12/2021, 11:20 AM Gean Maidens., PT   [place] [facility] [address] [place], [place], [address] Phone: [phone]   Fax:  [phone]  Name: MARIEM SKOLNICK MRN: [id number] Date of Birth: Dec 11, 1965

## 2021-02-12 NOTE — Therapy (Signed)
[facility] [address] [place], [place], [address] Phone: [phone]   Fax:  [phone]  Speech Language Pathology Treatment  Patient Details  Name: Amy Raymond MRN: [id number] Date of Birth: January 29, 1966 Referring Provider (SLP): Annia Belt, FNP (PCP (doc)- [place], Julie M NP)   Encounter Date: 02/12/2021   End of Session - 02/12/21 1704     Visit Number 4    Number of Visits 9    Date for SLP Re-Evaluation 02/25/21    Authorization Type [employer]    SLP Start Time 0805    SLP Stop Time  0845    SLP Time Calculation (min) 40 min    Activity Tolerance Patient tolerated treatment well             Past Medical History:  Diagnosis Date   CVA (cerebral vascular accident) (HCC)    09/11/19; 10/03/19   Diabetes mellitus without complication (HCC)    Dyslipidemia    Hypertension    Hypothyroidism (acquired)     Past Surgical History:  Procedure Laterality Date   BUBBLE STUDY  10/04/2019   Procedure: BUBBLE STUDY;  Surgeon: Chilton Si, MD;  Location: [facility] ENDOSCOPY;  Service: Cardiovascular;;   IR ANGIO INTRA EXTRACRAN SEL INTERNAL CAROTID BILAT MOD SED  10/05/2019   IR ANGIO VERTEBRAL SEL VERTEBRAL UNI R MOD SED  10/05/2019   IR US GUIDE VASC ACCESS RIGHT  10/05/2019   TEE WITHOUT CARDIOVERSION N/A 10/04/2019   Procedure: TRANSESOPHAGEAL ECHOCARDIOGRAM (TEE);  Surgeon: Chilton Si, MD;  Location: [facility] ENDOSCOPY;  Service: Cardiovascular;  Laterality: N/A;   TUBAL LIGATION      There were no vitals filed for this visit.          ADULT SLP TREATMENT - 02/12/21 0809       General Information   Behavior/Cognition Alert;Cooperative;Pleasant mood      Treatment Provided   Treatment provided Cognitive-Linquistic      Cognitive-Linquistic Treatment   Treatment focused on Dysarthria    Skilled Treatment "I saw (family) at the 4th of July and got winded - I just put my hand on  my belly and my family said they could understand me!" Pt also described a situation in church where she read teh bulletin in front of congregation. Amy Raymond performed her dysarthria HEP with occasional min a to exaggerate her speech. In mod complex conversation today pt demonstrated the need for SLP min cues, rarely, for usage of overarticulation and abdominal breathing (AB).      Assessment / Recommendations / Plan   Plan Continue with current plan of care      Progression Toward Goals   Progression toward goals Progressing toward goals              SLP Education - 02/12/21 1704     Education Details important to transfer overarticulation and AB into conversational speech    Person(s) Educated Patient    Methods Explanation;Demonstration;Verbal cues    Comprehension Verbalized understanding;Returned demonstration;Verbal cues required;Need further instruction                SLP Long Term Goals - 02/12/21 1707       SLP LONG TERM GOAL #1   Title Pt will complete dysarthria HEP with rare min A for 3 sessions    Time 2    Period Weeks    Status On-going      SLP LONG TERM GOAL #2   Title Pt  will generate abdominal breathing in 16/20 answers to SLP questions over 2 sessions    Baseline 02-04-21, 02-12-21    Status Achieved      SLP LONG TERM GOAL #3   Title Pt will demonstrate dysarthria compensations in 10-15 minute mod complex conversation with occasional min A over 2 sessions    Time 2    Period Weeks    Status On-going      SLP LONG TERM GOAL #4   Title Pt will report improved communication effectiveness via PROM by last ST session    Baseline CES: 24 & CSF:19    Time 2    Period Weeks    Status On-going              Plan - 02/12/21 1705     Clinical Impression Statement Amy Raymond was referred for improving her communication due to multiple strokes - most recent of which was March 2021.Amy Raymond continues to experience positive interaction with others regarding her  speech - today reported her pastor and other family members have told her she is understood now, compared to prior to ST. However, pt is less-apt to use overarticulation and abdomial breathing (AB) when speaking on highly emotional topics like she did notday. She would benefit from  from cont'd skilled ST to address residual dysarthria to maximize communication effectiveness as it has impacted her ability to return to work and reduced her functional independence.    Speech Therapy Frequency 2x / week    Duration 4 weeks    Treatment/Interventions Oral motor exercises;Cueing hierarchy;Functional tasks;Multimodal communcation approach;Language facilitation;Compensatory techniques;Internal/external aids;SLP instruction and feedback    Potential to Achieve Goals Fair    Potential Considerations Ability to learn/carryover information;Previous level of function    SLP Home Exercise Plan provided    Consulted and Agree with Plan of Care Patient             Patient will benefit from skilled therapeutic intervention in order to improve the following deficits and impairments:   Dysarthria and anarthria    Problem List Patient Active Problem List   Diagnosis Date Noted   Embolic stroke (HCC) 10/03/2019   Hypothyroidism (acquired)    Hypertension    Dyslipidemia    Diabetes mellitus without complication (HCC)    Intractable headache 09/11/2019   Hypothyroidism 09/11/2019   Type 2 diabetes mellitus without complication (HCC) 09/11/2019   Acute CVA (cerebrovascular accident) (HCC) 09/11/2019   Elevated liver enzymes 09/11/2019    Amy Raymond ,MS, CCC-SLP  02/12/2021, 5:08 PM  [place] [facility] [address] [place], [place], [address] Phone: [phone]   Fax:  [phone]   Name: Amy Raymond MRN: [id number] Date of Birth: 18-Jan-1966

## 2021-02-15 ENCOUNTER — Ambulatory Visit: Payer: 59 | Admitting: Physical Therapy

## 2021-02-19 ENCOUNTER — Ambulatory Visit: Payer: 59 | Admitting: Physical Therapy

## 2021-02-22 ENCOUNTER — Ambulatory Visit: Payer: 59 | Admitting: Physical Therapy

## 2021-03-01 ENCOUNTER — Other Ambulatory Visit: Payer: Self-pay

## 2021-03-01 ENCOUNTER — Ambulatory Visit: Payer: 59

## 2021-03-01 ENCOUNTER — Ambulatory Visit: Payer: 59 | Admitting: Physical Therapy

## 2021-03-01 ENCOUNTER — Encounter: Payer: Self-pay | Admitting: Physical Therapy

## 2021-03-01 DIAGNOSIS — R2681 Unsteadiness on feet: Secondary | ICD-10-CM

## 2021-03-01 DIAGNOSIS — R471 Dysarthria and anarthria: Secondary | ICD-10-CM

## 2021-03-01 DIAGNOSIS — R2689 Other abnormalities of gait and mobility: Secondary | ICD-10-CM

## 2021-03-01 DIAGNOSIS — M6281 Muscle weakness (generalized): Secondary | ICD-10-CM

## 2021-03-01 NOTE — Therapy (Signed)
[facility] [address] [place], [place], [address] Phone: [phone]   Fax:  [phone]  Speech Language Pathology Treatment/Discharge-renewal  Patient Details  Name: Amy Raymond MRN: [id number] Date of Birth: Jan 25, 1966 Referring Provider (SLP): Melvenia Needles, [place] (PCP (doc)- [place], Julie M NP)   Encounter Date: 03/01/2021   End of Session - 03/01/21 0934     Visit Number 5    Number of Visits 9    Date for SLP Re-Evaluation 03/01/21    Authorization Type [employer]    SLP Start Time 0851    SLP Stop Time  0921    SLP Time Calculation (min) 30 min    Activity Tolerance Patient tolerated treatment well             Past Medical History:  Diagnosis Date   CVA (cerebral vascular accident) ([place])    09/11/19; 10/03/19   Diabetes mellitus without complication ([place])    Dyslipidemia    Hypertension    Hypothyroidism (acquired)     Past Surgical History:  Procedure Laterality Date   BUBBLE STUDY  10/04/2019   Procedure: BUBBLE STUDY;  Surgeon: Skeet Latch, MD;  Location: [place];  Service: Cardiovascular;;   IR ANGIO INTRA EXTRACRAN SEL INTERNAL CAROTID BILAT MOD SED  10/05/2019   IR ANGIO VERTEBRAL SEL VERTEBRAL UNI R MOD SED  10/05/2019   IR US GUIDE VASC ACCESS RIGHT  10/05/2019   TEE WITHOUT CARDIOVERSION N/A 10/04/2019   Procedure: TRANSESOPHAGEAL ECHOCARDIOGRAM (TEE);  Surgeon: Skeet Latch, MD;  Location: [place];  Service: Cardiovascular;  Laterality: N/A;   TUBAL LIGATION      There were no vitals filed for this visit.   SPEECH THERAPY Renewal-DISCHARGE SUMMARY  Visits from Start of Care: 5  Current functional level related to goals / functional outcomes: Pt has partially met or met goals. She is immensely pleased with her speech as it is currently.    Remaining deficits: Mild dysarthria.   Education / Equipment: Compensations for dysarthria.   Patient  agrees to discharge. Patient goals were partially met. Patient is being discharged due to being pleased with the current functional level..          ADULT SLP TREATMENT - 03/01/21 0927       General Information   Behavior/Cognition Alert;Cooperative;Pleasant mood      Treatment Provided   Treatment provided Cognitive-Linquistic      Cognitive-Linquistic Treatment   Treatment focused on Dysarthria    Skilled Treatment Pt entered ST room on her last scheduled appointment today sounding excellent, given her time post onset. She told SLP she read the scripture last Sunday in church and her pastor asked her if she wanted to say the prayer at a women's conference in the fall. She is very pleased with her progress as well as these requests to use her speech. She is comfortable with d/c today. Pt told SLP she does not feel hindered by her speech like she did at evaluation, and has confidence now when she talks, as she did not at time of eval.      Assessment / Recommendations / Plan   Plan Discharge SLP treatment due to (comment)      Progression Toward Goals   Progression toward goals --   d/c day- see note                 SLP Long Term Goals - 03/01/21 7482  SLP LONG TERM GOAL #1   Title Pt will complete dysarthria HEP with rare min A for 3 sessions    Baseline 03-01-21    Status Partially Met      SLP LONG TERM GOAL #2   Title Pt will generate abdominal breathing in 16/20 answers to SLP questions over 2 sessions    Baseline 02-04-21, 02-12-21    Status Achieved      SLP LONG TERM GOAL #3   Title Pt will demonstrate dysarthria compensations in 10-15 minute mod complex conversation with occasional min A over 2 sessions    Baseline 03-01-21    Status Partially Met      SLP LONG TERM GOAL #4   Title Pt will report improved communication effectiveness via PROM by last ST session    Baseline CES: 24 & CSF:19    Status Achieved              Plan - 03/01/21 0935      Clinical Impression Statement Amy Raymond was referred for improving her communication due to multiple strokes - most recent of which was March 2021.Amy Raymond has been asked to use her speech in front of others at church and this has incr'd her confidence tremendously. She talked out doors with SLP about emotional topics and was able to maintain slower rate with nonverbal cue x1 from SLP. She agrees with d/c today, and is extremely pleased with her speech presently.    Treatment/Interventions Oral motor exercises;Cueing hierarchy;Functional tasks;Multimodal communcation approach;Language facilitation;Compensatory techniques;Internal/external aids;SLP instruction and feedback    Potential to Achieve Goals Fair    Potential Considerations Ability to learn/carryover information;Previous level of function    SLP Home Exercise Plan provided    Consulted and Agree with Plan of Care Patient             Patient will benefit from skilled therapeutic intervention in order to improve the following deficits and impairments:   Dysarthria and anarthria    Problem List Patient Active Problem List   Diagnosis Date Noted   Embolic stroke ([place]) 90/21/1155   Hypothyroidism (acquired)    Hypertension    Dyslipidemia    Diabetes mellitus without complication ([place])    Intractable headache 09/11/2019   Hypothyroidism 09/11/2019   Type 2 diabetes mellitus without complication ([place]) 20/80/2233   Acute CVA (cerebrovascular accident) ([place]) 09/11/2019   Elevated liver enzymes 09/11/2019    Mikey Maffett ,[place], [place]  03/01/2021, 9:37 AM  [place] [address] [place], [place], [address] Phone: [phone]   Fax:  [phone]   Name: Amy Raymond MRN: [id number] Date of Birth: 01/09/1966

## 2021-03-01 NOTE — Therapy (Signed)
[facility] [address] [place], [place], [address] Phone: [phone]   Fax:  [phone]  Physical Therapy Treatment  Patient Details  Name: MARLIS OLDAKER MRN: [id number] Date of Birth: 1966/07/17 Referring Provider (PT): Dava Najjar, NP   Encounter Date: 03/01/2021   PT End of Session - 03/01/21 0935     Visit Number 5    Number of Visits 13    Date for PT Re-Evaluation 03/04/21   week + 7 per new OP cert policy, email KP 03/21/02   Authorization Type [employer] (VL 30 for PT); pt reports insurance will only pay for once a week PT visits and will need to cx visits    Authorization - Visit Number 4    Authorization - Number of Visits 30    PT Start Time 0933    PT Stop Time 1015    PT Time Calculation (min) 42 min    Equipment Utilized During Treatment Gait belt    Activity Tolerance Patient tolerated treatment well    Behavior During Therapy [facility] for tasks assessed/performed             Past Medical History:  Diagnosis Date   CVA (cerebral vascular accident) ([place])    09/11/19; 10/03/19   Diabetes mellitus without complication ([facility])    Dyslipidemia    Hypertension    Hypothyroidism (acquired)     Past Surgical History:  Procedure Laterality Date   BUBBLE STUDY  10/04/2019   Procedure: BUBBLE STUDY;  Surgeon: Skeet Latch, MD;  Location: [facility];  Service: Cardiovascular;;   IR ANGIO INTRA EXTRACRAN SEL INTERNAL CAROTID BILAT MOD SED  10/05/2019   IR ANGIO VERTEBRAL SEL VERTEBRAL UNI R MOD SED  10/05/2019   IR US GUIDE VASC ACCESS RIGHT  10/05/2019   TEE WITHOUT CARDIOVERSION N/A 10/04/2019   Procedure: TRANSESOPHAGEAL ECHOCARDIOGRAM (TEE);  Surgeon: Skeet Latch, MD;  Location: [facility];  Service: Cardiovascular;  Laterality: N/A;   TUBAL LIGATION      There were no vitals filed for this visit.   Subjective Assessment - 03/01/21 0935     Subjective No new complaints. No  falls or pain to report. HEP is going well with no issues.    Pertinent History PMHx: HTN, DMT2, CVA 09/2019    Patient Stated Goals Want to help get my balance better and walk longer.    Currently in Pain? No/denies                [facility] PT Assessment - 03/01/21 0937       Functional Gait  Assessment   Gait assessed  Yes    Gait Level Surface Walks 20 ft in less than 5.5 sec, no assistive devices, good speed, no evidence for imbalance, normal gait pattern, deviates no more than 6 in outside of the 12 in walkway width.   5.25 sec's   Change in Gait Speed Able to smoothly change walking speed without loss of balance or gait deviation. Deviate no more than 6 in outside of the 12 in walkway width.    Gait with Horizontal Head Turns Performs head turns smoothly with no change in gait. Deviates no more than 6 in outside 12 in walkway width    Gait with Vertical Head Turns Performs head turns with no change in gait. Deviates no more than 6 in outside 12 in walkway width.    Gait and Pivot Turn Pivot turns safely within 3 sec and stops  quickly with no loss of balance.    Step Over Obstacle Is able to step over 2 stacked shoe boxes taped together (9 in total height) without changing gait speed. No evidence of imbalance.    Gait with Narrow Base of Support Ambulates 4-7 steps.    Gait with Eyes Closed Walks 20 ft, no assistive devices, good speed, no evidence of imbalance, normal gait pattern, deviates no more than 6 in outside 12 in walkway width. Ambulates 20 ft in less than 7 sec.   6.31 sec's   Ambulating Backwards Walks 20 ft, no assistive devices, good speed, no evidence for imbalance, normal gait   8.16 sec's   Steps Alternating feet, must use rail.    Total Score 27    FGA comment: 27/30 25-28 low risk for falls                    [facility] Adult PT Treatment/Exercise - 03/01/21 0937       Transfers   Transfers Sit to Stand;Stand to Sit    Sit to Stand 6: Modified independent  (Device/Increase time)    Five time sit to stand comments  13.03 sec's no UE support from standard height surface    Stand to Sit 6: Modified independent (Device/Increase time)      Ambulation/Gait   Ambulation/Gait Yes    Ambulation/Gait Assistance 6: Modified independent (Device/Increase time)    Ambulation/Gait Assistance Details no balance issues noted    Ambulation Distance (Feet) 1000 Feet   x1, plus around clinic with session   Assistive device None    Gait Pattern Step-through pattern;Decreased arm swing - right;Decreased arm swing - left    Ambulation Surface Level;Unlevel;Indoor;Outdoor;Paved    Stairs Yes    Stairs Assistance 6: Modified independent (Device/Increase time)    Stair Management Technique One rail Right;Alternating pattern;Forwards    Number of Stairs 4    Height of Stairs 6      Self-Care   Self-Care Other Self-Care Comments    Other Self-Care Comments  updated HEP. Refer to [employer] for full details. No issues with HEP in session.; discussed fall prevention strategies for the home. Pt has already incorporated most of them. Handouts given.                 Balance Exercises - 03/01/21 0955       Balance Exercises: Standing   SLS Solid surface;4 reps;10 secs;Limitations    SLS Limitations no UE support for 4 reps on each leg. min guard assist for safety.            Issued the following to HEP today: Access Code: [email or web address] URL: [email or web address] Date: 03/01/2021 Prepared by: Sallyanne Kuster  Patient Education What You Can Do to Prevent Falls  Access Code: [email or web address] URL: [email or web address] Date: 03/01/2021 Prepared by: Sallyanne Kuster  Exercises Seated Hamstring Stretch - 1-2 x daily - 7 x weekly - 1 sets - 3 reps - 30 sec hold Sit to stand in stride stance - 1 x daily - 5 x weekly - 2 sets - 10 reps Standing Gastroc Stretch on Step - 1-2 x daily - 7 x weekly - 1 sets - 3 reps - 30 sec hold Heel Toe Raises with  Counter Support - 1 x daily - 5 x weekly - 1 sets - 10 reps Standing Tandem Balance with Counter Support - 1 x daily - 5 x weekly - 1 sets - 3 reps - 30 hold  PT Education - 03/01/21 1553     Education Details progress toward goals, updated HEP, plan for discharge this session with goals met    Person(s) Educated Patient    Methods Explanation;Demonstration;Verbal cues;Handout    Comprehension Verbalized understanding;Returned demonstration              PT Short Term Goals - 03/01/21 1555       PT SHORT TERM GOAL #1   Title Pt will be independent with HEP for improved strength, balance, transfers, and gait.  TARGET 02/15/2021    Baseline 03/01/21: met in session    Status Achieved      PT SHORT TERM GOAL #2   Title Pt will improve SLS to at least 5 seconds, BLEs, to demonstrate improved lower extremity strength for better balance.    Baseline 03/01/21: >/= 10 sec's bil LEs    Status Achieved      PT SHORT TERM GOAL #3   Title Pt will improve 5x sit<>stand to less than or equal to 12.5 sec for improved functional strength and transfer efficiency.    Baseline 03/01/21: 13.03 sec's no UE support, improved from 15.47 sec's just not to goal level    Status Partially Met      PT SHORT TERM GOAL #4   Title Pt will negotiate 4 steps, ascending with step-through pattern, descending step-to pattern, with bilat rails, mod I, for improved stair negotiation.    Baseline 03/01/21: step through pattern both ways with single rail    Status Achieved      PT SHORT TERM GOAL #5   Title Pt will verbalize understanding of fall prevention in home environment.    Baseline 03/01/21: met in session today    Status Achieved               PT Long Term Goals - 03/01/21 1557       PT LONG TERM GOAL #1   Title Pt will be independent with HEP for improved strength, balance, transfers, and gait.  TARGET all LTGs 03/01/2021    Baseline 03/01/21: met with advanced HEP this session    Status Achieved       PT LONG TERM GOAL #2   Title Pt will improve FGA score to at least 22/30 to decrease fall risk.    Baseline 03/01/21: 27/30 scored today    Status Achieved      PT LONG TERM GOAL #3   Title Pt will verbalize plans for ongoing community fitness to maximize functional gains made in PT.    Baseline 03/01/21: pt is walking with family and doing HEP.    Status Achieved      PT LONG TERM GOAL #4   Title Pt will ascend and descend 4 steps, Bilat rails, step-through pattern, mod I for improved stair negotiation.    Baseline 03/01/21: met with single rail support    Status Achieved                   Plan - 03/01/21 0936     Clinical Impression Statement Today's skilled session focused on progress toward goals with all STGs and LTGs met except for pt's 5 time sit to stand goal. Pt decreased her time to 13.03 sec's with no UE support, just not to goal level. Pt improved her FGA score to 27/30. Pt also now able to perform single leg stance for >/= 10 sec's on both legs with no UE support. Pt also able to verbalize  fall prevention strategies in the home. Remainder of session focused on advancement of pt's HEP with no issues noted or reported. Pt expected to be discharge today as she had no more appt's scheduled. Due to progress toward goals primary PT in agreement and pt to be discharged today.    Personal Factors and Comorbidities Comorbidity 3+    Comorbidities PMH HTN, DM, CVA    Examination-Activity Limitations Locomotion Level;Transfers;Dressing    Examination-Participation Restrictions Occupation;Community Activity;Shop    Stability/Clinical Decision Making Evolving/Moderate complexity    Rehab Potential Good    PT Frequency 2x / week    PT Duration 6 weeks   plus eval, = 7 weeks in POC   PT Treatment/Interventions ADLs/Self Care Home Management;Gait training;Functional mobility training;Therapeutic activities;Therapeutic exercise;Balance training;Neuromuscular re-education;DME  Instruction;Manual techniques;Orthotic Fit/Training;Patient/family education;Electrical Stimulation    PT Next Visit Plan discharge with goals met    Consulted and Agree with Plan of Care Patient             Patient will benefit from skilled therapeutic intervention in order to improve the following deficits and impairments:  Abnormal gait, Difficulty walking, Impaired tone, Decreased balance, Decreased strength, Decreased mobility  Visit Diagnosis: Muscle weakness (generalized)  Other abnormalities of gait and mobility  Unsteadiness on feet     Problem List Patient Active Problem List   Diagnosis Date Noted   Embolic stroke ([facility]) 19/37/9024   Hypothyroidism (acquired)    Hypertension    Dyslipidemia    Diabetes mellitus without complication ([place])    Intractable headache 09/11/2019   Hypothyroidism 09/11/2019   Type 2 diabetes mellitus without complication ([place]) 09/73/5329   Acute CVA (cerebrovascular accident) ([place]) 09/11/2019   Elevated liver enzymes 09/11/2019   Willow Ora, PTA, [facility] [address], [place], [place] [address] [phone] 03/01/21, 4:07 PM   Name: ELYSSE POLIDORE MRN: [id number] Date of Birth: February 12, 1966

## 2021-03-01 NOTE — Patient Instructions (Addendum)
Access Code: [email or web address] URL: [email or web address] Date: 03/01/2021 Prepared by: Sallyanne Kuster  Patient Education What You Can Do to Prevent Falls  Access Code: [email or web address] URL: [email or web address] Date: 03/01/2021 Prepared by: Sallyanne Kuster  Exercises Seated Hamstring Stretch - 1-2 x daily - 7 x weekly - 1 sets - 3 reps - 30 sec hold Sit to stand in stride stance - 1 x daily - 5 x weekly - 2 sets - 10 reps Standing Gastroc Stretch on Step - 1-2 x daily - 7 x weekly - 1 sets - 3 reps - 30 sec hold Heel Toe Raises with Counter Support - 1 x daily - 5 x weekly - 1 sets - 10 reps Standing Tandem Balance with Counter Support - 1 x daily - 5 x weekly - 1 sets - 3 reps - 30 hold

## 2021-03-04 ENCOUNTER — Encounter: Payer: Self-pay | Admitting: Physical Therapy

## 2021-03-04 NOTE — Therapy (Signed)
[facility] [address] [place], [place], [address] Phone: [phone]   Fax:  [phone]  Patient Details  Name: Amy Raymond MRN: [id number] Date of Birth: 1965/08/13 Referring Provider:  No ref. provider found  Encounter Date: 03/04/2021   PHYSICAL THERAPY DISCHARGE SUMMARY  Visits from Start of Care: 5  Current functional level related to goals / functional outcomes:  PT Long Term Goals - 03/01/21 1557       PT LONG TERM GOAL #1   Title Pt will be independent with HEP for improved strength, balance, transfers, and gait.  TARGET all LTGs 03/01/2021    Baseline 03/01/21: met with advanced HEP this session    Status Achieved      PT LONG TERM GOAL #2   Title Pt will improve FGA score to at least 22/30 to decrease fall risk.    Baseline 03/01/21: 27/30 scored today    Status Achieved      PT LONG TERM GOAL #3   Title Pt will verbalize plans for ongoing community fitness to maximize functional gains made in PT.    Baseline 03/01/21: pt is walking with family and doing HEP.    Status Achieved      PT LONG TERM GOAL #4   Title Pt will ascend and descend 4 steps, Bilat rails, step-through pattern, mod I for improved stair negotiation.    Baseline 03/01/21: met with single rail support    Status Achieved             PT Short Term Goals - 03/01/21 1555       PT SHORT TERM GOAL #1   Title Pt will be independent with HEP for improved strength, balance, transfers, and gait.  TARGET 02/15/2021    Baseline 03/01/21: met in session    Status Achieved      PT SHORT TERM GOAL #2   Title Pt will improve SLS to at least 5 seconds, BLEs, to demonstrate improved lower extremity strength for better balance.    Baseline 03/01/21: >/= 10 sec's bil LEs    Status Achieved      PT SHORT TERM GOAL #3   Title Pt will improve 5x sit<>stand to less than or equal to 12.5 sec for improved functional strength and transfer  efficiency.    Baseline 03/01/21: 13.03 sec's no UE support, improved from 15.47 sec's just not to goal level    Status Partially Met      PT SHORT TERM GOAL #4   Title Pt will negotiate 4 steps, ascending with step-through pattern, descending step-to pattern, with bilat rails, mod I, for improved stair negotiation.    Baseline 03/01/21: step through pattern both ways with single rail    Status Achieved      PT SHORT TERM GOAL #5   Title Pt will verbalize understanding of fall prevention in home environment.    Baseline 03/01/21: met in session today    Status Achieved            Pt has met/partially met all goals.   Remaining deficits: Decreased strength, balance-both improving    Education / Equipment: Educated in [employer], fall prevention.   Patient agrees to discharge. Patient goals were met. Patient is being discharged due to meeting the stated rehab goals.   Dayna Geurts W. 03/04/2021, 8:13 AM Frazier Butt., PT   [facility] [address] [place], [place], [address] Phone: [phone]   Fax:  [phone]

## 2021-03-07 ENCOUNTER — Ambulatory Visit: Payer: 59 | Admitting: Occupational Therapy

## 2021-11-01 ENCOUNTER — Other Ambulatory Visit: Payer: Self-pay

## 2021-11-01 ENCOUNTER — Encounter (HOSPITAL_COMMUNITY): Payer: Self-pay

## 2021-11-01 ENCOUNTER — Emergency Department (HOSPITAL_COMMUNITY)
Admission: EM | Admit: 2021-11-01 | Discharge: 2021-11-01 | Disposition: A | Discharge status: Home / Self Care | Payer: BLUE CROSS/BLUE SHIELD | Attending: Emergency Medicine | Admitting: Emergency Medicine

## 2021-11-01 ENCOUNTER — Emergency Department (HOSPITAL_COMMUNITY): Payer: BLUE CROSS/BLUE SHIELD

## 2021-11-01 DIAGNOSIS — R519 Headache, unspecified: Secondary | ICD-10-CM | POA: Insufficient documentation

## 2021-11-01 DIAGNOSIS — Z79899 Other long term (current) drug therapy: Secondary | ICD-10-CM | POA: Insufficient documentation

## 2021-11-01 DIAGNOSIS — Z7902 Long term (current) use of antithrombotics/antiplatelets: Secondary | ICD-10-CM | POA: Insufficient documentation

## 2021-11-01 DIAGNOSIS — I10 Essential (primary) hypertension: Secondary | ICD-10-CM | POA: Diagnosis not present

## 2021-11-01 DIAGNOSIS — D649 Anemia, unspecified: Secondary | ICD-10-CM | POA: Insufficient documentation

## 2021-11-01 DIAGNOSIS — E039 Hypothyroidism, unspecified: Secondary | ICD-10-CM | POA: Insufficient documentation

## 2021-11-01 DIAGNOSIS — Z7984 Long term (current) use of oral hypoglycemic drugs: Secondary | ICD-10-CM | POA: Insufficient documentation

## 2021-11-01 DIAGNOSIS — S0301XA Dislocation of jaw, right side, initial encounter: Secondary | ICD-10-CM | POA: Diagnosis not present

## 2021-11-01 DIAGNOSIS — S0300XA Dislocation of jaw, unspecified side, initial encounter: Secondary | ICD-10-CM

## 2021-11-01 DIAGNOSIS — E119 Type 2 diabetes mellitus without complications: Secondary | ICD-10-CM | POA: Insufficient documentation

## 2021-11-01 DIAGNOSIS — S0993XA Unspecified injury of face, initial encounter: Secondary | ICD-10-CM | POA: Diagnosis present

## 2021-11-01 DIAGNOSIS — X58XXXA Exposure to other specified factors, initial encounter: Secondary | ICD-10-CM | POA: Diagnosis not present

## 2021-11-01 LAB — CBC WITH DIFFERENTIAL/PLATELET
Abs Immature Granulocytes: 0.05 10*3/uL (ref 0.00–0.07)
Basophils Absolute: 0 10*3/uL (ref 0.0–0.1)
Basophils Relative: 0 %
Eosinophils Absolute: 0.4 10*3/uL (ref 0.0–0.5)
Eosinophils Relative: 4 %
HCT: 34.6 % — ABNORMAL LOW (ref 36.0–46.0)
Hemoglobin: 10.9 g/dL — ABNORMAL LOW (ref 12.0–15.0)
Immature Granulocytes: 1 %
Lymphocytes Relative: 28 %
Lymphs Abs: 2.6 10*3/uL (ref 0.7–4.0)
MCH: 23.2 pg — ABNORMAL LOW (ref 26.0–34.0)
MCHC: 31.5 g/dL (ref 30.0–36.0)
MCV: 73.6 fL — ABNORMAL LOW (ref 80.0–100.0)
Monocytes Absolute: 0.6 10*3/uL (ref 0.1–1.0)
Monocytes Relative: 7 %
Neutro Abs: 5.5 10*3/uL (ref 1.7–7.7)
Neutrophils Relative %: 60 %
Platelets: 385 10*3/uL (ref 150–400)
RBC: 4.7 MIL/uL (ref 3.87–5.11)
RDW: 16.1 % — ABNORMAL HIGH (ref 11.5–15.5)
WBC: 9.2 10*3/uL (ref 4.0–10.5)
nRBC: 0 % (ref 0.0–0.2)

## 2021-11-01 LAB — BASIC METABOLIC PANEL
Anion gap: 8 (ref 5–15)
BUN: 10 mg/dL (ref 6–20)
CO2: 24 mmol/L (ref 22–32)
Calcium: 8.8 mg/dL — ABNORMAL LOW (ref 8.9–10.3)
Chloride: 104 mmol/L (ref 98–111)
Creatinine, Ser: 0.84 mg/dL (ref 0.44–1.00)
GFR, Estimated: >60 mL/min (ref >60)
Glucose, Bld: 156 mg/dL — ABNORMAL HIGH (ref 70–99)
Potassium: 3.6 mmol/L (ref 3.5–5.1)
Sodium: 136 mmol/L (ref 135–145)

## 2021-11-01 LAB — CBG MONITORING, ED: Glucose-Capillary: 145 mg/dL — ABNORMAL HIGH (ref 70–99)

## 2021-11-01 MED ORDER — DIPHENHYDRAMINE HCL 50 MG/ML IJ SOLN
25.0000 mg | Freq: Once | INTRAMUSCULAR | Status: AC
  Administered 2021-11-01: 25 mg via INTRAVENOUS
  Filled 2021-11-01: qty 1

## 2021-11-01 MED ORDER — DIPHENHYDRAMINE HCL 50 MG/ML IJ SOLN: 12.5000 mg | Freq: Once | INTRAMUSCULAR | Status: DC

## 2021-11-01 MED ORDER — SODIUM CHLORIDE 0.9 % IV BOLUS
1000.0000 mL | Freq: Once | INTRAVENOUS | Status: AC
  Administered 2021-11-01: 1000 mL via INTRAVENOUS

## 2021-11-01 MED ORDER — DIAZEPAM 5 MG/ML IJ SOLN
5.0000 mg | Freq: Once | INTRAMUSCULAR | Status: AC
  Administered 2021-11-01: 5 mg via INTRAVENOUS
  Filled 2021-11-01: qty 2

## 2021-11-01 NOTE — ED Triage Notes (Addendum)
Pt reports she had a colonoscopy this morning and upon waking her mouth was pulled to the left side of her face. Upon exam equal grips, strength, and sensation. A&O x4. Pt endorses jaw pain at this time.  ?

## 2021-11-01 NOTE — ED Provider Notes (Signed)
[facility]Provider Note ? ? ?CSN: [id number] ?Arrival date & time: 11/01/21  1200 ? ?  ? ?History ? ?Chief Complaint  ?Patient presents with  ? Facial Droop  ? Facial Pain  ? ? ?Amy Raymond is a 56 y.o. female. ? ?HPI ? ?55 year old female with a history of hypothyroidism, diabetes, CVA, elevated LFTs, embolic stroke, hypothyroidism, hypertension, who presents to the emergency department today for evaluation of facial pain.  Patient family was concern for stroke.  Her last known well was 9 AM.  Patient underwent colonoscopy and upon waking up she had a pain to her right jaw area and decreased ability to move her mouth.  She reports no vision changes, dizziness, lightheadedness, headaches, unilateral numbness/weakness or problems with coordination. ? ?Home Medications ?Prior to Admission medications   ?Medication Sig Start Date End Date Taking? Authorizing Provider  ?acetaminophen (TYLENOL) 500 MG tablet Take 500-1,000 mg by mouth every 6 (six) hours as needed for mild pain or headache.    [provider]  ?atorvastatin (LIPITOR) 80 MG tablet Take 1 tablet (80 mg total) by mouth daily at 6 PM. 10/06/19   Noralee Stain, DO  ?cholecalciferol (VITAMIN D3) 25 MCG (1000 UNIT) tablet Take 1,000 Units by mouth daily.    [provider]  ?clopidogrel (PLAVIX) 75 MG tablet Take 1 tablet (75 mg total) by mouth daily. ?Patient not taking: Reported on 01/14/2021 10/17/19   Almon Hercules, MD  ?ferrous sulfate 325 (65 FE) MG tablet Take 325 mg by mouth daily with breakfast. ?Patient not taking: Reported on 01/14/2021    [provider]  ?glipiZIDE (GLUCOTROL) 10 MG tablet Take 10 mg by mouth 2 (two) times daily. ?Patient not taking: Reported on 01/14/2021 08/22/19   [provider]  ?levothyroxine (SYNTHROID) 100 MCG tablet Take 1 tablet (100 mcg total) by mouth daily before breakfast. 10/06/19   Noralee Stain, DO  ?linaGLIPtin-metFORMIN HCl 2.12-998 MG TABS Take  1 tablet by mouth 2 (two) times daily.    [provider]  ?lisinopril (ZESTRIL) 10 MG tablet Take 10 mg by mouth daily. 09/28/19   [provider]  ?ondansetron (ZOFRAN) 4 MG tablet Take 1 tablet (4 mg total) by mouth every 6 (six) hours as needed for nausea. ?Patient not taking: Reported on 01/14/2021 09/12/19   Alwyn Ren, MD  ?senna-docusate (SENOKOT-S) 8.6-50 MG tablet Take 1 tablet by mouth at bedtime as needed for mild constipation. ?Patient not taking: Reported on 01/14/2021 10/17/19   Almon Hercules, MD  ?   ? ?Allergies    ?Patient has no known allergies.   ? ?Review of Systems   ?Review of Systems ?See HPI for pertinent positives or negatives. ? ? ?Physical Exam ?Updated Vital Signs ?BP (!) 149/70   Pulse 70   Temp 97.8 ?F (36.6 ?C) (Oral)   Resp 14   SpO2 100%  ?Physical Exam ?Vitals and nursing note reviewed.  ?Constitutional:   ?   General: She is not in acute distress. ?   Appearance: She is well-developed.  ?HENT:  ?   Head: Normocephalic and atraumatic.  ?   Mouth/Throat:  ?   Comments: TTP along the right masseter with muscle spasm noted ?Eyes:  ?   Conjunctiva/sclera: Conjunctivae normal.  ?Cardiovascular:  ?   Rate and Rhythm: Normal rate.  ?Pulmonary:  ?   Effort: Pulmonary effort is normal.  ?Musculoskeletal:     ?   General: Normal range of motion.  ?  Cervical back: Neck supple.  ?Skin: ?   General: Skin is warm and dry.  ?Neurological:  ?   Mental Status: She is alert.  ?   Comments: Mental Status:  ?Alert, thought content appropriate, able to give a coherent history. Speech is somewhat garbled due to decreased ability to move mouth. Able to follow 2 step commands without difficulty.  ?Cranial Nerves:  ?II:  pupils equal, round, reactive to light ?III,IV, VI: ptosis not present, extra-ocular motions intact bilaterally  ?V,VII: smile is asymmetric facial light touch sensation equal ?VIII: hearing grossly normal to voice  ?X: uvula elevates symmetrically  ?XI: bilateral  shoulder shrug symmetric and strong ?XII: midline tongue extension without fassiculations ?Motor:  ?Normal tone. 5/5 strength of BUE and BLE major muscle groups  ?Neg pronator drift ?  ? ? ?ED Results / Procedures / Treatments   ?Labs ?(all labs ordered are listed, but only abnormal results are displayed) ?Labs Reviewed  ?CBC WITH DIFFERENTIAL/PLATELET - Abnormal; Notable for the following components:  ?    Result Value  ? Hemoglobin 10.9 (*)   ? HCT 34.6 (*)   ? MCV 73.6 (*)   ? MCH 23.2 (*)   ? RDW 16.1 (*)   ? All other components within normal limits  ?BASIC METABOLIC PANEL - Abnormal; Notable for the following components:  ? Glucose, Bld 156 (*)   ? Calcium 8.8 (*)   ? All other components within normal limits  ?CBG MONITORING, ED - Abnormal; Notable for the following components:  ? Glucose-Capillary 145 (*)   ? All other components within normal limits  ? ? ?EKG ?None ? ?Radiology ?CT Maxillofacial Wo Contrast ? ?Result Date: 11/01/2021 ?CLINICAL DATA:  56 year old female with possible mandibular dislocation EXAM: CT MAXILLOFACIAL WITHOUT CONTRAST TECHNIQUE: Multidetector CT imaging of the maxillofacial structures was performed. Multiplanar CT image reconstructions were also generated. RADIATION DOSE REDUCTION: This exam was performed according to the departmental dose-optimization program which includes automated exposure control, adjustment of the mA and/or kV according to patient size and/or use of iterative reconstruction technique. COMPARISON:  Head CT 10/09/2019 FINDINGS: Osseous: No mandibular fracture. No fracture of the visualized skull. No zygoma fracture, maxillary fracture, septal fracture. Unremarkable visualized cervical spine. Anterior subluxation/dislocation of the right TMJ. The alignment is unchanged from the comparison head CT 10/09/2019. No associated fracture. Orbits: Bilateral exophthalmos Sinuses: Trace mucosal disease of the right-sided ethmoid air cells. Soft tissues: Unremarkable soft  tissues. Limited intracranial: No acute finding. IMPRESSION: Right-sided TMJ subluxation/dislocation, which appears chronic or recurrent, as this finding was present on head CT 10/09/2019 Electronically Signed   By: Gilmer Mor D.O.   On: 11/01/2021 14:17   ? ?Procedures ?Procedures  ? ? ?Medications Ordered in ED ?Medications  ?diazepam (VALIUM) injection 5 mg (5 mg Intravenous Given 11/01/21 1221)  ?sodium chloride 0.9 % bolus 1,000 mL (0 mLs Intravenous Stopped 11/01/21 1322)  ?diphenhydrAMINE (BENADRYL) injection 25 mg (25 mg Intravenous Given 11/01/21 1346)  ? ? ?ED Course/ Medical Decision Making/ A&P ?  ?                        ?Medical Decision Making ?Amount and/or Complexity of Data Reviewed ?Labs: ordered. ?Radiology: ordered. ? ?Risk ?Prescription drug management. ? ? ?This patient presents to the ED for concern of facial pain and concern for facial droop, this involves an extensive number of treatment options, and is a complaint that carries with it a high risk of  complications and morbidity.  The differential diagnosis includes but is not limited to muscle spasm, jaw dislocation, cva  ? ?Comorbidities that complicate the patient evaluation: ?Patient?s presentation is complicated by their history of cva, hld, dm ? ?Additional history obtained: ?Additional history obtained from family ?Records reviewed Care Everywhere/External Records ? ?Lab Tests: ?I Ordered, and personally interpreted labs.  The pertinent results include:   ?CBC w/o leukocytosis, mild anemia present which is stable ?BMP grossly unremarkable ? ?EKG - unchanged from prior  ? ?Imaging Studies ordered: ?I ordered, independently visualized, and interpreted imaging which showed  ? CT maxillofacial - Right-sided TMJ subluxation/dislocation, which appears chronic or recurrent, as this finding was present on head CT 10/09/2019 ? ?I agree with the radiologist interpretation ? ?Cardiac Monitoring: ?The patient was maintained on a cardiac monitor.   I personally viewed and interpreted the cardiac monitor which showed an underlying rhythm of:  sinus rhythm ? ?Medicines ordered and prescription drug management: ?I ordered medication including valium,

## 2021-11-01 NOTE — Discharge Instructions (Addendum)
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 You were given a referral to oral surgery for further evaluation of this. Please call the office to schedule a follow up appointment.  ? ?Please follow up with your primary care provider within 5-7 days for re-evaluation of your symptoms. If you do not have a primary care provider, information for a healthcare clinic has been provided for you to make arrangements for follow up care. Please return to the emergency department for any new or worsening symptoms. ? ?

## 2021-11-01 NOTE — ED Provider Notes (Incomplete)
Shared visit *** ?

## 2021-11-01 NOTE — ED Notes (Signed)
Patient ambulated to RR with steady gait, reports pain has improved, facial drop slightly improved.  ?

## 2022-07-02 IMAGING — CT CT MAXILLOFACIAL W/O CM
3 series · 15 of 47 positions shown, 18 images · non-contrast
Comparison: Head CT 10/09/2019

CLINICAL DATA: 55-year-old female with possible mandibular
dislocation



[Series 3: max soft · axial · 0.33mm/px · z∈[-206,-56]mm · 9 of 87 slices shown, 12 images]
[im 6/87  brain]
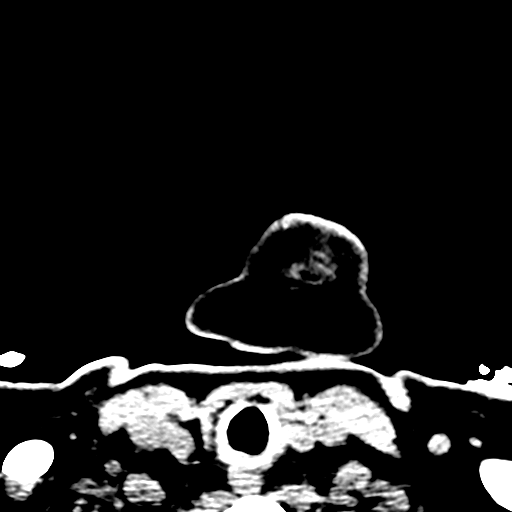
[im 6/87  bone]
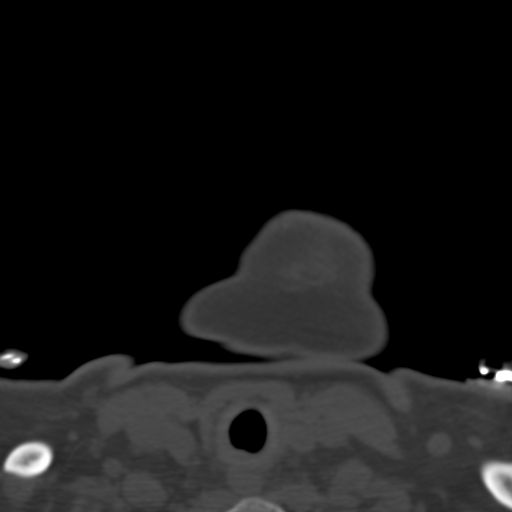
[im 15/87  bone]
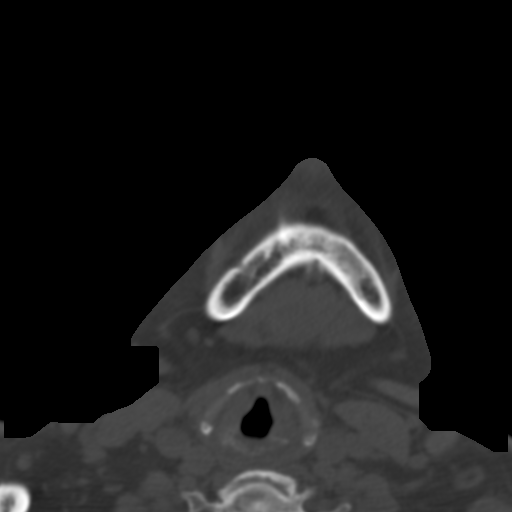
[im 24/87  bone]
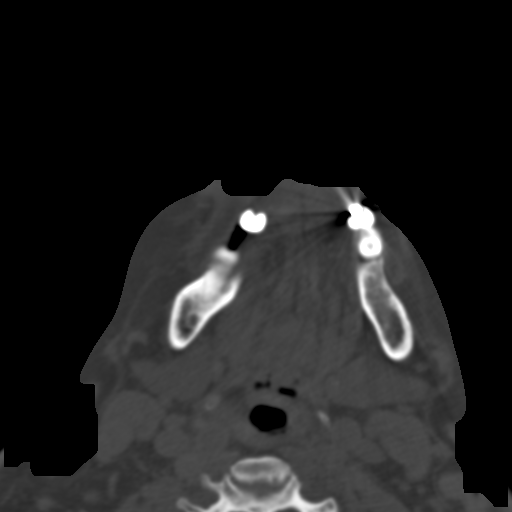
[im 33/87  bone]
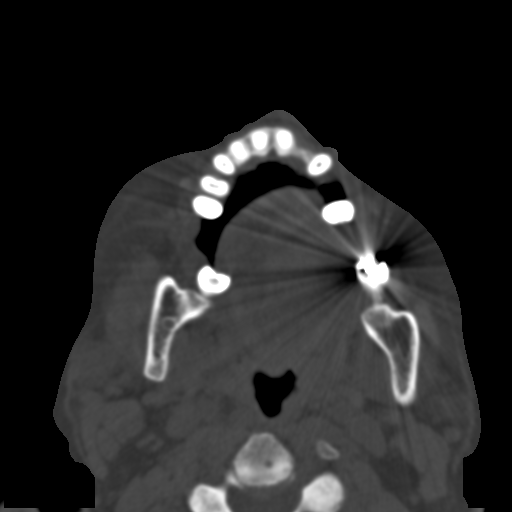
[im 45/87  brain]
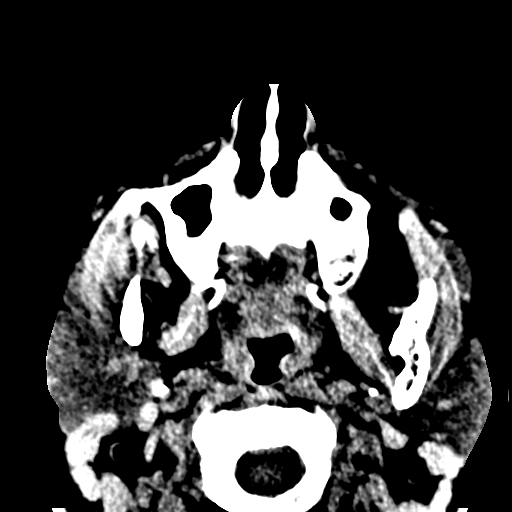
[im 45/87  bone]
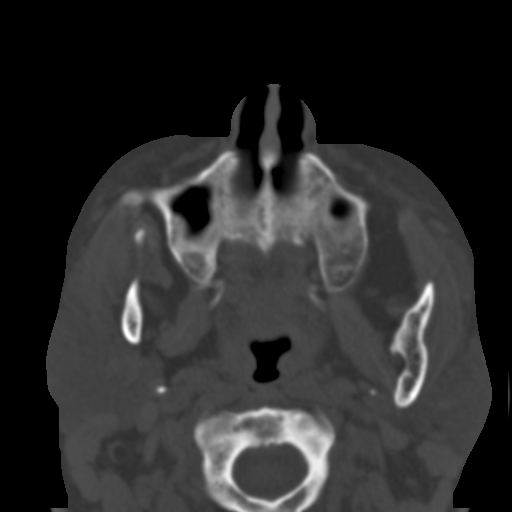
[im 54/87  bone]
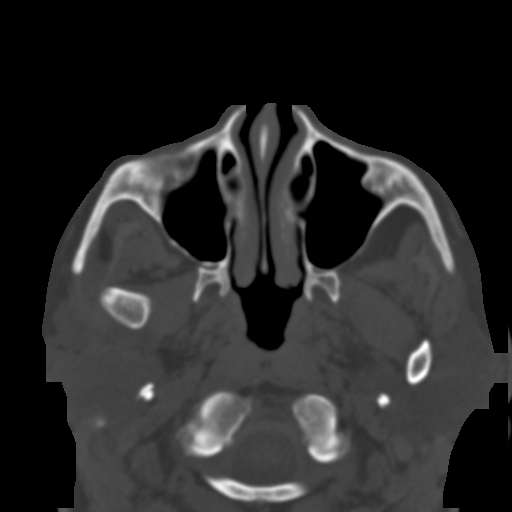
[im 63/87  bone]
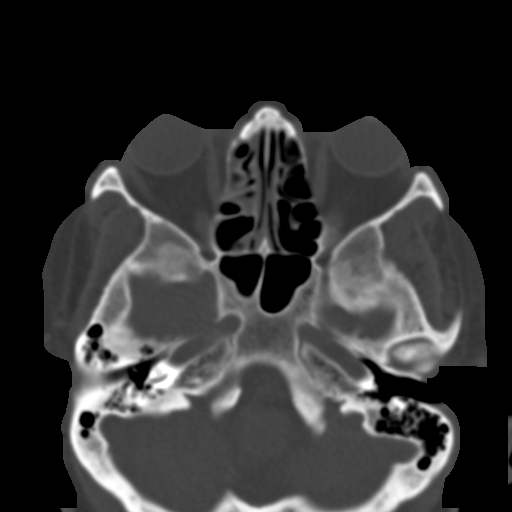
[im 72/87  bone]
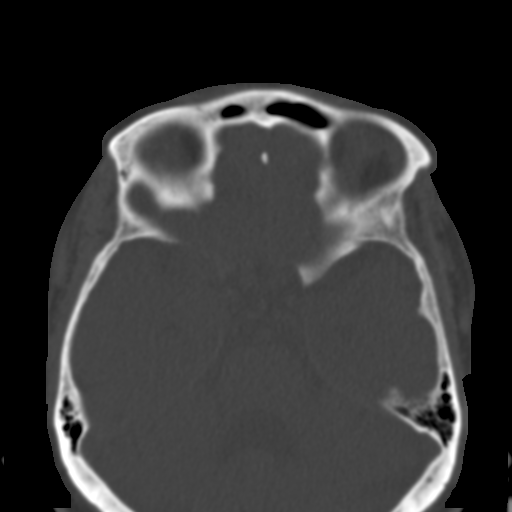
[im 81/87  brain]
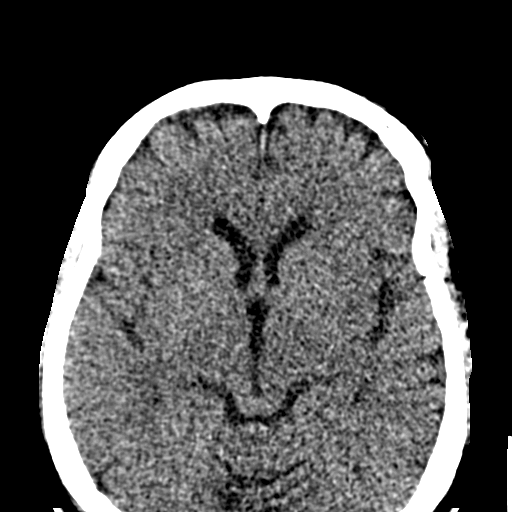
[im 81/87  bone]
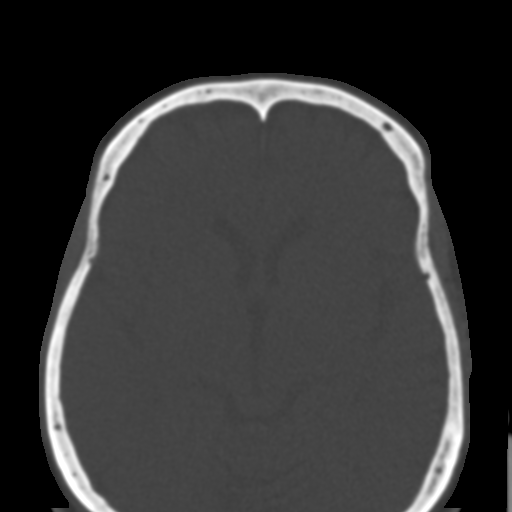

[Series 7: coronal soft · coronal · 0.41mm/px · 3 of 80 slices shown]
[im 27/80  bone]
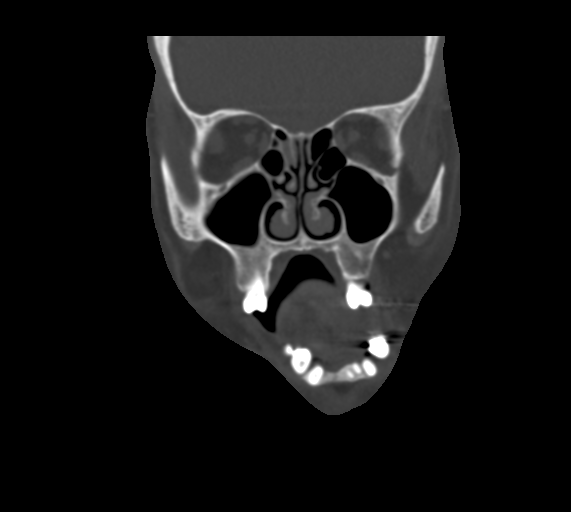
[im 36/80  bone]
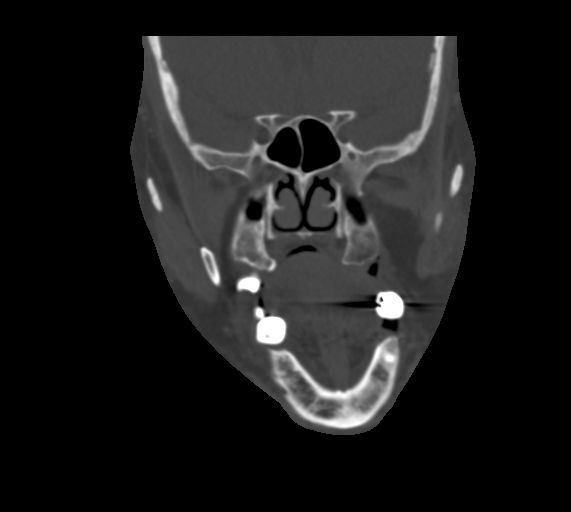
[im 44/80  bone]
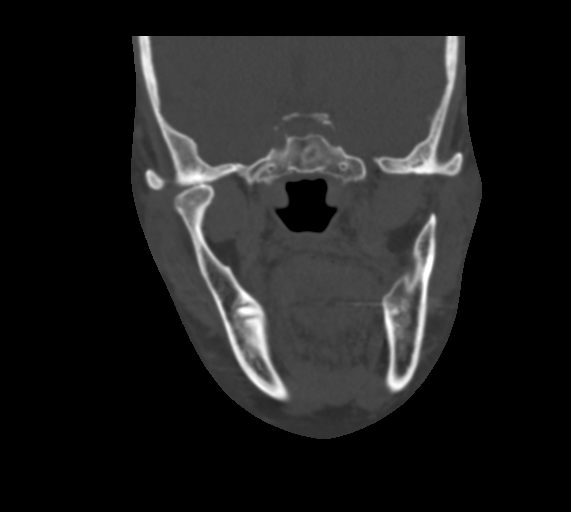

[Series 8: sagittal soft · sagittal · 0.38mm/px · 3 of 83 slices shown]
[im 28/83  bone]
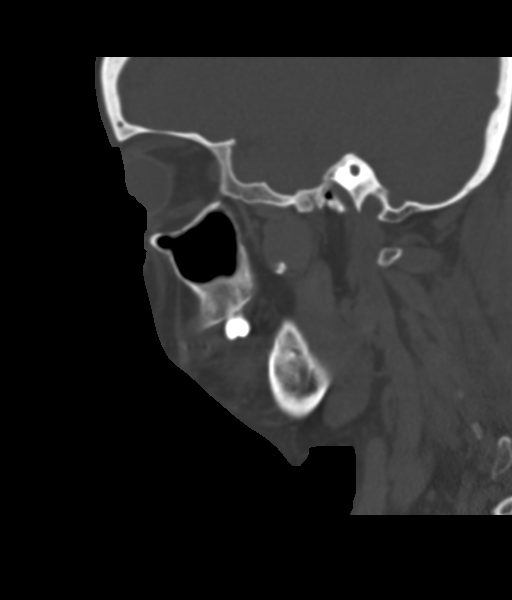
[im 42/83  bone]
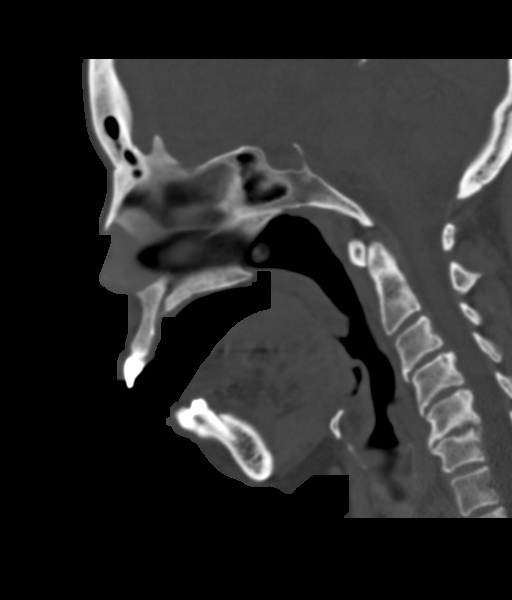
[im 55/83  bone]
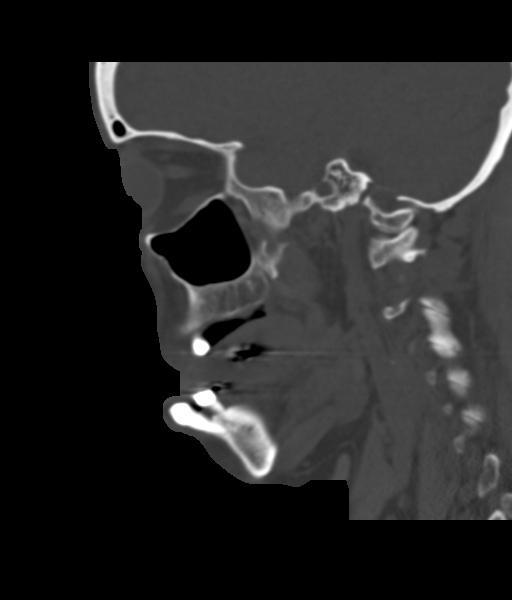

[15 of 47 positions shown; findings below may reference images not displayed]

FINDINGS: Osseous: No mandibular fracture. No fracture of the visualized
skull. No zygoma fracture, maxillary fracture, septal fracture.
Unremarkable visualized cervical spine.

Anterior subluxation/dislocation of the right TMJ. The alignment is
unchanged from the comparison head CT 10/09/2019. No associated
fracture.

Orbits: Bilateral exophthalmos

Sinuses: Trace mucosal disease of the right-sided ethmoid air cells.

Soft tissues: Unremarkable soft tissues.

Limited intracranial: No acute finding.
IMPRESSION: Right-sided TMJ subluxation/dislocation, which appears chronic or
recurrent, as this finding was present on head CT 10/09/2019
